# Patient Record
Sex: Female | Born: 1935 | Race: White | Hispanic: No | Marital: Married | Smoking: Former smoker
Health system: Southern US, Community
[De-identification: ages and names within clinical notes are randomized; demographics above are authoritative.]

## PROBLEM LIST (undated history)

## (undated) DIAGNOSIS — E7800 Pure hypercholesterolemia, unspecified: Secondary | ICD-10-CM

## (undated) DIAGNOSIS — J449 Chronic obstructive pulmonary disease, unspecified: Secondary | ICD-10-CM

## (undated) DIAGNOSIS — I10 Essential (primary) hypertension: Secondary | ICD-10-CM

## (undated) DIAGNOSIS — Z923 Personal history of irradiation: Secondary | ICD-10-CM

## (undated) DIAGNOSIS — C787 Secondary malignant neoplasm of liver and intrahepatic bile duct: Secondary | ICD-10-CM

## (undated) DIAGNOSIS — C801 Malignant (primary) neoplasm, unspecified: Secondary | ICD-10-CM

## (undated) DIAGNOSIS — Z9221 Personal history of antineoplastic chemotherapy: Secondary | ICD-10-CM

## (undated) DIAGNOSIS — R918 Other nonspecific abnormal finding of lung field: Secondary | ICD-10-CM

## (undated) HISTORY — PX: BACK SURGERY: SHX140

## (undated) HISTORY — DX: Chronic obstructive pulmonary disease, unspecified: J44.9

## (undated) HISTORY — DX: Personal history of antineoplastic chemotherapy: Z92.21

## (undated) HISTORY — PX: PORTACATH PLACEMENT: SHX2246

## (undated) HISTORY — DX: Essential (primary) hypertension: I10

## (undated) HISTORY — DX: Pure hypercholesterolemia, unspecified: E78.00

## (undated) HISTORY — PX: ABDOMINAL HYSTERECTOMY: SHX81

## (undated) HISTORY — DX: Personal history of irradiation: Z92.3

---

## 1999-05-07 ENCOUNTER — Encounter: Admission: RE | Admit: 1999-05-07 | Discharge: 1999-05-07 | Payer: Self-pay | Admitting: Gastroenterology

## 1999-05-07 ENCOUNTER — Encounter: Payer: Self-pay | Admitting: Gastroenterology

## 1999-10-21 ENCOUNTER — Encounter: Admission: RE | Admit: 1999-10-21 | Discharge: 1999-10-21 | Payer: Self-pay | Admitting: Gastroenterology

## 1999-10-21 ENCOUNTER — Encounter: Payer: Self-pay | Admitting: Gastroenterology

## 2000-11-02 ENCOUNTER — Encounter: Payer: Self-pay | Admitting: Gastroenterology

## 2000-11-02 ENCOUNTER — Encounter: Admission: RE | Admit: 2000-11-02 | Discharge: 2000-11-02 | Payer: Self-pay | Admitting: Gastroenterology

## 2001-03-22 ENCOUNTER — Other Ambulatory Visit: Admission: RE | Admit: 2001-03-22 | Discharge: 2001-03-22 | Payer: Self-pay

## 2001-05-12 ENCOUNTER — Encounter: Admission: RE | Admit: 2001-05-12 | Discharge: 2001-07-05 | Payer: Self-pay | Admitting: Orthopedic Surgery

## 2001-06-07 ENCOUNTER — Encounter: Admission: RE | Admit: 2001-06-07 | Discharge: 2001-06-07 | Payer: Self-pay

## 2001-12-01 ENCOUNTER — Encounter: Payer: Self-pay | Admitting: Family Medicine

## 2001-12-01 ENCOUNTER — Encounter: Admission: RE | Admit: 2001-12-01 | Discharge: 2001-12-01 | Payer: Self-pay | Admitting: Family Medicine

## 2001-12-08 ENCOUNTER — Encounter: Admission: RE | Admit: 2001-12-08 | Discharge: 2002-01-17 | Payer: Self-pay | Admitting: Family Medicine

## 2002-04-11 ENCOUNTER — Other Ambulatory Visit: Admission: RE | Admit: 2002-04-11 | Discharge: 2002-04-11 | Payer: Self-pay

## 2002-07-11 ENCOUNTER — Encounter: Admission: RE | Admit: 2002-07-11 | Discharge: 2002-07-11 | Payer: Self-pay

## 2003-08-09 ENCOUNTER — Encounter: Admission: RE | Admit: 2003-08-09 | Discharge: 2003-08-09 | Payer: Self-pay | Admitting: Obstetrics and Gynecology

## 2004-10-07 ENCOUNTER — Encounter: Admission: RE | Admit: 2004-10-07 | Discharge: 2004-10-07 | Payer: Self-pay | Admitting: Gastroenterology

## 2005-11-11 ENCOUNTER — Encounter: Admission: RE | Admit: 2005-11-11 | Discharge: 2005-11-11 | Payer: Self-pay | Admitting: Gastroenterology

## 2005-11-19 ENCOUNTER — Encounter: Admission: RE | Admit: 2005-11-19 | Discharge: 2005-11-19 | Payer: Self-pay | Admitting: Gastroenterology

## 2006-09-23 ENCOUNTER — Encounter: Admission: RE | Admit: 2006-09-23 | Discharge: 2006-09-23 | Payer: Self-pay | Admitting: Gastroenterology

## 2006-11-16 ENCOUNTER — Encounter: Admission: RE | Admit: 2006-11-16 | Discharge: 2006-11-16 | Payer: Self-pay | Admitting: Gastroenterology

## 2007-12-20 ENCOUNTER — Encounter: Admission: RE | Admit: 2007-12-20 | Discharge: 2007-12-20 | Payer: Self-pay | Admitting: Internal Medicine

## 2008-06-06 ENCOUNTER — Encounter: Admission: RE | Admit: 2008-06-06 | Discharge: 2008-06-06 | Payer: Self-pay | Admitting: Neurosurgery

## 2008-06-28 ENCOUNTER — Ambulatory Visit (HOSPITAL_COMMUNITY): Admission: RE | Admit: 2008-06-28 | Discharge: 2008-06-29 | Payer: Self-pay | Admitting: Neurosurgery

## 2008-09-19 ENCOUNTER — Ambulatory Visit (HOSPITAL_COMMUNITY): Admission: RE | Admit: 2008-09-19 | Discharge: 2008-09-19 | Payer: Self-pay | Admitting: Internal Medicine

## 2008-11-20 ENCOUNTER — Ambulatory Visit: Payer: Self-pay | Admitting: Vascular Surgery

## 2008-11-29 ENCOUNTER — Ambulatory Visit (HOSPITAL_COMMUNITY): Admission: RE | Admit: 2008-11-29 | Discharge: 2008-11-29 | Payer: Self-pay | Admitting: Vascular Surgery

## 2008-11-29 ENCOUNTER — Ambulatory Visit: Payer: Self-pay | Admitting: Vascular Surgery

## 2008-12-09 ENCOUNTER — Encounter: Payer: Self-pay | Admitting: Vascular Surgery

## 2008-12-09 ENCOUNTER — Inpatient Hospital Stay (HOSPITAL_COMMUNITY): Admission: RE | Admit: 2008-12-09 | Discharge: 2008-12-12 | Payer: Self-pay | Admitting: Vascular Surgery

## 2008-12-10 ENCOUNTER — Encounter: Payer: Self-pay | Admitting: Vascular Surgery

## 2009-01-01 ENCOUNTER — Ambulatory Visit: Payer: Self-pay | Admitting: Vascular Surgery

## 2009-05-28 ENCOUNTER — Ambulatory Visit: Payer: Self-pay | Admitting: Vascular Surgery

## 2009-09-24 ENCOUNTER — Encounter: Admission: RE | Admit: 2009-09-24 | Discharge: 2009-09-24 | Payer: Self-pay | Admitting: Internal Medicine

## 2009-11-28 ENCOUNTER — Encounter: Admission: RE | Admit: 2009-11-28 | Discharge: 2009-11-28 | Payer: Self-pay | Admitting: Gastroenterology

## 2009-12-25 ENCOUNTER — Ambulatory Visit: Payer: Self-pay | Admitting: Vascular Surgery

## 2010-03-19 ENCOUNTER — Ambulatory Visit: Payer: Self-pay | Admitting: Vascular Surgery

## 2010-09-28 LAB — APTT: aPTT: 29 seconds (ref 24–37)

## 2010-09-28 LAB — POCT I-STAT, CHEM 8
BUN: 14 mg/dL (ref 6–23)
Chloride: 106 mEq/L (ref 96–112)
Creatinine, Ser: 0.8 mg/dL (ref 0.4–1.2)
Potassium: 3.7 mEq/L (ref 3.5–5.1)
Sodium: 141 mEq/L (ref 135–145)
TCO2: 25 mmol/L (ref 0–100)

## 2010-09-28 LAB — BASIC METABOLIC PANEL
BUN: 4 mg/dL — ABNORMAL LOW (ref 6–23)
Chloride: 106 mEq/L (ref 96–112)
Creatinine, Ser: 0.8 mg/dL (ref 0.4–1.2)
Glucose, Bld: 127 mg/dL — ABNORMAL HIGH (ref 70–99)

## 2010-09-28 LAB — URINALYSIS, ROUTINE W REFLEX MICROSCOPIC
Bilirubin Urine: NEGATIVE
Nitrite: NEGATIVE
Urobilinogen, UA: 0.2 mg/dL (ref 0.0–1.0)

## 2010-09-28 LAB — COMPREHENSIVE METABOLIC PANEL WITH GFR
ALT: 24 U/L (ref 0–35)
AST: 22 U/L (ref 0–37)
Albumin: 4.2 g/dL (ref 3.5–5.2)
Alkaline Phosphatase: 66 U/L (ref 39–117)
BUN: 10 mg/dL (ref 6–23)
CO2: 25 meq/L (ref 19–32)
Calcium: 10.2 mg/dL (ref 8.4–10.5)
Chloride: 103 meq/L (ref 96–112)
Creatinine, Ser: 0.75 mg/dL (ref 0.4–1.2)
GFR calc non Af Amer: >60 mL/min
Glucose, Bld: 79 mg/dL (ref 70–99)
Potassium: 4.6 meq/L (ref 3.5–5.1)
Sodium: 140 meq/L (ref 135–145)
Total Bilirubin: 0.6 mg/dL (ref 0.3–1.2)
Total Protein: 6.8 g/dL (ref 6.0–8.3)

## 2010-09-28 LAB — TYPE AND SCREEN: Antibody Screen: NEGATIVE

## 2010-09-28 LAB — CBC
MCV: 93.8 fL (ref 78.0–100.0)
RBC: 4.12 MIL/uL (ref 3.87–5.11)
RBC: 4.78 MIL/uL (ref 3.87–5.11)

## 2010-09-28 LAB — ABO/RH: ABO/RH(D): O POS

## 2010-09-28 LAB — PROTIME-INR
INR: 0.9 (ref 0.00–1.49)
Prothrombin Time: 12.3 s (ref 11.6–15.2)

## 2010-10-05 LAB — CBC
HCT: 46 % (ref 36.0–46.0)
Hemoglobin: 15.2 g/dL — ABNORMAL HIGH (ref 12.0–15.0)
MCHC: 33 g/dL (ref 30.0–36.0)
MCV: 93.3 fL (ref 78.0–100.0)
RDW: 13.4 % (ref 11.5–15.5)

## 2010-11-03 NOTE — Assessment & Plan Note (Signed)
OFFICE VISIT   [name], [name]  DOB:  [birth year]                                       03/19/2010  ZOXWR#:[id number]   The patient returns for followup today.  She previously underwent left  common femoral endarterectomy and to left right femoral-femoral bypass  in June of 2010.  She returns today complaining of bilateral numbness,  tingling and burning in her feet each night.  She states that this has  been going on prior to her operation in 2010.  She has been on no prior  treatment for this.  She occasionally has night time cramps but really  denies any claudication symptoms in her lower extremities.  She denies  any rest pain.   REVIEW OF SYSTEMS:  She denies any shortness of breath or chest pain.   PHYSICAL EXAM:  Vital signs:  Blood pressure is 142/82 in the left arm,  heart rate 76 and regular, oxygen saturations 98% on room air.  HEENT:  Unremarkable.  Neck:  Has 2+ carotid pulses without bruit.  Chest:  Clear to auscultation.  Cardiac:  Regular rate and rhythm without  murmur.  Abdomen:  Soft, nontender, nondistended.  No masses.  Extremities:  She has 2+ radial, 2+ femoral and 2+ posterior tibial  pulses bilaterally.   In summary, the patient has bilateral burning sensation in her feet  which sounds consistent with neuropathy.  I started her on Neurontin  today 100 mg three times a day.  This can be titrated up over time if  necessary.  Hopefully this will provide her some relief.  She has  excellent pulse in her femoral-femoral bypass and good pulses in her  feet as well.  I do not believe the burning in her feet is vascular in  origin.  Hopefully the Neurontin will help with her symptoms overall.  She will follow up with a graft scan in July of 2012.     Janetta Hora. Fields, MD  Electronically Signed   CEF/MEDQ  D:  03/19/2010  T:  03/20/2010  Job:  [id number]   cc:   [name], M.D.

## 2010-11-03 NOTE — Op Note (Signed)
[name], [name]                ACCOUNT NO.:  [id number]   MEDICAL RECORD NO.:  [id number]          PATIENT TYPE:  INP   LOCATION:  3310                         FACILITY:  [facility]   PHYSICIAN:  Janetta Hora. Fields, MD  DATE OF BIRTH:  December 06, 1931   DATE OF PROCEDURE:  12/09/2008  DATE OF DISCHARGE:                               OPERATIVE REPORT   PROCEDURE:  1. Left common femoral endarterectomy.  2. Left-to-right femoral-femoral bypass.   PREOPERATIVE DIAGNOSIS:  Claudication, right leg.   POSTOPERATIVE DIAGNOSIS:  Claudication, right leg.   ANESTHESIA:  General.   ASSISTANT:  Jerold Coombe, PA-C   OPERATIVE FINDINGS:  An 8-mm Dacron graft.   SPECIMENS:  1. Plaque from left common femoral artery.  2. Incidental lymph node, left groin.   OPERATIVE DETAILS:  After obtaining informed consent, the patient was  taken to the operating room.  The patient was placed in the supine  position on the operating table.  After induction of general anesthesia  and endotracheal intubation, a Foley catheter was placed.  Next, the  patient was prepped and draped in the usual sterile fashion from the  umbilicus down to the knees bilaterally.  An incision was made in a  curvilinear fashion in the right groin and carried down through the  subcutaneous tissues and down to the level of the artery.  The artery  had no pulse within it on the right side.  There was some  atherosclerotic plaque posteriorly up near the inguinal ligament, but  the artery became softer in character with minimal plaque down near the  femoral bifurcation.  Superficial femoral and profunda femoris arteries  were dissected free circumferentially and vessel loops were placed  around these.  Attention was then turned to the left groin.  In similar  fashion, a curvilinear incision was made on the left groin and carried  down through the subcutaneous tissues and down to the level of the left  common femoral artery.  There  was a large incidental lymph node  overlying this and this was excised to sent pathology as specimen.  Common femoral artery was dissected free circumferentially.  It was a  fairly high takeoff of the profunda femoris artery and this was  dissected free circumferentially and vessel loop was placed around this.  Superficial femoral artery was also dissected free circumferentially and  vessel loop was placed around this.  Next, a subcutaneous tunnel was  created going over the pubic bone superficial to the fascia.  An 8-mm  Dacron graft was then brought through this.  The patient was then given  5000 units of intravenous heparin.  The patient was given an additional  3000 units of heparin during the case.  The left common femoral artery  was controlled proximally with a Cooley clamp.  The profunda femoris and  superficial femoral arteries were controlled with vessel loops.  A  longitudinal opening was made in the left common femoral artery.  There  was a large amount of posterior plaque and this was obstructing  approximately 50% of the lumen.  This side  of the left common femoral  endarterectomy would need to be performed to prevent any narrowing of  the left common femoral artery especially since this was the donor  artery.  Endarterectomy was begun at about the level of the inguinal  ligament and carried down approximately 0.5 cm into the left superficial  femoral artery.  A good endpoint was obtained around the orifice of the  profunda on the left side.  There was a small shelf posteriorly on the  left SFA and this was tacked with several 7-0 Prolene sutures.  Next, a  Dacron patch was made from fashioning a portion of the 8-mm graft.  This  was done by opening the graft longitudinally and trimming the fashion.  This was then sewn with a running 6-0 Prolene suture.  Just prior to  completion of anastomosis, everything was backbled, forebled, and  thoroughly flushed.  Anastomosis was  secured.  A few repair sutures were  placed.  There was good pulse within the common femoral artery around  the left side.  There was good Doppler flow within the superficial  femoral and profunda femoris arteries on the left side.  Next, the  artery was re-controlled in the similar fashion.  A longitudinal opening  was made in the anterior portion of the patch.  An 8-mm Dacron graft was  then sewn end of graft to side of patch using a running 5-0 Prolene  suture.  Just prior to completion of anastomosis, this was forebled,  backbled, and thoroughly flushed.  Anastomosis was secured.  Clamps were  released.  There was good pulsatile flow in the common femoral. profunda  femoris, superficial femoral, and the new femoral-femoral bypass graft  immediately.  Next, attention was turned to the right groin.  The right  common femoral artery was controlled proximally with Cooley clamp.  The  profunda femoris and superficial femoral arteries were controlled with  vessel loops.  A longitudinal opening was made in the right common  femoral artery above the femoral bifurcation.  The graft was then cut to  length and beveled.  The graft was then sewn end graft to side of artery  using a running 5-0 Prolene suture.  Just prior to completion of  anastomosis, this was forebled, backbled, and thoroughly flushed.  Anastomosis was secured.  Clamps were released.  There was pulsatile  flow in the graft immediately.  There was good Doppler flow in the  profunda femoris and superficial femoral artery.  This augmented  approximately 75% with un-clamping of the graft.  Next, hemostasis was  obtained.  The patient was given 50 mg of protamine.  Both groins were  closed with running 2-0 and 3-0 Vicryl suture in the subcutaneous and  deep layers.  Skin was then closed with 4-0 Vicryl subcuticular stitch  bilaterally.  The patient tolerated the procedure well and there were no  complications.  Instrument, sponge, and  needle counts were correct at  the end of the case.  The patient's feet were pink and warm at the end  of the case.  The patient was taken to the recovery room in stable  condition.      Janetta Hora. Fields, MD  Electronically Signed     CEF/MEDQ  D:  12/09/2008  T:  12/10/2008  Job:  [id number]

## 2010-11-03 NOTE — Procedures (Signed)
BYPASS GRAFT EVALUATION   INDICATION:  Follow up femoral-to-femoral bypass graft.   HISTORY:  Diabetes:  No.  Cardiac:  No.  Hypertension:  Yes.  Smoking:  Yes.  Previous Surgery:  Left-to-right femoral-femoral bypass grafts on  12/09/08.   SINGLE LEVEL ARTERIAL EXAM                               RIGHT              LEFT  Brachial:                    172                164  Anterior tibial:             165                169  Posterior tibial:            168                168  Peroneal:  Ankle/brachial index:        0.98               0.98   PREVIOUS ABI:  Date: 01/01/09  RIGHT:  1.04  LEFT:  1.09   LOWER EXTREMITY BYPASS GRAFT DUPLEX EXAM:   DUPLEX:  Patent left-to-right femoral-to-femoral bypass graft with  biphasic waveforms proximal, within, and distal to the graft.   IMPRESSION:  1. Normal ankle brachial indices bilaterally.  2. Patent femoral-to-femoral bypass graft with no focal stenosis noted      within.     ___________________________________________  Janetta Hora. Fields, MD   CB/MEDQ  D:  05/28/2009  T:  05/28/2009  Job:  [id number]

## 2010-11-03 NOTE — Assessment & Plan Note (Signed)
OFFICE VISIT   [name], [name]  DOB:  06/25/1931                                       01/01/2009  PPIRJ#:[id number]   The patient returns for followup today.  She underwent left common  femoral endarterectomy and left to right femoral-femoral bypass on June  21.  She has recovered well from this.  She presents to the office today  for further followup.  She states her incisions are healing well.  She  has a slight separation at the inferior aspect of the left and otherwise  no complaints.  She states that her walking distance has improved and  wants to return to her normal activities.   PHYSICAL EXAM:  Today blood pressure is 149/79 in the right arm, pulse  is 65 and regular.  Both groin incisions are well-healed except for a 1-  2 mm area at the inferior aspect of the left incision which is slightly  separated.  There is no drainage from this.  There was no erythema.  She  has a palpable graft pulse in the femoral-femoral bypass.  Feet are  pink, warm and well-perfused bilaterally.   She had bilateral ABIs performed today which were 1.04 on the right and  1.09 on the left.  This is significantly improved from the 0.65 that she  had on the right preoperatively.   Overall the patient is doing well.  I have told her that she could  return to all of her normal activities at this point.  She will continue  to wash both incisions with soap and water and these should continue to  heal spontaneously.  She will follow up on our graft surveillance  protocol.   Janetta Hora. Fields, MD  Electronically Signed   CEF/MEDQ  D:  01/01/2009  T:  01/02/2009  Job:  [id number]   cc:   [name], M.D.

## 2010-11-03 NOTE — Assessment & Plan Note (Signed)
OFFICE VISIT   [name], [name]  DOB:  [birth year]                                       11/20/2008  ZOXWR#:[id number]   The patient is a 77 year old female referred for right leg pain.  She  stated that the pain started in April of 2010.  She apparently had  lumbar spine surgery 4 months ago and was having numbness, tingling and  pain in her right leg at that time.  However, she stated that the pain  had resolved completely and this is a new pain that has only been  present for the last 2 months.  She currently experiences claudication  symptoms in the right leg at one to two blocks.  She has no rest pain.  She is a current smoker and smokes approximately 3 cigarettes per day.  She is currently cutting back on her smoking and is on a Nicorette gum  program.   Other atherosclerotic risk factors include elevated cholesterol.  She  denies history of diabetes, hypertension or coronary disease.   PAST SURGICAL HISTORY:  She had lumbar spine surgery 4 months ago.  She  has also had a hysterectomy.   PAST MEDICAL HISTORY:  Otherwise fairly unremarkable.   MEDICATIONS:  1. Crestor 10 mg every other day.  2. Aspirin 81 mg once a day.  3. Vitamin D 50,000 units twice a week.  4. Calcium 600 mg once a day.  5. Vitamin C 500 mg once a day.  6. Motrin p.r.n.   ALLERGIES:  She has allergies listed to Aleve and Zyban both of which  cause a rash.   FAMILY HISTORY:  Unremarkable.   SOCIAL HISTORY:  She is married and has two children.  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  Smoking history is as listed  above.  She does not consume alcohol regularly.   REVIEW OF SYSTEMS:  She is 5 feet 4 inches, 147 pounds.  She has had  some...   PHYSICAL EXAMINATION:  Vital signs:  Blood pressure is 153/85 in the  right arm, pulse is 75 and regular.  HEENT:  Unremarkable.  Neck:  Has  2+ carotid pulses without bruit.  Chest:  Clear to auscultation.  Cardiac:   Exam is regular rate and rhythm without murmur.  Abdomen:  Abdomen is soft, nontender, nondistended.  No masses.  Extremities:  She  has 2+ brachial and radial pulses bilaterally.  She has a 2+ left  femoral, popliteal, dorsalis pedis and posterior tibial pulse.  She has  absent right femoral, popliteal, posterior tibial and dorsalis pedis  pulse.  Both feet are pink, warm and well-perfused.   She had bilateral ABIs performed today which were 1.08 on the left and  0.65 on the right.   The patient has an exam consistent with iliac occlusive disease on the  right side.  She currently has short distance claudication symptoms.  I  believe the best option for her would be abdominal aortogram/lower  extremity runoff, possible angioplasty and stenting of her iliac artery  if necessary.  Risks, benefits, possible complications and procedure  details were explained to the patient today for aortogram/lower  extremity runoff and possible angioplasty and stenting.  She understands  and agrees to proceed.  She apparently had a family reunion coming up  soon.  She will either have her arteriogram on June 4 or June 11 and  will call us back later today regarding that.   Janetta Hora. Fields, MD  Electronically Signed   CEF/MEDQ  D:  11/20/2008  T:  11/21/2008  Job:  [id number]   cc:   [name], M.D.

## 2010-11-03 NOTE — Op Note (Signed)
[name], MAULDEN                ACCOUNT NO.:  [id number]   MEDICAL RECORD NO.:  [id number]          PATIENT TYPE:  AMB   LOCATION:  SDS                          FACILITY:  [facility]   PHYSICIAN:  Donalee Citrin, M.D.        DATE OF BIRTH:  1931-10-27   DATE OF PROCEDURE:  06/28/2008  DATE OF DISCHARGE:                               OPERATIVE REPORT   PREOPERATIVE DIAGNOSES:  Lumbar spinal stenosis with right-sided L4  radiculopathy and spinal stenosis at L3-4.   PROCEDURE:  Decompressive laminectomy at L3-4 with microdissection of  the right L4 nerve root, microscopic diskectomy.   SURGEON:  Donalee Citrin, MD   ASSISTANT:  Kathaleen Maser. Pool, MD   ANESTHESIA:  General endotracheal.   HISTORY OF PRESENT ILLNESS:  The patient is a very pleasant 77 year old  female who has had several years of progressive worsening back and  predominantly right leg pain radiating through her hip down the outside  of her right thigh to the front of her right shin, consistent with L4  nerve root pattern.  The patient's failed all forms of conservative  treatment epidural steroid injection, physical therapy, and time.  An  MRI scan showed progression with disk herniation and severe facet  arthropathy, and synovial cyst at L3-4 on the right.  The patient  entirely right-sided symptoms and was recommended a unilateral  decompressive laminectomy.  Risks and benefits of the operation were  discussed; the patient understood and agreed to proceed forward.   DESCRIPTION OF PROCEDURE:  The patient was brought to the OR and induced  under anesthesia, positioned prone on the Wilson frame.  Back was  prepped and draped in the routine sterile fashion.  Preop incision was  localized at the appropriate level.  So after infiltration of 10 mL of  lidocaine with epinephrine, a midline incision was made.  Bovie  electrocautery was used to take down and subperiosteal dissection was  carried down to the lamina of L3 and L4.  However,  intraoperative x-ray  confirmed localization of this to be at 2 and 3 disk space.  Attention  was then taken to 1 disk space below this and the retractor was  repositioned and a high speed drill was used to drill down to the  inferior aspect level of L3.  Medial facet complexes and spinous process  at L3 was actually removed due to the fact the synovial cyst extended up  to the midline and was felt to be safer to remove the spinous process.  So, central decompression was began to identify the ligamentum flavum  and then extension was taken out laterally and dividing the medial facet  complex.  Then, nerve hook was used to remove the ligamentum flavum  __________ thecal sac.  The thecal sac was remarkably redundant,  hypertrophied and floppy.  This was teased away from a degenerative  synovial cyst on the right.  This was all removed in piecemeal fashion  and the ligament was underbitten and aggressively to gain access to  lateral margin of the disk space.  The disk space  was then inspected and  noted to be bulging and partially herniated, so this was incised with  #15-blade scalpel and the disk space was cleaned out radically with  pituitary rongeurs.  Then, at the end of the diskectomy, the remainder  of the lateral canal, L4 nerve root, and L4 pedicle was identified.  The  L4 nerve root was widely decompressed, explored with a coronary dilator  and angled hockey stick and noted to be widely patent.  The wound was  then copiously irrigated.  Meticulous hemostasis was maintained.  Gelfoam was laid on top of the dura.  The muscle fascia reapproximated  in  layers with interrupted Vicryl, and the skin was closed with a running 4-  0 subcuticular.  Benzoin and Steri-Strips were applied.  The patient  went to the recovery room in stable condition.  At the end of the case,  sponge and instrument count were correct.           ______________________________  Donalee Citrin, M.D.     GC/MEDQ   D:  06/28/2008  T:  06/28/2008  Job:  [id number]

## 2010-11-03 NOTE — Op Note (Signed)
[name], [name]                ACCOUNT NO.:  [id number]   MEDICAL RECORD NO.:  [id number]          PATIENT TYPE:  AMB   LOCATION:  SDS                          FACILITY:  [facility]   PHYSICIAN:  Janetta Hora. Fields, MD  DATE OF BIRTH:  1931/12/30   DATE OF PROCEDURE:  11/29/2008  DATE OF DISCHARGE:  11/29/2008                               OPERATIVE REPORT   PREOPERATIVE DIAGNOSIS:  Claudication, right leg.   POSTOPERATIVE DIAGNOSIS:  Claudication, right leg.   ANESTHESIA:  Local.   OPERATIVE DETAILS:  After obtaining informed consent, the patient was  taken to the PV lab.  The patient was placed supine position on the  angio table.  Both groins were prepped and draped in usual sterile  fashion.  Local anesthesia was infiltrated over the left common femoral  artery.  An introducer needle was used to cannulate the left common  femoral artery and an 0.035 Wholey wire was advanced into the abdominal  aorta under fluoroscopic guidance.  Next, a 5-French sheath was placed  over the guidewire in the left common femoral artery.  This was  thoroughly flushed with heparinized saline.  A 5-French pigtail catheter  was then placed over the guidewire and on the abdominal aorta.  Abdominal aortogram was obtained.  This shows a patent infrarenal  abdominal aorta.  There was mild right renal artery stenosis with  approximately 25% stenosis near the origin.  The left renal artery has a  50% diffuse tubular stenosis approximately its first 2 cm.  There was  some calcification at the aortic bifurcation.  Next, the pigtail  catheter was pulled down to the level of the aortic bifurcation and a  pelvic angiogram was obtained.  This shows occlusion of the right common  iliac artery approximately 1 cm after its origin.  The left common iliac  artery was patent.  The left external and internal iliac artery was  patent.  The right external and internal iliac artery were patent and  fills via pelvic  collaterals.  Lower extremity runoff views were also  performed.  This shows the left and right common femoral, profunda  femoris, and superficial femoral arteries were all widely patent.  Popliteal artery was patent bilaterally.  There was three-vessel runoff  to the feet bilaterally.  Although, the tibial vessels were poorly  opacified on the right leg most likely secondary to limited flow from  the chronic right common iliac artery occlusion.   Next, the pigtail catheter was pulled back over a guidewire.  The 5-  French sheath was left in place to be pulled on the holding area.  This  was thoroughly flushed with heparinized saline prior to leaving the PV  lab.   OPERATIVE FINDINGS:  1. Right common iliac artery occlusion.  2. Three-vessel runoff bilaterally.  3. Bilateral renal artery stenoses, right 25% and left 50%.      Janetta Hora. Fields, MD  Electronically Signed    CEF/MEDQ  D:  11/29/2008  T:  11/30/2008  Job:  [id number]

## 2010-11-03 NOTE — Discharge Summary (Signed)
[name], [name]                ACCOUNT NO.:  [id number]   MEDICAL RECORD NO.:  [id number]          PATIENT TYPE:  INP   LOCATION:  2028                         FACILITY:  [facility]   PHYSICIAN:  Janetta Hora. Fields, MD  DATE OF BIRTH:  16-Sep-1931   DATE OF ADMISSION:  12/09/2008  DATE OF DISCHARGE:  12/12/2008                               DISCHARGE SUMMARY   FINAL DISCHARGE DIAGNOSES:  1. Peripheral vascular disease with left iliac-occlusive disease on      the right.  2. Lumbar spine surgery 4 months ago.  3. History of hysterectomy.  4. Tobacco abuse.  5. Dyslipidemia.   PROCEDURES PERFORMED:  Left common femoral endarterectomy and left-to-  right femoral-femoral bypass grafting by Dr. Darrick Penna on December 09, 2008.   COMPLICATIONS:  None.   CONDITION AT DISCHARGE:  Stable, improving.   DISCHARGE MEDICATIONS:  She is instructed to resume all previously  taking medications consisting of,  1. Crestor 10 mg q.p.m.  2. Vitamin D 50,000 International Units p.o. q.p.m.  3. Vitamin C 500 mg p.o. q.p.m.  4. Aspirin 81 mg p.o. q.a.m.  5. Motrin p.r.n.  6. Calcium plus D q.p.m.  7. Reclast 1 year.  8. Percocet 5/325 one p.o. q.4 h. p.r.n. pain, total #30 were given.   DISPOSITION:  She is being discharged home in stable condition with her  wounds healing well.  She is given careful instructions regarding the  care of her wounds and activity level.  She is scheduled to see Dr.  Darrick Penna in 2 weeks for followup.  She will have the PABIs done at that  time.  The office will make the arrangements for the visit.   BRIEF IDENTIFYING STATEMENT:  For complete details, please refer to the  typed history and physical.  Briefly, this very pleasant 77 year old  woman was referred to Dr. Arbie Cookey for right leg claudication.  He  evaluated her and found her to be a suitable candidate for a left-to-  right femoral-femoral bypass grafting.  She was informed of the risks  and benefits of the procedure and  after careful consideration, elected  to proceed with surgery.   HOSPITAL COURSE:  Preoperative workup was completed as an outpatient.  She was brought in through same-day surgery and underwent the  aforementioned revascularization procedure.  For complete details,  please refer the typed operative report.  The procedure was without  complications.  She was returned to the Post Anesthesia Care Unit and  extubated.  Following stabilization, she was transferred to a bed on a  surgical step-down unit.  She was observed overnight.  She was stable  during this period and was transferred to a bed on a surgical  convalescent floor.   Following transfer to the floor, her activity level was increased as  tolerated.  The following day, she was walking and improving with  physical therapy.  She was desirous of discharge.  She was found to be  stable and was discharged home.      Wilmon Arms, PA      Janetta Hora. Darrick Penna, MD  Electronically Signed    KEL/MEDQ  D:  12/12/2008  T:  12/12/2008  Job:  [id number]

## 2010-11-11 ENCOUNTER — Other Ambulatory Visit: Payer: Self-pay | Admitting: Internal Medicine

## 2010-11-11 DIAGNOSIS — Z1231 Encounter for screening mammogram for malignant neoplasm of breast: Secondary | ICD-10-CM

## 2010-11-30 ENCOUNTER — Ambulatory Visit
Admission: RE | Admit: 2010-11-30 | Discharge: 2010-11-30 | Disposition: A | Discharge status: Home / Self Care | Payer: Medicare Other | Source: Ambulatory Visit | Attending: Internal Medicine | Admitting: Internal Medicine

## 2010-11-30 DIAGNOSIS — Z1231 Encounter for screening mammogram for malignant neoplasm of breast: Secondary | ICD-10-CM

## 2010-12-15 ENCOUNTER — Other Ambulatory Visit: Payer: Self-pay | Admitting: Internal Medicine

## 2010-12-15 DIAGNOSIS — Z0189 Encounter for other specified special examinations: Secondary | ICD-10-CM

## 2010-12-17 ENCOUNTER — Ambulatory Visit
Admission: RE | Admit: 2010-12-17 | Discharge: 2010-12-17 | Disposition: A | Discharge status: Home / Self Care | Payer: Medicare Other | Source: Ambulatory Visit | Attending: Internal Medicine | Admitting: Internal Medicine

## 2010-12-17 ENCOUNTER — Inpatient Hospital Stay: Admission: RE | Admit: 2010-12-17 | Payer: Medicare Other | Source: Ambulatory Visit

## 2010-12-17 DIAGNOSIS — Z0189 Encounter for other specified special examinations: Secondary | ICD-10-CM

## 2010-12-17 MED ORDER — IOHEXOL 300 MG/ML  SOLN
75.0000 mL | Freq: Once | INTRAMUSCULAR | Status: AC | PRN
  Administered 2010-12-17: 75 mL via INTRAVENOUS

## 2010-12-20 DIAGNOSIS — C801 Malignant (primary) neoplasm, unspecified: Secondary | ICD-10-CM

## 2010-12-20 HISTORY — DX: Malignant (primary) neoplasm, unspecified: C80.1

## 2010-12-21 ENCOUNTER — Encounter (INDEPENDENT_AMBULATORY_CARE_PROVIDER_SITE_OTHER): Payer: Medicare Other

## 2010-12-21 DIAGNOSIS — I70219 Atherosclerosis of native arteries of extremities with intermittent claudication, unspecified extremity: Secondary | ICD-10-CM

## 2010-12-21 DIAGNOSIS — Z48812 Encounter for surgical aftercare following surgery on the circulatory system: Secondary | ICD-10-CM

## 2010-12-24 ENCOUNTER — Encounter: Payer: Medicare Other | Admitting: Internal Medicine

## 2010-12-24 ENCOUNTER — Encounter (INDEPENDENT_AMBULATORY_CARE_PROVIDER_SITE_OTHER): Payer: Medicare Other

## 2010-12-24 ENCOUNTER — Other Ambulatory Visit: Payer: Self-pay | Admitting: Thoracic Surgery

## 2010-12-24 DIAGNOSIS — D381 Neoplasm of uncertain behavior of trachea, bronchus and lung: Secondary | ICD-10-CM

## 2010-12-24 DIAGNOSIS — R918 Other nonspecific abnormal finding of lung field: Secondary | ICD-10-CM

## 2010-12-24 NOTE — Letter (Signed)
December 24, 2010  [name], MD [address] [place], [place] [address]  Re:  [name], [name]                DOB:  1932/04/20  Dear Dr. Clelia Croft:  I saw the patient in the office today.  This 77 year old patient was found to have no abnormality, left upper lobe.  She quit smoking 3 years ago, but smoked for many years.  The CT scan revealed a lesion in the left upper lobe posterior segment that was new and was 2.5 x 2.4 lesion. It was in the inferior aspect of the left upper lobe.  She has a 1.1 precarinal node and another mediastinal node that is 1.1 cm.  She had no fever, chills, excessive sputum.  PAST MEDICAL HISTORY:  Significant for hypertension, osteoporosis, chronic obstructive pulmonary disease, and hypercholesterolemia.  Her medications include losartan, hydrochlorothiazide, tramadol, Wellbutrin, Aleve, and vitamin D, although ALEVE causes to have a rash.  FAMILY HISTORY:  Noncontributory.  SOCIAL HISTORY:  She is married, comes in today with her husband.  Quit smoking 3 years ago, had 50 pack year history.  She is not drinking alcohol on a regular basis.  REVIEW OF SYSTEMS:  She is 158 pounds, she is 5 feet 4 inches. GENERAL:  Her weight has been stable. CARDIAC:  She has shortness of breath with exertion. PULMONARY:  See history of present illness. GI:  Reflux. GU:  No kidney disease, dysuria, or frequent urination. VASCULAR:  No claudication, DVT, or TIAs. NEUROLOGICAL:  No dizziness, headaches, blackouts, seizures. MUSCULOSKELETAL:  Arthritis. PSYCHIATRIC:  No depression or nervous. EYE AND ENT:  She has cataracts and will have surgery.  No change in her hearing. HEMATOLOGICAL:  No problems with bleeding, clotting disorders, or anemia.  PHYSICAL EXAMINATION:  Her blood pressure is 113/70, pulse 81, respirations 16, sats were 95%, her height is 5 feet 3 inches, 158 pounds.  Head, eyes, ears, nose, and throat are unremarkable except for the cataracts.  Eyes:   Pupils equal and reactive to light and accommodation.  Neck is supple without thyromegaly.  There is no supraclavicular or axillary adenopathy.  Chest is clear to auscultation and percussion.  Heart:  Regular sinus rhythm.  No murmurs.  Abdomen: Soft.  There is no hepatosplenomegaly.  Extremities:  Pulses are 2+. There is no clubbing or edema.  Neurological:  She is oriented x3. Sensory and motor intact.  Cranial nerves intact.  I feel that the patient is to be worked for non-small cell lung cancer, it is a fairly difficult place to biopsy, but we will proceed with the bronchoscopy, we will proceed with a PET scan, brain scan, and pulmonary function test with DLCO and based upon this, I hope to be able to do fibrobronchoscopy with endobronchial ultrasound and/or electromagnetic navigation or possible mediastinoscopy.  I appreciate the opportunity of seeing the patient.  Sincerely,  Ines Bloomer, M.D. Electronically Signed  DPB/MEDQ  D:  12/24/2010  T:  12/24/2010  Job:  [id number]

## 2010-12-29 ENCOUNTER — Ambulatory Visit (HOSPITAL_COMMUNITY)
Admission: RE | Admit: 2010-12-29 | Discharge: 2010-12-29 | Disposition: A | Discharge status: Home / Self Care | Payer: Medicare Other | Source: Ambulatory Visit | Attending: Thoracic Surgery | Admitting: Thoracic Surgery

## 2010-12-29 ENCOUNTER — Encounter (HOSPITAL_COMMUNITY): Payer: Self-pay

## 2010-12-29 ENCOUNTER — Encounter (HOSPITAL_COMMUNITY)
Admission: RE | Admit: 2010-12-29 | Discharge: 2010-12-29 | Disposition: A | Discharge status: Home / Self Care | Payer: Medicare Other | Source: Ambulatory Visit | Attending: Thoracic Surgery | Admitting: Thoracic Surgery

## 2010-12-29 DIAGNOSIS — R918 Other nonspecific abnormal finding of lung field: Secondary | ICD-10-CM

## 2010-12-29 DIAGNOSIS — R599 Enlarged lymph nodes, unspecified: Secondary | ICD-10-CM | POA: Insufficient documentation

## 2010-12-29 DIAGNOSIS — K5730 Diverticulosis of large intestine without perforation or abscess without bleeding: Secondary | ICD-10-CM | POA: Insufficient documentation

## 2010-12-29 DIAGNOSIS — I672 Cerebral atherosclerosis: Secondary | ICD-10-CM | POA: Insufficient documentation

## 2010-12-29 DIAGNOSIS — J984 Other disorders of lung: Secondary | ICD-10-CM | POA: Insufficient documentation

## 2010-12-29 DIAGNOSIS — J438 Other emphysema: Secondary | ICD-10-CM | POA: Insufficient documentation

## 2010-12-29 DIAGNOSIS — I2510 Atherosclerotic heart disease of native coronary artery without angina pectoris: Secondary | ICD-10-CM | POA: Insufficient documentation

## 2010-12-29 HISTORY — DX: Other nonspecific abnormal finding of lung field: R91.8

## 2010-12-29 MED ORDER — FLUDEOXYGLUCOSE F - 18 (FDG) INJECTION
18.8000 | Freq: Once | INTRAVENOUS | Status: AC | PRN
  Administered 2010-12-29: 18.8 via INTRAVENOUS

## 2010-12-29 MED ORDER — IOHEXOL 300 MG/ML  SOLN
100.0000 mL | Freq: Once | INTRAMUSCULAR | Status: AC | PRN
  Administered 2010-12-29: 100 mL via INTRAVENOUS

## 2010-12-30 ENCOUNTER — Other Ambulatory Visit: Payer: Self-pay | Admitting: Thoracic Surgery

## 2010-12-30 ENCOUNTER — Ambulatory Visit (HOSPITAL_COMMUNITY)
Admission: RE | Admit: 2010-12-30 | Discharge: 2010-12-30 | Disposition: A | Discharge status: Home / Self Care | Payer: Medicare Other | Source: Ambulatory Visit | Attending: Thoracic Surgery | Admitting: Thoracic Surgery

## 2010-12-30 ENCOUNTER — Encounter (HOSPITAL_COMMUNITY)
Admission: RE | Admit: 2010-12-30 | Discharge: 2010-12-30 | Disposition: A | Discharge status: Home / Self Care | Payer: Medicare Other | Source: Ambulatory Visit | Attending: Thoracic Surgery | Admitting: Thoracic Surgery

## 2010-12-30 DIAGNOSIS — Z01812 Encounter for preprocedural laboratory examination: Secondary | ICD-10-CM | POA: Insufficient documentation

## 2010-12-30 DIAGNOSIS — Z01811 Encounter for preprocedural respiratory examination: Secondary | ICD-10-CM

## 2010-12-30 DIAGNOSIS — Z01818 Encounter for other preprocedural examination: Secondary | ICD-10-CM | POA: Insufficient documentation

## 2010-12-30 DIAGNOSIS — J984 Other disorders of lung: Secondary | ICD-10-CM | POA: Insufficient documentation

## 2010-12-30 LAB — COMPREHENSIVE METABOLIC PANEL
BUN: 14 mg/dL (ref 6–23)
Calcium: 9.5 mg/dL (ref 8.4–10.5)
GFR calc Af Amer: >60 mL/min (ref >60)
Glucose, Bld: 98 mg/dL (ref 70–99)
Total Protein: 6.9 g/dL (ref 6.0–8.3)

## 2010-12-30 LAB — TYPE AND SCREEN: ABO/RH(D): O POS

## 2010-12-30 LAB — SURGICAL PCR SCREEN: MRSA, PCR: NEGATIVE

## 2010-12-30 LAB — CBC
MCV: 88.2 fL (ref 78.0–100.0)
Platelets: 259 10*3/uL (ref 150–400)
RBC: 4.93 MIL/uL (ref 3.87–5.11)
WBC: 8.4 10*3/uL (ref 4.0–10.5)

## 2010-12-30 LAB — PROTIME-INR: Prothrombin Time: 12.9 seconds (ref 11.6–15.2)

## 2011-01-01 ENCOUNTER — Other Ambulatory Visit: Payer: Self-pay | Admitting: Thoracic Surgery

## 2011-01-01 ENCOUNTER — Ambulatory Visit (HOSPITAL_COMMUNITY): Payer: Medicare Other

## 2011-01-01 ENCOUNTER — Ambulatory Visit (HOSPITAL_COMMUNITY)
Admission: RE | Admit: 2011-01-01 | Discharge: 2011-01-01 | Disposition: A | Discharge status: Home / Self Care | Payer: Medicare Other | Source: Ambulatory Visit | Attending: Thoracic Surgery | Admitting: Thoracic Surgery

## 2011-01-01 DIAGNOSIS — C3410 Malignant neoplasm of upper lobe, unspecified bronchus or lung: Secondary | ICD-10-CM

## 2011-01-01 DIAGNOSIS — Z01812 Encounter for preprocedural laboratory examination: Secondary | ICD-10-CM | POA: Insufficient documentation

## 2011-01-01 DIAGNOSIS — R222 Localized swelling, mass and lump, trunk: Secondary | ICD-10-CM | POA: Insufficient documentation

## 2011-01-01 DIAGNOSIS — Z87891 Personal history of nicotine dependence: Secondary | ICD-10-CM | POA: Insufficient documentation

## 2011-01-01 DIAGNOSIS — Z01818 Encounter for other preprocedural examination: Secondary | ICD-10-CM | POA: Insufficient documentation

## 2011-01-01 DIAGNOSIS — I10 Essential (primary) hypertension: Secondary | ICD-10-CM | POA: Insufficient documentation

## 2011-01-01 DIAGNOSIS — R599 Enlarged lymph nodes, unspecified: Secondary | ICD-10-CM | POA: Insufficient documentation

## 2011-01-04 NOTE — Op Note (Signed)
  NAMECRISTA, NUON                ACCOUNT NO.:  [id number]  MEDICAL RECORD NO.:  [id number]  LOCATION:                                 FACILITY:  PHYSICIAN:  Ines Bloomer, M.D. DATE OF BIRTH:  [birth year]  DATE OF PROCEDURE: DATE OF DISCHARGE:                              OPERATIVE REPORT   PREOPERATIVE DIAGNOSIS:  Right upper lobe mass with mediastinal adenopathy.  POSTOPERATIVE DIAGNOSIS:  Probable stage IIIA non-small cell lung cancer.  SURGEON:  Ines Bloomer, MD  General anesthesia.  OPERATION PERFORMED:  Fiberoptic bronchoscopy with endobronchial ultrasound and electromagnetic navigation.  After general anesthesia, the video bronchoscope was passed through the endotracheal tube across in the midline.  The left mainstem, left upper lobe, and left lower lobe orifices were normal.  The right mainstem, right upper lobe, right middle lobe, and right lower lobe orifices were normal.  No endobronchial lesions were seen.  We then proceeded with passing the endobronchial ultrasound scope and identified a 7, 10R, and a 4R nodes.  We did 6 passes by passing the sheath of the working channel so that we could see it and then under endoscopic ultrasonic guidance, biopsying the 4R, 10R, and 7 nodes doing 2 aspirations of each.  We then put the needle back into the sheath and removed the needle with the sheath.  After this had been done, we then did automatic registration with the locatable guide probe with electromagnetic navigation.  We navigated to the right upper lobe into the anterior segment and then got within 1.2-1.3 cm of the lesion and did multiple brushings, at least 4 brushings, one needle aspirate of her lesion and as well as biopsies of the lesion.  The patient tolerated the procedures.  Actually, this had been done.  We then removed the locatable guide with the extended working channel and then removed the scope.  The patient returned to recovery room in  stable condition.     Ines Bloomer, M.D.     DPB/MEDQ  D:  01/01/2011  T:  01/02/2011  Job:  [id number]  Electronically Signed by Jovita Gamma M.D. on 01/04/2011 04:03:40 PM

## 2011-01-05 ENCOUNTER — Ambulatory Visit (INDEPENDENT_AMBULATORY_CARE_PROVIDER_SITE_OTHER): Payer: Medicare Other | Admitting: Thoracic Surgery

## 2011-01-05 DIAGNOSIS — C3490 Malignant neoplasm of unspecified part of unspecified bronchus or lung: Secondary | ICD-10-CM

## 2011-01-05 NOTE — Letter (Signed)
January 05, 2011  [name], MD [address] [place], [place] [address]  Re:  [name], [name]                DOB:  Jan 16, 1932  Dear Gala Romney,  I appreciate the opportunity of seeing the patient.  She unfortunately has a stage IIIA non-small-cell adenocarcinoma.  We got a PET scan and brain scan and she underwent bronchoscopy with endobronchial ultrasound as well as electromagnetic navigation and the biopsies were all positive for adenocarcinoma.  We have sent for EGFR and ALK testing.  Her blood pressure is 120/70, pulse 86, respirations 18, and sats were 90%.  I informed her family of the diagnosis and recommended that she get radiation and chemotherapy.  She will return on Thursday for our [facility] to see Dr. Arbutus Ped as well as one of the radiation oncologists.  I appreciate the opportunity of seeing the patient.  Sincerely,  Ines Bloomer, M.D. Electronically Signed  DPB/MEDQ  D:  01/05/2011  T:  01/05/2011  Job:  [id number]

## 2011-01-07 ENCOUNTER — Ambulatory Visit (HOSPITAL_BASED_OUTPATIENT_CLINIC_OR_DEPARTMENT_OTHER): Payer: Medicare Other | Admitting: Internal Medicine

## 2011-01-07 DIAGNOSIS — C3430 Malignant neoplasm of lower lobe, unspecified bronchus or lung: Secondary | ICD-10-CM

## 2011-01-08 ENCOUNTER — Ambulatory Visit
Admission: RE | Admit: 2011-01-08 | Discharge: 2011-01-08 | Disposition: A | Discharge status: Home / Self Care | Payer: Medicare Other | Source: Ambulatory Visit | Attending: Radiation Oncology | Admitting: Radiation Oncology

## 2011-01-08 DIAGNOSIS — R5381 Other malaise: Secondary | ICD-10-CM | POA: Insufficient documentation

## 2011-01-08 DIAGNOSIS — Z510 Encounter for antineoplastic radiation therapy: Secondary | ICD-10-CM | POA: Insufficient documentation

## 2011-01-08 DIAGNOSIS — C3410 Malignant neoplasm of upper lobe, unspecified bronchus or lung: Secondary | ICD-10-CM | POA: Insufficient documentation

## 2011-01-08 DIAGNOSIS — R21 Rash and other nonspecific skin eruption: Secondary | ICD-10-CM | POA: Insufficient documentation

## 2011-01-12 ENCOUNTER — Ambulatory Visit (HOSPITAL_COMMUNITY): Payer: Medicare Other

## 2011-01-12 ENCOUNTER — Ambulatory Visit (HOSPITAL_COMMUNITY)
Admission: RE | Admit: 2011-01-12 | Discharge: 2011-01-12 | Disposition: A | Discharge status: Home / Self Care | Payer: Medicare Other | Source: Ambulatory Visit | Attending: Thoracic Surgery | Admitting: Thoracic Surgery

## 2011-01-12 DIAGNOSIS — Z01818 Encounter for other preprocedural examination: Secondary | ICD-10-CM | POA: Insufficient documentation

## 2011-01-12 DIAGNOSIS — K219 Gastro-esophageal reflux disease without esophagitis: Secondary | ICD-10-CM | POA: Insufficient documentation

## 2011-01-12 DIAGNOSIS — Z01812 Encounter for preprocedural laboratory examination: Secondary | ICD-10-CM | POA: Insufficient documentation

## 2011-01-12 DIAGNOSIS — I10 Essential (primary) hypertension: Secondary | ICD-10-CM | POA: Insufficient documentation

## 2011-01-12 DIAGNOSIS — Z79899 Other long term (current) drug therapy: Secondary | ICD-10-CM | POA: Insufficient documentation

## 2011-01-12 DIAGNOSIS — C3490 Malignant neoplasm of unspecified part of unspecified bronchus or lung: Secondary | ICD-10-CM | POA: Insufficient documentation

## 2011-01-12 LAB — CBC
MCH: 29.8 pg (ref 26.0–34.0)
Platelets: 246 10*3/uL (ref 150–400)
RBC: 4.77 MIL/uL (ref 3.87–5.11)
WBC: 7.1 10*3/uL (ref 4.0–10.5)

## 2011-01-12 LAB — COMPREHENSIVE METABOLIC PANEL
ALT: 18 U/L (ref 0–35)
Calcium: 9.6 mg/dL (ref 8.4–10.5)
Creatinine, Ser: 0.86 mg/dL (ref 0.50–1.10)
GFR calc non Af Amer: >60 mL/min (ref >60)
Glucose, Bld: 104 mg/dL — ABNORMAL HIGH (ref 70–99)
Sodium: 142 mEq/L (ref 135–145)
Total Protein: 6.5 g/dL (ref 6.0–8.3)

## 2011-01-12 LAB — APTT: aPTT: 28 seconds (ref 24–37)

## 2011-01-12 LAB — PROTIME-INR: Prothrombin Time: 13.3 seconds (ref 11.6–15.2)

## 2011-01-13 ENCOUNTER — Emergency Department (HOSPITAL_COMMUNITY): Payer: Medicare Other

## 2011-01-13 ENCOUNTER — Emergency Department (HOSPITAL_COMMUNITY)
Admission: EM | Admit: 2011-01-13 | Discharge: 2011-01-13 | Disposition: A | Discharge status: Other Acute Inpatient Hospital | Payer: Medicare Other | Source: Home / Self Care | Attending: Emergency Medicine | Admitting: Emergency Medicine

## 2011-01-13 ENCOUNTER — Ambulatory Visit: Payer: Medicare Other | Admitting: Thoracic Surgery

## 2011-01-13 ENCOUNTER — Inpatient Hospital Stay (HOSPITAL_COMMUNITY)
Admission: AD | Admit: 2011-01-13 | Discharge: 2011-01-15 | DRG: 200 | Disposition: A | Discharge status: Home / Self Care | Payer: Medicare Other | Source: Ambulatory Visit | Attending: Thoracic Surgery | Admitting: Thoracic Surgery

## 2011-01-13 DIAGNOSIS — R0602 Shortness of breath: Secondary | ICD-10-CM | POA: Insufficient documentation

## 2011-01-13 DIAGNOSIS — Z87891 Personal history of nicotine dependence: Secondary | ICD-10-CM

## 2011-01-13 DIAGNOSIS — E7800 Pure hypercholesterolemia, unspecified: Secondary | ICD-10-CM | POA: Diagnosis present

## 2011-01-13 DIAGNOSIS — Y92009 Unspecified place in unspecified non-institutional (private) residence as the place of occurrence of the external cause: Secondary | ICD-10-CM

## 2011-01-13 DIAGNOSIS — R599 Enlarged lymph nodes, unspecified: Secondary | ICD-10-CM | POA: Diagnosis present

## 2011-01-13 DIAGNOSIS — J9383 Other pneumothorax: Secondary | ICD-10-CM | POA: Insufficient documentation

## 2011-01-13 DIAGNOSIS — I10 Essential (primary) hypertension: Secondary | ICD-10-CM | POA: Diagnosis present

## 2011-01-13 DIAGNOSIS — J95811 Postprocedural pneumothorax: Secondary | ICD-10-CM

## 2011-01-13 DIAGNOSIS — C3490 Malignant neoplasm of unspecified part of unspecified bronchus or lung: Secondary | ICD-10-CM | POA: Insufficient documentation

## 2011-01-13 DIAGNOSIS — K219 Gastro-esophageal reflux disease without esophagitis: Secondary | ICD-10-CM | POA: Diagnosis present

## 2011-01-13 DIAGNOSIS — J4489 Other specified chronic obstructive pulmonary disease: Secondary | ICD-10-CM | POA: Diagnosis present

## 2011-01-13 DIAGNOSIS — C3410 Malignant neoplasm of upper lobe, unspecified bronchus or lung: Secondary | ICD-10-CM | POA: Diagnosis present

## 2011-01-13 DIAGNOSIS — J449 Chronic obstructive pulmonary disease, unspecified: Secondary | ICD-10-CM | POA: Diagnosis present

## 2011-01-13 DIAGNOSIS — Y849 Medical procedure, unspecified as the cause of abnormal reaction of the patient, or of later complication, without mention of misadventure at the time of the procedure: Secondary | ICD-10-CM | POA: Diagnosis present

## 2011-01-13 DIAGNOSIS — Z86718 Personal history of other venous thrombosis and embolism: Secondary | ICD-10-CM

## 2011-01-13 DIAGNOSIS — I739 Peripheral vascular disease, unspecified: Secondary | ICD-10-CM | POA: Diagnosis present

## 2011-01-13 DIAGNOSIS — M810 Age-related osteoporosis without current pathological fracture: Secondary | ICD-10-CM | POA: Diagnosis present

## 2011-01-13 DIAGNOSIS — Z79899 Other long term (current) drug therapy: Secondary | ICD-10-CM

## 2011-01-13 LAB — DIFFERENTIAL
Basophils Relative: 0 % (ref 0–1)
Eosinophils Absolute: 0.3 10*3/uL (ref 0.0–0.7)
Eosinophils Relative: 2 % (ref 0–5)
Monocytes Absolute: 1.2 10*3/uL — ABNORMAL HIGH (ref 0.1–1.0)
Monocytes Relative: 9 % (ref 3–12)
Neutro Abs: 11.1 10*3/uL — ABNORMAL HIGH (ref 1.7–7.7)

## 2011-01-13 LAB — BASIC METABOLIC PANEL
CO2: 26 mEq/L (ref 19–32)
Calcium: 9.1 mg/dL (ref 8.4–10.5)
Creatinine, Ser: 0.73 mg/dL (ref 0.50–1.10)

## 2011-01-13 LAB — CBC
Hemoglobin: 13.5 g/dL (ref 12.0–15.0)
MCH: 28.5 pg (ref 26.0–34.0)
MCHC: 31.8 g/dL (ref 30.0–36.0)
RDW: 13.4 % (ref 11.5–15.5)

## 2011-01-13 LAB — POCT I-STAT, CHEM 8
BUN: 13 mg/dL (ref 6–23)
Calcium, Ion: 1.1 mmol/L — ABNORMAL LOW (ref 1.12–1.32)
Creatinine, Ser: 0.9 mg/dL (ref 0.50–1.10)
Glucose, Bld: 125 mg/dL — ABNORMAL HIGH (ref 70–99)
TCO2: 25 mmol/L (ref 0–100)

## 2011-01-13 LAB — TROPONIN I: Troponin I: <0.3 ng/mL (ref <0.30)

## 2011-01-13 MED ORDER — IOHEXOL 300 MG/ML  SOLN
80.0000 mL | Freq: Once | INTRAMUSCULAR | Status: AC | PRN
  Administered 2011-01-13: 80 mL via INTRAVENOUS

## 2011-01-14 ENCOUNTER — Inpatient Hospital Stay (HOSPITAL_COMMUNITY): Payer: Medicare Other

## 2011-01-14 LAB — CBC
MCH: 29.5 pg (ref 26.0–34.0)
MCV: 90.5 fL (ref 78.0–100.0)
Platelets: 166 10*3/uL (ref 150–400)
RDW: 13.8 % (ref 11.5–15.5)
WBC: 10.4 10*3/uL (ref 4.0–10.5)

## 2011-01-15 ENCOUNTER — Inpatient Hospital Stay (HOSPITAL_COMMUNITY): Payer: Medicare Other

## 2011-01-18 ENCOUNTER — Other Ambulatory Visit: Payer: Self-pay | Admitting: Internal Medicine

## 2011-01-18 ENCOUNTER — Encounter (HOSPITAL_BASED_OUTPATIENT_CLINIC_OR_DEPARTMENT_OTHER): Payer: Medicare Other | Admitting: Internal Medicine

## 2011-01-18 DIAGNOSIS — Z5111 Encounter for antineoplastic chemotherapy: Secondary | ICD-10-CM

## 2011-01-18 DIAGNOSIS — C3410 Malignant neoplasm of upper lobe, unspecified bronchus or lung: Secondary | ICD-10-CM

## 2011-01-18 LAB — CBC WITH DIFFERENTIAL/PLATELET
BASO%: 0.4 % (ref 0.0–2.0)
EOS%: 6.3 % (ref 0.0–7.0)
HGB: 12.7 g/dL (ref 11.6–15.9)
MCH: 29.8 pg (ref 25.1–34.0)
MCHC: 33.7 g/dL (ref 31.5–36.0)
RDW: 13.5 % (ref 11.2–14.5)
lymph#: 0.5 10*3/uL — ABNORMAL LOW (ref 0.9–3.3)

## 2011-01-18 LAB — COMPREHENSIVE METABOLIC PANEL
ALT: 18 U/L (ref 0–35)
AST: 18 U/L (ref 0–37)
Albumin: 3.8 g/dL (ref 3.5–5.2)
Calcium: 9.3 mg/dL (ref 8.4–10.5)
Chloride: 101 mEq/L (ref 96–112)
Potassium: 3.8 mEq/L (ref 3.5–5.3)
Sodium: 137 mEq/L (ref 135–145)

## 2011-01-19 NOTE — H&P (Signed)
[name], CRISS NO.:  [id number]  MEDICAL RECORD NO.:  [id number]  LOCATION:  2027                         FACILITY:  [facility]  PHYSICIAN:  Ines Bloomer, M.D. DATE OF BIRTH:  1931/09/24  DATE OF ADMISSION:  01/13/2011 DATE OF DISCHARGE:                             HISTORY & PHYSICAL   CHIEF COMPLAINT:  Shortness of breath.  HISTORY:  The patient is a 77 year old female recently diagnosed with stage IIIA non-small cell adenocarcinoma of the right lung.  This was confirmed by ENB.  She underwent placement of a Port-A-Cath yesterday for planned chemotherapy and radiation treatments to start next week. Initial attempt was made on the left side for placement of the Port-A- Cath but this was unsuccessful.  It was placed through the right internal jugular vein.  Following the procedure, she had no pneumothorax on chest x-ray.  She went home and within a couple of hours she started developing some symptoms of shortness of breath, as well as left-sided chest pain.  She presented on today's date to the [facility] emergency department where she was found on chest x-ray to have approximately 10- 15% pneumothorax confirmed on CT scan.  She was felt to require transfer to [facility] for further conservative management.  It is not felt at this time that she will require placement of a chest tube but this is definitely a consideration based on her clinical course.  She denies cough or sputum production.  She denies hemoptysis.  She does have shortness of breath but this has improved with supplemental oxygen.  Her pain at times has been as much as 10/10, however, currently it is somewhat better.  She denies fever or chills but does have some intermittent diaphoresis.  She has some reflux symptoms.  She has no frank chest pain.  PAST MEDICAL HISTORY: 1. Stage IIIA adenocarcinoma of the right upper lobe with mediastinal     lymphadenopathy. 2. Hypertension. 3.  Osteoporosis. 4. COPD with history of tobacco abuse. 5. History of hypercholesterolemia. 6. History of peripheral vascular occlusive disease. 7. History of left iliac occlusive disease. 8. History of gastroesophageal reflux disease. 9. History of right DVT in her right leg when in her 30s, although she     is uncertain of full details of this and apparently was not placed     on any anticoagulation at that time.  PAST SURGICAL HISTORY:  She has been told she needs cataract surgery but this has not been done.  She had a previous left-to-right femoral- femoral bypass.  She had L3-4 laminectomy and microdiskectomy by Dr. Cheree Ditto, also hysterectomy approximately 40 years ago.  ALLERGIES:  ALEVE which causes a rash and ZYBAN which causes a rash.  CURRENT MEDICATIONS:  Losartan/hydrochlorothiazide, dose is not certain, tramadol 50 mg q. 6 p.r.n., vitamin D 50,000 units weekly, WelChol dosage not known, Align pro antibiotic is the last medication.  REVIEW OF SYMPTOMS:  See the history of present illness for pertinent positives and negatives, otherwise, unremarkable.  SOCIAL HISTORY:  She is a former smoker, quit a couple of years ago with approximately 50 pack year history, no regular alcohol use.  FAMILY HISTORY:  Noncontributory.  PHYSICAL EXAMINATION:  VITAL SIGNS:  Blood pressure 140/67, heart rate 96, respirations 29, temperature 98.0, and oxygen saturation 99% on 2 liters. GENERAL APPEARANCE:  A 77 year old white female in no acute distress currently on supplemental oxygen. HEENT:  Normocephalic, atraumatic.  Sclerae nonicteric.  Pharynx is clear of exudates or erythema. NECK:  Supple.  No jugular venous distention.  No palpable subcutaneous air.  Carotids are palpable.  No bruits.  No lymphadenopathy. PULMONARY:  Symmetrical on inspiration, unlabored, diminished throughout with expiratory wheeze which is scattered. CARDIAC:  Regular rate and rhythm.  Normal S1-S2 without  murmurs, gallops or rubs. ABDOMEN:  Soft.  She has some mild diffuse tenderness to palpation.  No guarding or rebound.  No masses, bruits or hepatosplenomegaly. GENITOURINARY/RECTAL:  Deferred. EXTREMITIES:  No clubbing, cyanosis or edema.  Peripheral pulses are equal and intact bilateral dorsalis pedis and posterior tibial. NEUROLOGICAL:  Nonfocal grossly and symmetrical with muscle strength grossly 5+ and equal bilaterally.  ASSESSMENT:  She has a small left pneumothorax iatrogenic following attempted placement of Port-A-Cath.  PLAN:  At this point is pain medication, bronchodilators p.r.n., and close observation with repeat chest x-ray in the a.m.     Rowe Clack, P.A.-C.   ______________________________ Ines Bloomer, M.D.    Sherryll Burger  D:  01/13/2011  T:  01/14/2011  Job:  [id number]  Electronically Signed by Deniece Portela GOLD P.A.-C. on 01/18/2011 02:50:37 PM Electronically Signed by Jovita Gamma M.D. on 01/19/2011 04:35:08 PM

## 2011-01-19 NOTE — Op Note (Signed)
  [name], [name]                ACCOUNT NO.:  [id number]  MEDICAL RECORD NO.:  [id number]  LOCATION:                                 FACILITY:  PHYSICIAN:  Ines Bloomer, M.D. DATE OF BIRTH:  02/05/1932  DATE OF PROCEDURE: DATE OF DISCHARGE:                              OPERATIVE REPORT   PREOPERATIVE DIAGNOSIS:  Stage IIIA non-small-cell lung cancer.  POSTOPERATIVE DIAGNOSIS:  Stage IIIA non-small-cell lung cancer.  OPERATION PERFORMED:  Right internal jugular Port-A-Cath.  SURGEON:  Ines Bloomer, MD  ANESTHESIA:  Xylocaine 1% and IV sedation.  After adequate IV sedation, the right and left neck and chest were prepped and draped in usual sterile manner.  The area was infiltrated with 1% Xylocaine at the left infraclavicular area and a left subclavian puncture was done.  A guidewire was attempted to thread to the right atrium from the left side, but it kept going, it would not go down the superior vena cava and would go and would just curl and come back into the innominate vein.  We tried this several times and could not get a wire to go, so we abandoned the left subclavian and infiltrated an area at the right sternocleidomastoid and did a right internal jugular puncture and a guidewire threaded easily to the right atrium.  Stab wound was made around the guidewire.  Another area was infiltrated with 1% Xylocaine.  Inferior to this, transverse incision was made and dissection was carried down through the subcutaneous tissue, to the pectoral fascia.  The 9.6 Bard Port-A-Cath was inserted and then sutured in place with 2-0 silk.  The tubing was tunneled from the pocket to the stab wound around the guidewire and then measured with fluoro to go to the right atrial SVC junction.  It was cut appropriately over the guidewire and passed the dilator with peel-away sheath.  The dilator and the guidewire were removed and through the peel-away sheath, we passed the tubing.   The peel-away sheath was removed.  It was confirmed to be in the distal SVC with fluoro and flushed easily and withdrew easily. The wounds were closed with 3-0 Vicryl in the subcutaneous stitch and subcuticular stitch.  Dermabond for the skin.  Dry and sterile dressing was applied.  The patient returned to recovery room in stable condition.     Ines Bloomer, M.D.     DPB/MEDQ  D:  01/12/2011  T:  01/12/2011  Job:  [id number]  Electronically Signed by Jovita Gamma M.D. on 01/19/2011 04:35:03 PM

## 2011-01-20 ENCOUNTER — Ambulatory Visit
Admission: RE | Admit: 2011-01-20 | Discharge: 2011-01-20 | Disposition: A | Discharge status: Home / Self Care | Payer: Medicare Other | Source: Ambulatory Visit | Attending: Thoracic Surgery | Admitting: Thoracic Surgery

## 2011-01-20 ENCOUNTER — Other Ambulatory Visit: Payer: Self-pay | Admitting: Thoracic Surgery

## 2011-01-20 ENCOUNTER — Ambulatory Visit (INDEPENDENT_AMBULATORY_CARE_PROVIDER_SITE_OTHER): Payer: Medicare Other | Admitting: Thoracic Surgery

## 2011-01-20 DIAGNOSIS — C3490 Malignant neoplasm of unspecified part of unspecified bronchus or lung: Secondary | ICD-10-CM

## 2011-01-20 NOTE — Letter (Signed)
January 20, 2011  Lajuana Matte, MD [phone] [place], [place] [address]  Re:  [name], [name]                DOB:  08-Nov-1931  Dear Arbutus Ped,  I saw the patient back in the office today and her Port-A-Cath sites were healing well on the left side which she developed a pneumothorax. A chest x-ray showed complete resolution.  She is having no more pain. Her blood pressure is 113/60, pulse 90, respirations 16 and sats were 94%.  She has received her first dose of chemo and started on radiation. I will see her back again in the future after she completed her treatments.  Ines Bloomer, M.D. Electronically Signed  DPB/MEDQ  D:  01/20/2011  T:  01/20/2011  Job:  [id number]

## 2011-01-25 ENCOUNTER — Encounter (HOSPITAL_BASED_OUTPATIENT_CLINIC_OR_DEPARTMENT_OTHER): Payer: Medicare Other | Admitting: Internal Medicine

## 2011-01-25 ENCOUNTER — Other Ambulatory Visit: Payer: Self-pay | Admitting: Internal Medicine

## 2011-01-25 DIAGNOSIS — Z5111 Encounter for antineoplastic chemotherapy: Secondary | ICD-10-CM

## 2011-01-25 DIAGNOSIS — C3410 Malignant neoplasm of upper lobe, unspecified bronchus or lung: Secondary | ICD-10-CM

## 2011-01-25 LAB — COMPREHENSIVE METABOLIC PANEL
Albumin: 4.1 g/dL (ref 3.5–5.2)
BUN: 14 mg/dL (ref 6–23)
CO2: 24 mEq/L (ref 19–32)
Glucose, Bld: 77 mg/dL (ref 70–99)
Potassium: 4.4 mEq/L (ref 3.5–5.3)
Sodium: 135 mEq/L (ref 135–145)
Total Bilirubin: 0.6 mg/dL (ref 0.3–1.2)
Total Protein: 6.3 g/dL (ref 6.0–8.3)

## 2011-01-25 LAB — CBC WITH DIFFERENTIAL/PLATELET
MCH: 30.2 pg (ref 25.1–34.0)
MCHC: 33.6 g/dL (ref 31.5–36.0)
MONO%: 7.2 % (ref 0.0–14.0)
NEUT%: 76.1 % (ref 38.4–76.8)
RDW: 13.5 % (ref 11.2–14.5)
lymph#: 0.5 10*3/uL — ABNORMAL LOW (ref 0.9–3.3)

## 2011-01-26 NOTE — Discharge Summary (Signed)
NAMECARMELITA, [name]                ACCOUNT NO.:  [id number]  MEDICAL RECORD NO.:  [id number]  LOCATION:  2027                         FACILITY:  [facility]  PHYSICIAN:  Ines Bloomer, M.D. DATE OF BIRTH:  [birth year]  DATE OF ADMISSION:  01/13/2011 DATE OF DISCHARGE:                              DISCHARGE SUMMARY   FINAL DIAGNOSIS:  Left pneumothorax following attempted placement of Port-A-Cath.  SECONDARY DIAGNOSES: 1. Stage III adenocarcinoma of the right upper lobe, mediastinal     lymphadenopathy. 2. Hypertension. 3. Osteoporosis. 4. Chronic obstructive pulmonary disease with history of tobacco     abuse. 5. History of hypercholesterolemia. 6. History of peripheral vascular occlusive disease. 7. History of left iliac occlusive disease. 8. History of gastroesophageal reflux disease. 9. History of right deep vein thrombosis in her 30s. 10.Status post left-to-right femoral-femoral bypass surgery. 11.Status post L3-L4 laminectomy and microdiskectomy by Dr. Cheree Ditto. 12.Status post hysterectomy. 13.Status post placement of right internal jugular Port-A-Cath by Dr.     Edwyna Shell on January 12, 2011.  HISTORY AND PHYSICAL AND HOSPITAL COURSE:  The patient is 78-year female recently diagnosed with stage III non-small cell adenocarcinoma of the right lung.  This was confirmed by ENB.  The patient underwent placement of Port-A-Cath yesterday by Dr. Edwyna Shell on January 12, 2011, for a planned chemotherapy and radiation treatment to start next week.  Initial attempt of the Port-A-Cath was made on the left side, but was unsuccessful.  It was placed to the right internal jugular vein.  The patient tolerated well.  Followup chest x-ray showed no pneumothorax. The patient was discharged to home.  She started developing shortness of breath and left-sided chest pain that evening.  On January 13, 2011, the patient presented to [facility] Emergency Room, chest x-ray obtained, found to have 10-15%  pneumothorax which was confirmed by CT scan.  Dr. Edwyna Shell was contacted and made arrangements for patient to transfer over to [facility] for admission.  For further details of the patient's past medical history and physical exam, please see dictated H and P.  [name] was admitted to [facility] on January 13, 2011, with left pneumothorax following attempted placement of Port-A-Cath.  The patient's pneumothorax on chest x-ray showed 5-10% and then 5-15% confirmed on CT scan.  It was felt that this could be observed. Followup chest x-ray on January 14, 2011, showed less than 5% left apical pneumothorax.  We will plan to check a repeat PA and lateral chest x-ray in the a.m. for further followup.  During patient's hospitalization, her vital signs have been followed.  She has remained afebrile in normal sinus rhythm.  Blood pressure is stable.  O2 sat is greater than 90% on 2 liters.  She has been started on Symbicort inhaler as well as albuterol nebulizer p.r.n.  The patient is up ambulating without difficulty.  Her Port-A-Cath site noted to be clean, dry, and intact and healing well.  She is tolerating diet well.  The patient will be tentatively ready for discharge to home in the a.m. pending followup chest x-ray, remained stable and improved.  FOLLOWUP APPOINTMENTS:  A followup appointment has been arranged with Dr. Edwyna Shell for  January 20, 2011, at 1 o'clock p.m.  The patient will need to obtain PA and lateral chest x-ray 45 minutes prior to this appointment.  ACTIVITY:  Patient instructed no driving until released to do so, no lifting over 10 pounds.  She is told to ambulate 3-4 times per day. Progress as tolerated and continue her breathing exercises.  INCISIONAL CARE:  The patient is told to shower, washing her incisions using soap and water.  She is to contact the office if she develops any drainage or opening from any of her incision sites.  DIET:  The patient was educated on  diet to be low-fat, low-salt.  DISCHARGE MEDICATIONS: 1. Tessalon 100 mg t.i.d. 2. Symbicort 80/4.5 mcg 2 puffs b.i.d. 3. Oxycodone 5 mg 1-2 tabs q.4 hours p.r.n. pain. 4. Align 4 mg at night. 5. Losartan/HCTZ 50/12.5 mg daily. 6. Motrin 200 mg 2 tablets daily p.r.n. 7. Multivitamin daily. 8. Tramadol 50 mg q.6 hours p.r.n. pain. 9. Vitamin D weekly. 10.WelChol 625 mg 3 tablets b.i.d.     Sol Blazing, PA   ______________________________ Ines Bloomer, M.D.    KMD/MEDQ  D:  01/14/2011  T:  01/15/2011  Job:  [id number]  Electronically Signed by Cameron Proud PA on 01/20/2011 09:41:43 AM Electronically Signed by Jovita Gamma M.D. on 01/26/2011 02:58:47 PM

## 2011-02-01 ENCOUNTER — Other Ambulatory Visit: Payer: Self-pay | Admitting: Internal Medicine

## 2011-02-01 ENCOUNTER — Encounter (HOSPITAL_BASED_OUTPATIENT_CLINIC_OR_DEPARTMENT_OTHER): Payer: Medicare Other | Admitting: Internal Medicine

## 2011-02-01 DIAGNOSIS — Z5111 Encounter for antineoplastic chemotherapy: Secondary | ICD-10-CM

## 2011-02-01 DIAGNOSIS — C3410 Malignant neoplasm of upper lobe, unspecified bronchus or lung: Secondary | ICD-10-CM

## 2011-02-01 LAB — CBC WITH DIFFERENTIAL/PLATELET
Eosinophils Absolute: 0.1 10*3/uL (ref 0.0–0.5)
HGB: 13.4 g/dL (ref 11.6–15.9)
MONO#: 0.3 10*3/uL (ref 0.1–0.9)
NEUT#: 3.9 10*3/uL (ref 1.5–6.5)
RBC: 4.48 10*6/uL (ref 3.70–5.45)
RDW: 13.9 % (ref 11.2–14.5)
WBC: 4.8 10*3/uL (ref 3.9–10.3)

## 2011-02-01 LAB — COMPREHENSIVE METABOLIC PANEL
Albumin: 3.8 g/dL (ref 3.5–5.2)
CO2: 23 mEq/L (ref 19–32)
Calcium: 8.7 mg/dL (ref 8.4–10.5)
Chloride: 99 mEq/L (ref 96–112)
Glucose, Bld: 145 mg/dL — ABNORMAL HIGH (ref 70–99)
Potassium: 3.7 mEq/L (ref 3.5–5.3)
Sodium: 133 mEq/L — ABNORMAL LOW (ref 135–145)
Total Protein: 6.1 g/dL (ref 6.0–8.3)

## 2011-02-08 ENCOUNTER — Other Ambulatory Visit: Payer: Self-pay | Admitting: Internal Medicine

## 2011-02-08 ENCOUNTER — Encounter (HOSPITAL_BASED_OUTPATIENT_CLINIC_OR_DEPARTMENT_OTHER): Payer: Medicare Other | Admitting: Internal Medicine

## 2011-02-08 DIAGNOSIS — Z5111 Encounter for antineoplastic chemotherapy: Secondary | ICD-10-CM

## 2011-02-08 DIAGNOSIS — C3410 Malignant neoplasm of upper lobe, unspecified bronchus or lung: Secondary | ICD-10-CM

## 2011-02-08 LAB — CBC WITH DIFFERENTIAL/PLATELET
Basophils Absolute: 0.1 10*3/uL (ref 0.0–0.1)
EOS%: 2 % (ref 0.0–7.0)
Eosinophils Absolute: 0.1 10*3/uL (ref 0.0–0.5)
HGB: 13.5 g/dL (ref 11.6–15.9)
MONO#: 0.3 10*3/uL (ref 0.1–0.9)
NEUT#: 3.2 10*3/uL (ref 1.5–6.5)
RDW: 14 % (ref 11.2–14.5)
lymph#: 0.5 10*3/uL — ABNORMAL LOW (ref 0.9–3.3)

## 2011-02-08 LAB — COMPREHENSIVE METABOLIC PANEL
Albumin: 4.1 g/dL (ref 3.5–5.2)
Alkaline Phosphatase: 54 U/L (ref 39–117)
CO2: 27 mEq/L (ref 19–32)
Glucose, Bld: 81 mg/dL (ref 70–99)
Potassium: 3.4 mEq/L — ABNORMAL LOW (ref 3.5–5.3)
Sodium: 137 mEq/L (ref 135–145)
Total Protein: 6 g/dL (ref 6.0–8.3)

## 2011-02-15 ENCOUNTER — Encounter (HOSPITAL_BASED_OUTPATIENT_CLINIC_OR_DEPARTMENT_OTHER): Payer: Medicare Other | Admitting: Internal Medicine

## 2011-02-15 ENCOUNTER — Other Ambulatory Visit: Payer: Self-pay | Admitting: Internal Medicine

## 2011-02-15 DIAGNOSIS — Z5111 Encounter for antineoplastic chemotherapy: Secondary | ICD-10-CM

## 2011-02-15 DIAGNOSIS — C3410 Malignant neoplasm of upper lobe, unspecified bronchus or lung: Secondary | ICD-10-CM

## 2011-02-15 LAB — CBC WITH DIFFERENTIAL/PLATELET
EOS%: 3.4 % (ref 0.0–7.0)
Eosinophils Absolute: 0.2 10*3/uL (ref 0.0–0.5)
MCV: 89.5 fL (ref 79.5–101.0)
MONO%: 7.5 % (ref 0.0–14.0)
NEUT#: 3.4 10*3/uL (ref 1.5–6.5)
RBC: 4.21 10*6/uL (ref 3.70–5.45)
RDW: 15.1 % — ABNORMAL HIGH (ref 11.2–14.5)
nRBC: 0 % (ref 0–0)

## 2011-02-15 LAB — COMPREHENSIVE METABOLIC PANEL
ALT: 22 U/L (ref 0–35)
AST: 19 U/L (ref 0–37)
Alkaline Phosphatase: 59 U/L (ref 39–117)
CO2: 26 mEq/L (ref 19–32)
Sodium: 135 mEq/L (ref 135–145)
Total Bilirubin: 0.4 mg/dL (ref 0.3–1.2)
Total Protein: 6.1 g/dL (ref 6.0–8.3)

## 2011-02-23 ENCOUNTER — Other Ambulatory Visit: Payer: Self-pay | Admitting: Internal Medicine

## 2011-02-23 ENCOUNTER — Encounter (HOSPITAL_BASED_OUTPATIENT_CLINIC_OR_DEPARTMENT_OTHER): Payer: Medicare Other | Admitting: Internal Medicine

## 2011-02-23 ENCOUNTER — Ambulatory Visit (HOSPITAL_COMMUNITY)
Admission: RE | Admit: 2011-02-23 | Discharge: 2011-02-23 | Disposition: A | Discharge status: Home / Self Care | Payer: Medicare Other | Source: Ambulatory Visit | Attending: Internal Medicine | Admitting: Internal Medicine

## 2011-02-23 DIAGNOSIS — C3410 Malignant neoplasm of upper lobe, unspecified bronchus or lung: Secondary | ICD-10-CM

## 2011-02-23 DIAGNOSIS — Z5111 Encounter for antineoplastic chemotherapy: Secondary | ICD-10-CM

## 2011-02-23 DIAGNOSIS — M79609 Pain in unspecified limb: Secondary | ICD-10-CM | POA: Insufficient documentation

## 2011-02-23 LAB — COMPREHENSIVE METABOLIC PANEL
Albumin: 3.8 g/dL (ref 3.5–5.2)
CO2: 25 mEq/L (ref 19–32)
Glucose, Bld: 134 mg/dL — ABNORMAL HIGH (ref 70–99)
Sodium: 137 mEq/L (ref 135–145)
Total Bilirubin: 0.6 mg/dL (ref 0.3–1.2)
Total Protein: 5.7 g/dL — ABNORMAL LOW (ref 6.0–8.3)

## 2011-02-23 LAB — CBC WITH DIFFERENTIAL/PLATELET
BASO%: 0.8 % (ref 0.0–2.0)
Basophils Absolute: 0 10*3/uL (ref 0.0–0.1)
EOS%: 1.1 % (ref 0.0–7.0)
Eosinophils Absolute: 0 10*3/uL (ref 0.0–0.5)
HCT: 37.1 % (ref 34.8–46.6)
HGB: 12.9 g/dL (ref 11.6–15.9)
LYMPH%: 13.2 % — ABNORMAL LOW (ref 14.0–49.7)
MCH: 30.9 pg (ref 25.1–34.0)
MCHC: 34.8 g/dL (ref 31.5–36.0)
MCV: 88.8 fL (ref 79.5–101.0)
MONO#: 0.2 10*3/uL (ref 0.1–0.9)
MONO%: 6.5 % (ref 0.0–14.0)
NEUT#: 2.9 10*3/uL (ref 1.5–6.5)
NEUT%: 78.4 % — ABNORMAL HIGH (ref 38.4–76.8)
Platelets: 137 10*3/uL — ABNORMAL LOW (ref 145–400)
RBC: 4.18 10*6/uL (ref 3.70–5.45)
RDW: 16 % — ABNORMAL HIGH (ref 11.2–14.5)
WBC: 3.7 10*3/uL — ABNORMAL LOW (ref 3.9–10.3)
lymph#: 0.5 10*3/uL — ABNORMAL LOW (ref 0.9–3.3)

## 2011-03-01 ENCOUNTER — Other Ambulatory Visit: Payer: Self-pay | Admitting: Internal Medicine

## 2011-03-01 ENCOUNTER — Encounter (HOSPITAL_BASED_OUTPATIENT_CLINIC_OR_DEPARTMENT_OTHER): Payer: Medicare Other | Admitting: Internal Medicine

## 2011-03-01 DIAGNOSIS — C3490 Malignant neoplasm of unspecified part of unspecified bronchus or lung: Secondary | ICD-10-CM

## 2011-03-01 DIAGNOSIS — C3410 Malignant neoplasm of upper lobe, unspecified bronchus or lung: Secondary | ICD-10-CM

## 2011-03-01 DIAGNOSIS — Z5111 Encounter for antineoplastic chemotherapy: Secondary | ICD-10-CM

## 2011-03-01 LAB — CBC WITH DIFFERENTIAL/PLATELET
Eosinophils Absolute: 0 10*3/uL (ref 0.0–0.5)
MONO#: 0.2 10*3/uL (ref 0.1–0.9)
NEUT#: 2.1 10*3/uL (ref 1.5–6.5)
Platelets: 144 10*3/uL — ABNORMAL LOW (ref 145–400)
RBC: 4.05 10*6/uL (ref 3.70–5.45)
RDW: 16.1 % — ABNORMAL HIGH (ref 11.2–14.5)
WBC: 2.7 10*3/uL — ABNORMAL LOW (ref 3.9–10.3)
lymph#: 0.4 10*3/uL — ABNORMAL LOW (ref 0.9–3.3)

## 2011-03-01 LAB — COMPREHENSIVE METABOLIC PANEL
Albumin: 3.3 g/dL — ABNORMAL LOW (ref 3.5–5.2)
CO2: 30 mEq/L (ref 19–32)
Glucose, Bld: 96 mg/dL (ref 70–99)
Potassium: 3 mEq/L — ABNORMAL LOW (ref 3.5–5.3)
Sodium: 136 mEq/L (ref 135–145)
Total Protein: 5.9 g/dL — ABNORMAL LOW (ref 6.0–8.3)

## 2011-03-31 ENCOUNTER — Other Ambulatory Visit: Payer: Self-pay | Admitting: Internal Medicine

## 2011-03-31 ENCOUNTER — Encounter (HOSPITAL_BASED_OUTPATIENT_CLINIC_OR_DEPARTMENT_OTHER): Payer: Medicare Other | Admitting: Internal Medicine

## 2011-03-31 DIAGNOSIS — C3410 Malignant neoplasm of upper lobe, unspecified bronchus or lung: Secondary | ICD-10-CM

## 2011-03-31 DIAGNOSIS — Z5111 Encounter for antineoplastic chemotherapy: Secondary | ICD-10-CM

## 2011-03-31 LAB — CBC WITH DIFFERENTIAL/PLATELET
BASO%: 0.3 % (ref 0.0–2.0)
Eosinophils Absolute: 0.1 10*3/uL (ref 0.0–0.5)
MCHC: 34 g/dL (ref 31.5–36.0)
MCV: 95.7 fL (ref 79.5–101.0)
MONO#: 0.5 10*3/uL (ref 0.1–0.9)
MONO%: 8.4 % (ref 0.0–14.0)
NEUT#: 4.4 10*3/uL (ref 1.5–6.5)
RBC: 3.72 10*6/uL (ref 3.70–5.45)
RDW: 19.3 % — ABNORMAL HIGH (ref 11.2–14.5)
WBC: 5.4 10*3/uL (ref 3.9–10.3)

## 2011-03-31 LAB — HOLD TUBE, BLOOD BANK

## 2011-04-08 ENCOUNTER — Encounter (HOSPITAL_COMMUNITY): Payer: Self-pay

## 2011-04-08 ENCOUNTER — Ambulatory Visit (HOSPITAL_COMMUNITY)
Admission: RE | Admit: 2011-04-08 | Discharge: 2011-04-08 | Disposition: A | Discharge status: Home / Self Care | Payer: Medicare Other | Source: Ambulatory Visit | Attending: Internal Medicine | Admitting: Internal Medicine

## 2011-04-08 DIAGNOSIS — J438 Other emphysema: Secondary | ICD-10-CM | POA: Insufficient documentation

## 2011-04-08 DIAGNOSIS — C3490 Malignant neoplasm of unspecified part of unspecified bronchus or lung: Secondary | ICD-10-CM | POA: Insufficient documentation

## 2011-04-08 HISTORY — DX: Malignant (primary) neoplasm, unspecified: C80.1

## 2011-04-08 MED ORDER — IOHEXOL 300 MG/ML  SOLN
80.0000 mL | Freq: Once | INTRAMUSCULAR | Status: AC | PRN
  Administered 2011-04-08: 80 mL via INTRAVENOUS

## 2011-04-12 ENCOUNTER — Other Ambulatory Visit: Payer: Self-pay | Admitting: Internal Medicine

## 2011-04-12 ENCOUNTER — Encounter (HOSPITAL_BASED_OUTPATIENT_CLINIC_OR_DEPARTMENT_OTHER): Payer: Medicare Other | Admitting: Internal Medicine

## 2011-04-12 DIAGNOSIS — Z5111 Encounter for antineoplastic chemotherapy: Secondary | ICD-10-CM

## 2011-04-12 DIAGNOSIS — C3410 Malignant neoplasm of upper lobe, unspecified bronchus or lung: Secondary | ICD-10-CM

## 2011-04-12 LAB — CBC WITH DIFFERENTIAL/PLATELET
Basophils Absolute: 0.1 10*3/uL (ref 0.0–0.1)
EOS%: 7 % (ref 0.0–7.0)
Eosinophils Absolute: 0.5 10*3/uL (ref 0.0–0.5)
HCT: 38.3 % (ref 34.8–46.6)
HGB: 13 g/dL (ref 11.6–15.9)
LYMPH%: 10.9 % — ABNORMAL LOW (ref 14.0–49.7)
MCH: 31.9 pg (ref 25.1–34.0)
MCV: 94.1 fL (ref 79.5–101.0)
MONO%: 7.1 % (ref 0.0–14.0)
NEUT#: 5 10*3/uL (ref 1.5–6.5)
NEUT%: 74.3 % (ref 38.4–76.8)
Platelets: 235 10*3/uL (ref 145–400)
RDW: 15.8 % — ABNORMAL HIGH (ref 11.2–14.5)

## 2011-04-15 ENCOUNTER — Encounter (HOSPITAL_BASED_OUTPATIENT_CLINIC_OR_DEPARTMENT_OTHER): Payer: Medicare Other | Admitting: Internal Medicine

## 2011-04-15 ENCOUNTER — Ambulatory Visit
Admission: RE | Admit: 2011-04-15 | Discharge: 2011-04-15 | Disposition: A | Discharge status: Home / Self Care | Payer: Medicare Other | Source: Ambulatory Visit | Attending: Radiation Oncology | Admitting: Radiation Oncology

## 2011-04-15 DIAGNOSIS — C3410 Malignant neoplasm of upper lobe, unspecified bronchus or lung: Secondary | ICD-10-CM

## 2011-04-15 DIAGNOSIS — Z5111 Encounter for antineoplastic chemotherapy: Secondary | ICD-10-CM

## 2011-04-22 ENCOUNTER — Other Ambulatory Visit: Payer: Self-pay | Admitting: Internal Medicine

## 2011-04-22 ENCOUNTER — Encounter (HOSPITAL_BASED_OUTPATIENT_CLINIC_OR_DEPARTMENT_OTHER): Payer: Medicare Other | Admitting: Internal Medicine

## 2011-04-22 DIAGNOSIS — Z5111 Encounter for antineoplastic chemotherapy: Secondary | ICD-10-CM

## 2011-04-22 DIAGNOSIS — C3410 Malignant neoplasm of upper lobe, unspecified bronchus or lung: Secondary | ICD-10-CM

## 2011-04-22 LAB — CBC WITH DIFFERENTIAL/PLATELET
BASO%: 0.6 % (ref 0.0–2.0)
HCT: 37.4 % (ref 34.8–46.6)
LYMPH%: 8 % — ABNORMAL LOW (ref 14.0–49.7)
MCHC: 34.5 g/dL (ref 31.5–36.0)
MCV: 96.1 fL (ref 79.5–101.0)
MONO#: 0.4 10*3/uL (ref 0.1–0.9)
MONO%: 10.2 % (ref 0.0–14.0)
NEUT%: 74.1 % (ref 38.4–76.8)
Platelets: 193 10*3/uL (ref 145–400)
RBC: 3.89 10*6/uL (ref 3.70–5.45)
WBC: 3.5 10*3/uL — ABNORMAL LOW (ref 3.9–10.3)

## 2011-04-22 LAB — COMPREHENSIVE METABOLIC PANEL
ALT: 15 U/L (ref 0–35)
Alkaline Phosphatase: 67 U/L (ref 39–117)
CO2: 24 mEq/L (ref 19–32)
Creatinine, Ser: 0.81 mg/dL (ref 0.50–1.10)
Glucose, Bld: 115 mg/dL — ABNORMAL HIGH (ref 70–99)
Total Bilirubin: 0.9 mg/dL (ref 0.3–1.2)

## 2011-04-26 ENCOUNTER — Other Ambulatory Visit: Payer: Medicare Other | Admitting: Lab

## 2011-04-29 ENCOUNTER — Other Ambulatory Visit (HOSPITAL_BASED_OUTPATIENT_CLINIC_OR_DEPARTMENT_OTHER): Payer: Medicare Other | Admitting: Lab

## 2011-04-29 ENCOUNTER — Other Ambulatory Visit: Payer: Self-pay | Admitting: Internal Medicine

## 2011-04-29 DIAGNOSIS — C3490 Malignant neoplasm of unspecified part of unspecified bronchus or lung: Secondary | ICD-10-CM

## 2011-04-29 LAB — CBC WITH DIFFERENTIAL/PLATELET
Basophils Absolute: 0 10*3/uL (ref 0.0–0.1)
Eosinophils Absolute: 0.1 10*3/uL (ref 0.0–0.5)
HCT: 35.1 % (ref 34.8–46.6)
LYMPH%: 6.7 % — ABNORMAL LOW (ref 14.0–49.7)
MCV: 97.1 fL (ref 79.5–101.0)
MONO#: 0.6 10*3/uL (ref 0.1–0.9)
NEUT#: 3.2 10*3/uL (ref 1.5–6.5)
NEUT%: 76 % (ref 38.4–76.8)
Platelets: 146 10*3/uL (ref 145–400)
WBC: 4.2 10*3/uL (ref 3.9–10.3)

## 2011-04-29 LAB — COMPREHENSIVE METABOLIC PANEL
BUN: 17 mg/dL (ref 6–23)
CO2: 31 mEq/L (ref 19–32)
Creatinine, Ser: 0.73 mg/dL (ref 0.50–1.10)
Glucose, Bld: 79 mg/dL (ref 70–99)
Total Bilirubin: 0.5 mg/dL (ref 0.3–1.2)
Total Protein: 6.5 g/dL (ref 6.0–8.3)

## 2011-05-01 ENCOUNTER — Encounter: Payer: Self-pay | Admitting: *Deleted

## 2011-05-04 ENCOUNTER — Other Ambulatory Visit: Payer: Self-pay | Admitting: Internal Medicine

## 2011-05-04 DIAGNOSIS — C3490 Malignant neoplasm of unspecified part of unspecified bronchus or lung: Secondary | ICD-10-CM | POA: Insufficient documentation

## 2011-05-06 ENCOUNTER — Other Ambulatory Visit: Payer: Self-pay | Admitting: Internal Medicine

## 2011-05-06 ENCOUNTER — Other Ambulatory Visit (HOSPITAL_BASED_OUTPATIENT_CLINIC_OR_DEPARTMENT_OTHER): Payer: Medicare Other | Admitting: Lab

## 2011-05-06 ENCOUNTER — Ambulatory Visit (HOSPITAL_BASED_OUTPATIENT_CLINIC_OR_DEPARTMENT_OTHER): Payer: Medicare Other

## 2011-05-06 VITALS — BP 147/79 | HR 94 | Temp 97.7°F

## 2011-05-06 DIAGNOSIS — C3410 Malignant neoplasm of upper lobe, unspecified bronchus or lung: Secondary | ICD-10-CM

## 2011-05-06 DIAGNOSIS — C3490 Malignant neoplasm of unspecified part of unspecified bronchus or lung: Secondary | ICD-10-CM

## 2011-05-06 DIAGNOSIS — Z5111 Encounter for antineoplastic chemotherapy: Secondary | ICD-10-CM

## 2011-05-06 LAB — CBC WITH DIFFERENTIAL/PLATELET
Eosinophils Absolute: 0 10*3/uL (ref 0.0–0.5)
HCT: 35.7 % (ref 34.8–46.6)
HGB: 12.3 g/dL (ref 11.6–15.9)
LYMPH%: 4.1 % — ABNORMAL LOW (ref 14.0–49.7)
MONO#: 0.5 10*3/uL (ref 0.1–0.9)
NEUT#: 8.8 10*3/uL — ABNORMAL HIGH (ref 1.5–6.5)
Platelets: 262 10*3/uL (ref 145–400)
RBC: 3.78 10*6/uL (ref 3.70–5.45)
WBC: 9.7 10*3/uL (ref 3.9–10.3)
nRBC: 0 % (ref 0–0)

## 2011-05-06 LAB — COMPREHENSIVE METABOLIC PANEL
ALT: 15 U/L (ref 0–35)
CO2: 24 mEq/L (ref 19–32)
Calcium: 9.5 mg/dL (ref 8.4–10.5)
Chloride: 105 mEq/L (ref 96–112)
Creatinine, Ser: 0.7 mg/dL (ref 0.50–1.10)
Total Protein: 6.4 g/dL (ref 6.0–8.3)

## 2011-05-06 MED ORDER — SODIUM CHLORIDE 0.9 % IV SOLN
Freq: Once | INTRAVENOUS | Status: AC
  Administered 2011-05-06: 10:00:00 via INTRAVENOUS

## 2011-05-06 MED ORDER — SODIUM CHLORIDE 0.9 % IV SOLN
500.0000 mg/m2 | Freq: Once | INTRAVENOUS | Status: AC
  Administered 2011-05-06: 875 mg via INTRAVENOUS
  Filled 2011-05-06: qty 35

## 2011-05-06 MED ORDER — DEXAMETHASONE SODIUM PHOSPHATE 4 MG/ML IJ SOLN
20.0000 mg | Freq: Once | INTRAMUSCULAR | Status: AC
  Administered 2011-05-06: 20 mg via INTRAVENOUS

## 2011-05-06 MED ORDER — SODIUM CHLORIDE 0.9 % IJ SOLN
10.0000 mL | INTRAMUSCULAR | Status: DC | PRN
  Administered 2011-05-06: 10 mL
  Filled 2011-05-06: qty 10

## 2011-05-06 MED ORDER — HEPARIN SOD (PORK) LOCK FLUSH 100 UNIT/ML IV SOLN
500.0000 [IU] | Freq: Once | INTRAVENOUS | Status: AC | PRN
  Administered 2011-05-06: 500 [IU]
  Filled 2011-05-06: qty 5

## 2011-05-06 MED ORDER — ONDANSETRON 16 MG/50ML IVPB (CHCC)
16.0000 mg | Freq: Once | INTRAVENOUS | Status: AC
  Administered 2011-05-06: 16 mg via INTRAVENOUS

## 2011-05-06 MED ORDER — SODIUM CHLORIDE 0.9 % IV SOLN
470.5000 mg | Freq: Once | INTRAVENOUS | Status: AC
  Administered 2011-05-06: 470 mg via INTRAVENOUS
  Filled 2011-05-06: qty 47

## 2011-05-06 NOTE — Patient Instructions (Signed)
[facility] Discharge Instructions for Patients Receiving Chemotherapy  Today you received the following chemotherapy agents Carboplatin/Alimta  To help prevent nausea and vomiting after your treatment, we encourage you to take your nausea medication as directed by MD  If you develop nausea and vomiting that is not controlled by your nausea medication, call the clinic. If it is after clinic hours your family physician or the after hours number for the clinic or go to the Emergency Department.   BELOW ARE SYMPTOMS THAT SHOULD BE REPORTED IMMEDIATELY:  *FEVER GREATER THAN 100.5 F  *CHILLS WITH OR WITHOUT FEVER  NAUSEA AND VOMITING THAT IS NOT CONTROLLED WITH YOUR NAUSEA MEDICATION  *UNUSUAL SHORTNESS OF BREATH  *UNUSUAL BRUISING OR BLEEDING  TENDERNESS IN MOUTH AND THROAT WITH OR WITHOUT PRESENCE OF ULCERS  *URINARY PROBLEMS  *BOWEL PROBLEMS  UNUSUAL RASH Items with * indicate a potential emergency and should be followed up as soon as possible.  One of the nurses will contact you 24 hours after your treatment. Please let the nurse know about any problems that you may have experienced. Feel free to call the clinic you have any questions or concerns. The clinic phone number is [phone].   I have been informed and understand all the instructions given to me. I know to contact the clinic, my physician, or go to the Emergency Department if any problems should occur. I do not have any questions at this time, but understand that I may call the clinic during office hours   should I have any questions or need assistance in obtaining follow up care.    __________________________________________  _____________  __________ Signature of Patient or Authorized Representative            Date                   Time    __________________________________________ Nurse's Signature    

## 2011-05-14 ENCOUNTER — Other Ambulatory Visit: Payer: Self-pay | Admitting: *Deleted

## 2011-05-14 ENCOUNTER — Other Ambulatory Visit (HOSPITAL_BASED_OUTPATIENT_CLINIC_OR_DEPARTMENT_OTHER): Payer: Medicare Other

## 2011-05-14 DIAGNOSIS — C3490 Malignant neoplasm of unspecified part of unspecified bronchus or lung: Secondary | ICD-10-CM

## 2011-05-14 DIAGNOSIS — C3410 Malignant neoplasm of upper lobe, unspecified bronchus or lung: Secondary | ICD-10-CM

## 2011-05-14 LAB — COMPREHENSIVE METABOLIC PANEL
ALT: 15 U/L (ref 0–35)
CO2: 25 mEq/L (ref 19–32)
Calcium: 9 mg/dL (ref 8.4–10.5)
Chloride: 104 mEq/L (ref 96–112)
Potassium: 3.7 mEq/L (ref 3.5–5.3)
Sodium: 139 mEq/L (ref 135–145)
Total Protein: 5.9 g/dL — ABNORMAL LOW (ref 6.0–8.3)

## 2011-05-14 LAB — CBC WITH DIFFERENTIAL/PLATELET
BASO%: 0.5 % (ref 0.0–2.0)
HCT: 32.8 % — ABNORMAL LOW (ref 34.8–46.6)
MCHC: 34.9 g/dL (ref 31.5–36.0)
MONO#: 0.2 10*3/uL (ref 0.1–0.9)
RBC: 3.38 10*6/uL — ABNORMAL LOW (ref 3.70–5.45)
WBC: 2.2 10*3/uL — ABNORMAL LOW (ref 3.9–10.3)
lymph#: 0.2 10*3/uL — ABNORMAL LOW (ref 0.9–3.3)

## 2011-05-19 ENCOUNTER — Other Ambulatory Visit: Payer: Self-pay | Admitting: Internal Medicine

## 2011-05-19 DIAGNOSIS — C3490 Malignant neoplasm of unspecified part of unspecified bronchus or lung: Secondary | ICD-10-CM

## 2011-05-20 ENCOUNTER — Other Ambulatory Visit (HOSPITAL_BASED_OUTPATIENT_CLINIC_OR_DEPARTMENT_OTHER): Payer: Medicare Other | Admitting: Lab

## 2011-05-20 DIAGNOSIS — C3490 Malignant neoplasm of unspecified part of unspecified bronchus or lung: Secondary | ICD-10-CM

## 2011-05-20 LAB — CBC WITH DIFFERENTIAL/PLATELET
BASO%: 0 % (ref 0.0–2.0)
EOS%: 1.6 % (ref 0.0–7.0)
LYMPH%: 8.3 % — ABNORMAL LOW (ref 14.0–49.7)
MCHC: 34.6 g/dL (ref 31.5–36.0)
MONO#: 0.4 10*3/uL (ref 0.1–0.9)
Platelets: 121 10*3/uL — ABNORMAL LOW (ref 145–400)
RBC: 3.37 10*6/uL — ABNORMAL LOW (ref 3.70–5.45)
WBC: 3 10*3/uL — ABNORMAL LOW (ref 3.9–10.3)
lymph#: 0.3 10*3/uL — ABNORMAL LOW (ref 0.9–3.3)

## 2011-05-26 ENCOUNTER — Other Ambulatory Visit: Payer: Self-pay | Admitting: *Deleted

## 2011-05-26 DIAGNOSIS — C3490 Malignant neoplasm of unspecified part of unspecified bronchus or lung: Secondary | ICD-10-CM

## 2011-05-27 ENCOUNTER — Other Ambulatory Visit: Payer: Self-pay | Admitting: Internal Medicine

## 2011-05-27 ENCOUNTER — Ambulatory Visit (HOSPITAL_BASED_OUTPATIENT_CLINIC_OR_DEPARTMENT_OTHER): Payer: Medicare Other

## 2011-05-27 ENCOUNTER — Other Ambulatory Visit (HOSPITAL_BASED_OUTPATIENT_CLINIC_OR_DEPARTMENT_OTHER): Payer: Medicare Other | Admitting: Lab

## 2011-05-27 VITALS — BP 144/84 | HR 93 | Temp 97.0°F

## 2011-05-27 DIAGNOSIS — C3410 Malignant neoplasm of upper lobe, unspecified bronchus or lung: Secondary | ICD-10-CM

## 2011-05-27 DIAGNOSIS — C3490 Malignant neoplasm of unspecified part of unspecified bronchus or lung: Secondary | ICD-10-CM

## 2011-05-27 DIAGNOSIS — Z5111 Encounter for antineoplastic chemotherapy: Secondary | ICD-10-CM

## 2011-05-27 LAB — CBC WITH DIFFERENTIAL/PLATELET
BASO%: 0.4 % (ref 0.0–2.0)
HCT: 35.2 % (ref 34.8–46.6)
MCHC: 34.4 g/dL (ref 31.5–36.0)
MONO#: 0.8 10*3/uL (ref 0.1–0.9)
NEUT%: 79.6 % — ABNORMAL HIGH (ref 38.4–76.8)
RDW: 14.8 % — ABNORMAL HIGH (ref 11.2–14.5)
WBC: 7.1 10*3/uL (ref 3.9–10.3)
lymph#: 0.6 10*3/uL — ABNORMAL LOW (ref 0.9–3.3)
nRBC: 0 % (ref 0–0)

## 2011-05-27 MED ORDER — SODIUM CHLORIDE 0.9 % IJ SOLN
10.0000 mL | INTRAMUSCULAR | Status: DC | PRN
  Administered 2011-05-27: 10 mL
  Filled 2011-05-27: qty 10

## 2011-05-27 MED ORDER — HEPARIN SOD (PORK) LOCK FLUSH 100 UNIT/ML IV SOLN
500.0000 [IU] | Freq: Once | INTRAVENOUS | Status: AC | PRN
  Administered 2011-05-27: 500 [IU]
  Filled 2011-05-27: qty 5

## 2011-05-27 MED ORDER — DEXAMETHASONE SODIUM PHOSPHATE 4 MG/ML IJ SOLN
20.0000 mg | Freq: Once | INTRAMUSCULAR | Status: AC
  Administered 2011-05-27: 20 mg via INTRAVENOUS

## 2011-05-27 MED ORDER — SODIUM CHLORIDE 0.9 % IV SOLN
456.0000 mg | Freq: Once | INTRAVENOUS | Status: AC
  Administered 2011-05-27: 460 mg via INTRAVENOUS
  Filled 2011-05-27: qty 46

## 2011-05-27 MED ORDER — ONDANSETRON 16 MG/50ML IVPB (CHCC)
16.0000 mg | Freq: Once | INTRAVENOUS | Status: AC
  Administered 2011-05-27: 16 mg via INTRAVENOUS

## 2011-05-27 MED ORDER — SODIUM CHLORIDE 0.9 % IV SOLN
500.0000 mg/m2 | Freq: Once | INTRAVENOUS | Status: AC
  Administered 2011-05-27: 875 mg via INTRAVENOUS
  Filled 2011-05-27: qty 35

## 2011-05-27 MED ORDER — SODIUM CHLORIDE 0.9 % IV SOLN
Freq: Once | INTRAVENOUS | Status: AC
  Administered 2011-05-27: 12:00:00 via INTRAVENOUS

## 2011-05-27 NOTE — Patient Instructions (Signed)
Patient tolerated tx well. Noted mole under left axilla; advised patient to follow up with PCP for further eval. Instructed patient to notify PCP of chest pain and need for follow up appointment with Dr Arbutus Ped.

## 2011-05-28 ENCOUNTER — Other Ambulatory Visit: Payer: Self-pay | Admitting: Internal Medicine

## 2011-06-01 ENCOUNTER — Telehealth: Payer: Self-pay | Admitting: Internal Medicine

## 2011-06-01 NOTE — Telephone Encounter (Signed)
Talked to pt's husband , gave him all appt date for December

## 2011-06-02 ENCOUNTER — Other Ambulatory Visit: Payer: Self-pay | Admitting: Internal Medicine

## 2011-06-02 DIAGNOSIS — C3490 Malignant neoplasm of unspecified part of unspecified bronchus or lung: Secondary | ICD-10-CM

## 2011-06-03 ENCOUNTER — Other Ambulatory Visit (HOSPITAL_BASED_OUTPATIENT_CLINIC_OR_DEPARTMENT_OTHER): Payer: Medicare Other | Admitting: Lab

## 2011-06-03 ENCOUNTER — Other Ambulatory Visit: Payer: Self-pay | Admitting: Physician Assistant

## 2011-06-03 ENCOUNTER — Telehealth: Payer: Self-pay | Admitting: Internal Medicine

## 2011-06-03 DIAGNOSIS — C3490 Malignant neoplasm of unspecified part of unspecified bronchus or lung: Secondary | ICD-10-CM

## 2011-06-03 LAB — COMPREHENSIVE METABOLIC PANEL
ALT: 18 U/L (ref 0–35)
Alkaline Phosphatase: 61 U/L (ref 39–117)
CO2: 27 mEq/L (ref 19–32)
Creatinine, Ser: 0.81 mg/dL (ref 0.50–1.10)
Sodium: 138 mEq/L (ref 135–145)
Total Bilirubin: 0.7 mg/dL (ref 0.3–1.2)

## 2011-06-03 LAB — CBC WITH DIFFERENTIAL/PLATELET
Basophils Absolute: 0 10*3/uL (ref 0.0–0.1)
Eosinophils Absolute: 0 10*3/uL (ref 0.0–0.5)
HGB: 12.3 g/dL (ref 11.6–15.9)
MCV: 97.4 fL (ref 79.5–101.0)
MONO#: 0.2 10*3/uL (ref 0.1–0.9)
NEUT#: 1.4 10*3/uL — ABNORMAL LOW (ref 1.5–6.5)
RBC: 3.62 10*6/uL — ABNORMAL LOW (ref 3.70–5.45)
RDW: 15.2 % — ABNORMAL HIGH (ref 11.2–14.5)
WBC: 1.9 10*3/uL — ABNORMAL LOW (ref 3.9–10.3)
lymph#: 0.2 10*3/uL — ABNORMAL LOW (ref 0.9–3.3)

## 2011-06-03 NOTE — Telephone Encounter (Signed)
Appt w/ adrena moved to [facility] on 12/27 per adrena req.  Pt has appt for ct on 12/20 and lab moved.aom

## 2011-06-10 ENCOUNTER — Other Ambulatory Visit: Payer: Medicare Other | Admitting: Lab

## 2011-06-10 ENCOUNTER — Other Ambulatory Visit: Payer: Self-pay | Admitting: Diagnostic Radiology

## 2011-06-10 ENCOUNTER — Other Ambulatory Visit (HOSPITAL_BASED_OUTPATIENT_CLINIC_OR_DEPARTMENT_OTHER): Payer: Medicare Other | Admitting: Lab

## 2011-06-10 ENCOUNTER — Ambulatory Visit (HOSPITAL_COMMUNITY)
Admission: RE | Admit: 2011-06-10 | Discharge: 2011-06-10 | Disposition: A | Discharge status: Home / Self Care | Payer: Medicare Other | Source: Ambulatory Visit | Attending: Physician Assistant | Admitting: Physician Assistant

## 2011-06-10 DIAGNOSIS — C3490 Malignant neoplasm of unspecified part of unspecified bronchus or lung: Secondary | ICD-10-CM

## 2011-06-10 DIAGNOSIS — R911 Solitary pulmonary nodule: Secondary | ICD-10-CM | POA: Insufficient documentation

## 2011-06-10 LAB — CBC WITH DIFFERENTIAL/PLATELET
Eosinophils Absolute: 0.1 10*3/uL (ref 0.0–0.5)
HCT: 35.3 % (ref 34.8–46.6)
LYMPH%: 8.6 % — ABNORMAL LOW (ref 14.0–49.7)
MCHC: 34.1 g/dL (ref 31.5–36.0)
MCV: 98.4 fL (ref 79.5–101.0)
MONO#: 0.6 10*3/uL (ref 0.1–0.9)
MONO%: 15.6 % — ABNORMAL HIGH (ref 0.0–14.0)
NEUT#: 2.6 10*3/uL (ref 1.5–6.5)
NEUT%: 74.1 % (ref 38.4–76.8)
Platelets: 125 10*3/uL — ABNORMAL LOW (ref 145–400)
WBC: 3.6 10*3/uL — ABNORMAL LOW (ref 3.9–10.3)

## 2011-06-10 LAB — COMPREHENSIVE METABOLIC PANEL
ALT: 19 U/L (ref 0–35)
AST: 20 U/L (ref 0–37)
Albumin: 4.2 g/dL (ref 3.5–5.2)
Alkaline Phosphatase: 64 U/L (ref 39–117)
BUN: 11 mg/dL (ref 6–23)
CO2: 27 mEq/L (ref 19–32)
Calcium: 9.7 mg/dL (ref 8.4–10.5)
Chloride: 102 mEq/L (ref 96–112)
Creatinine, Ser: 0.85 mg/dL (ref 0.50–1.10)
Glucose, Bld: 81 mg/dL (ref 70–99)
Potassium: 3.8 mEq/L (ref 3.5–5.3)
Sodium: 140 mEq/L (ref 135–145)
Total Bilirubin: 0.4 mg/dL (ref 0.3–1.2)
Total Protein: 6.7 g/dL (ref 6.0–8.3)

## 2011-06-10 MED ORDER — IOHEXOL 300 MG/ML  SOLN
100.0000 mL | Freq: Once | INTRAMUSCULAR | Status: AC | PRN
  Administered 2011-06-10: 100 mL via INTRAVENOUS

## 2011-06-11 ENCOUNTER — Encounter: Payer: Self-pay | Admitting: *Deleted

## 2011-06-11 DIAGNOSIS — M810 Age-related osteoporosis without current pathological fracture: Secondary | ICD-10-CM | POA: Insufficient documentation

## 2011-06-11 DIAGNOSIS — E7800 Pure hypercholesterolemia, unspecified: Secondary | ICD-10-CM | POA: Insufficient documentation

## 2011-06-11 DIAGNOSIS — J449 Chronic obstructive pulmonary disease, unspecified: Secondary | ICD-10-CM | POA: Insufficient documentation

## 2011-06-11 DIAGNOSIS — C801 Malignant (primary) neoplasm, unspecified: Secondary | ICD-10-CM | POA: Insufficient documentation

## 2011-06-11 DIAGNOSIS — I10 Essential (primary) hypertension: Secondary | ICD-10-CM | POA: Insufficient documentation

## 2011-06-17 ENCOUNTER — Ambulatory Visit: Payer: Medicare Other

## 2011-06-17 ENCOUNTER — Other Ambulatory Visit: Payer: Medicare Other | Admitting: Lab

## 2011-06-17 ENCOUNTER — Ambulatory Visit (HOSPITAL_BASED_OUTPATIENT_CLINIC_OR_DEPARTMENT_OTHER): Payer: Medicare Other | Admitting: Internal Medicine

## 2011-06-17 VITALS — BP 126/70 | HR 94 | Temp 97.1°F | Ht 64.0 in | Wt 153.1 lb

## 2011-06-17 DIAGNOSIS — C3490 Malignant neoplasm of unspecified part of unspecified bronchus or lung: Secondary | ICD-10-CM

## 2011-06-17 DIAGNOSIS — Z9221 Personal history of antineoplastic chemotherapy: Secondary | ICD-10-CM

## 2011-06-17 NOTE — Progress Notes (Signed)
[facility] OFFICE PROGRESS NOTE  [name], MD, MD [address] [facility], [place]. [place] [address]  DIAGNOSIS: Stage IIIA in (T1b N2 M0) non-small cell lung cancer diagnosed in July 2012.  PRIOR THERAPY: #1 Status post concurrent chemoradiation with weekly carboplatin and paclitaxel, last dose was given 03/01/2011. #2 status post 3 seconds of consolidation chemotherapy was carboplatin for AUC of 5 and Alimta 500 mg/M2 giving him to see weeks, last dose was given 05/27/2011. [name]   CURRENT THERAPY: None.  INTERVAL HISTORY: [name] 77 y.o. female returns to the clinic today for followup visit accompanied her 2 daughters. The patient is feeling fine. She tolerated the last 3 cycles of consolidation chemotherapy fairly well. She denied having any significant chest pain or shortness of breath, no cough or hemoptysis. The patient denied having any significant weight loss or night sweats. She has repeat CT scan of the chest, abdomen and pelvis performed recently and she is here today for evaluation and discussion of her scan results.  MEDICAL HISTORY: Past Medical History  Diagnosis Date  . Lung mass   . Cancer     , right upper lobe  . History of radiation therapy 7/31/212-03/08/2011    right upper lobe lung adenocarcinoma  . COPD (chronic obstructive pulmonary disease)   . Hypertension   . Hypercholesterolemia   . Osteoporosis   . History of chemotherapy     carboplatin/paclitaxel    ALLERGIES:  is allergic to aleve and zyban.  MEDICATIONS:  Current Outpatient Prescriptions  Medication Sig Dispense Refill  . colesevelam (WELCHOL) 625 MG tablet Take 1,875 mg by mouth at bedtime.        [name] dexamethasone (DECADRON) 4 MG tablet Take 4 mg by mouth. Take before chemo       . docusate sodium (COLACE) 100 MG capsule Take 100 mg by mouth 2 (two) times daily as needed.        [name] losartan-hydrochlorothiazide (HYZAAR) 50-12.5 MG per tablet Take 1  tablet by mouth daily.        . prochlorperazine (COMPAZINE) 10 MG tablet Take 10 mg by mouth every 6 (six) hours as needed.        . traMADol (ULTRAM) 50 MG tablet Take 50 mg by mouth every 6 (six) hours as needed. Maximum dose= 8 tablets per day         SURGICAL HISTORY:  Past Surgical History  Procedure Date  . Abdominal hysterectomy     s/p for fibroid tumor  . Portacath placement     right ij     REVIEW OF SYSTEMS:  A comprehensive review of systems was negative.   PHYSICAL EXAMINATION: General appearance: alert, cooperative and no distress Head: Normocephalic, without obvious abnormality, atraumatic Neck: no adenopathy Lymph nodes: Cervical, supraclavicular, and axillary nodes normal. Resp: clear to auscultation bilaterally Cardio: regular rate and rhythm, S1, S2 normal, no murmur, click, rub or gallop GI: soft, non-tender; bowel sounds normal; no masses,  no organomegaly Extremities: extremities normal, atraumatic, no cyanosis or edema Neurologic: Alert and oriented X 3, normal strength and tone. Normal symmetric reflexes. Normal coordination and gait  ECOG PERFORMANCE STATUS: 0 - Asymptomatic  Blood pressure 126/70, pulse 94, temperature 97.1 F (36.2 C), temperature source Oral, height 5\' 4"  (1.626 m), weight 153 lb 1.6 oz (69.446 kg).  LABORATORY DATA: Lab Results  Component Value Date   WBC 3.6* 06/10/2011   HGB 12.0 06/10/2011   HCT 35.3 06/10/2011  MCV 98.4 06/10/2011   PLT 125* 06/10/2011      Chemistry      Component Value Date/Time   NA 140 06/10/2011 1329   K 3.8 06/10/2011 1329   CL 102 06/10/2011 1329   CO2 27 06/10/2011 1329   BUN 11 06/10/2011 1329   CREATININE 0.85 06/10/2011 1329      Component Value Date/Time   CALCIUM 9.7 06/10/2011 1329   ALKPHOS 64 06/10/2011 1329   AST 20 06/10/2011 1329   ALT 19 06/10/2011 1329   BILITOT 0.4 06/10/2011 1329       RADIOGRAPHIC STUDIES: Ct Chest W Contrast  06/10/2011  *RADIOLOGY REPORT*   Clinical Data:  Lung cancer diagnosed 2012.  CT CHEST, ABDOMEN AND PELVIS WITH CONTRAST  Technique:  Multidetector CT imaging of the chest, abdomen and pelvis was performed following the standard protocol during bolus administration of intravenous contrast.  Contrast: OMNIPAQUE IOHEXOL 300 MG/ML IV SOLN  Comparison:   CT 04/08/2011  CT CHEST  Findings:  Port in anterior chest wall.  No axillary or supraclavicular lymphadenopathy.  No mediastinal or hilar lymphadenopathy.  There is a stable low density nodule in the right lobe of the thyroid gland.  There is a focus of pleural thickening along the medial right middle lobe measuring 19 x 11 mm unchanged to decreased from 29 x 12 mm on prior.  There is mild bronchiectasis adjacent to this atelectasis/linear thickening.  There is 92 mm nodule in the superior aspect of the right lower lobe (image 23).  Nodule along the right oblique fissure (image 26) which measures 5 mm  compared to 4 mm on prior.  Two small nodules in the superior segment of the left lower lobe (image 25) less than 3 mm and unchanged.  IMPRESSION:  1.  Continued decrease in thickening along the right middle lobe mediastinum. 2.  Essentially stable pulmonary nodules.    CT ABDOMEN AND PELVIS  Findings:  No focal hepatic lesion.  There is a gallstone in the gallbladder.  The pancreas, spleen, adrenal glands, and kidneys are unchanged.  The stomach, small bowel, and colon are normal.  No diverticula the sigmoid colon.  Abdominal aorta normal caliber.  No retroperitoneal periportal lymphadenopathy.  No free fluid the pelvis.  Post hysterectomy anatomy.  No pelvic lymphadenopathy.  Fem-fem bypass graft noted. Review of  bone windows demonstrates no aggressive osseous lesions.  IMPRESSION:  1.  No evidence metastasis in the abdomen or pelvis.  Original Report Authenticated By: Genevive Bi, M.D.     ASSESSMENT: This is a very pleasant 77 years old white female with history of stage IIIA  non-small cell lung cancer status post concurrent chemoradiation followed by 3 cycles of consolidation chemotherapy. The patient has no evidence for disease progression on his recent scan. I discussed the scan results with the patient and her family.  PLAN: I recommended for her continuous observation for now. I would see her back for followup visit in 3 months with repeat CT scan of the chest. The patient was advised to call me immediately if she has any concerning symptoms in the interval.   All questions were answered. The patient knows to call the clinic with any problems, questions or concerns. We can certainly see the patient much sooner if necessary.

## 2011-06-24 ENCOUNTER — Encounter: Payer: Self-pay | Admitting: Radiation Oncology

## 2011-06-24 ENCOUNTER — Ambulatory Visit
Admission: RE | Admit: 2011-06-24 | Discharge: 2011-06-24 | Disposition: A | Discharge status: Home / Self Care | Payer: Medicare Other | Source: Ambulatory Visit | Attending: Radiation Oncology | Admitting: Radiation Oncology

## 2011-06-24 VITALS — BP 134/74 | HR 91 | Temp 98.0°F | Resp 22 | Wt 154.1 lb

## 2011-06-24 DIAGNOSIS — C3490 Malignant neoplasm of unspecified part of unspecified bronchus or lung: Secondary | ICD-10-CM

## 2011-06-24 NOTE — Progress Notes (Signed)
Follow up lung ca,  Radiation therapy txs=01/19/11-03/08/11 Married, Allergies;Zyban,Aleve,=rashes  Still gets fatigued ,no c/o pain at present 10:50 AM

## 2011-06-24 NOTE — Progress Notes (Signed)
   Department of Radiation Oncology  Phone:  [phone] Fax:        [phone]  DIAGNOSIS:  This is a 78 year old woman with stage IIIA adenocarcinoma of the right upper lobe of the lung.  INTERVAL SINCE LAST RADIATION:  4 months.  NARRATIVE:  [name] returns today for routine followup assessment.  She is essentially without complaint.  She has completed chemo.  She has no dyspnea or dysphagia.  PHYSICAL FINDINGS:  Blood pressure 134/74, pulse 91, temperature 98 F (36.7 C), temperature source Oral, resp. rate 22, weight 154 lb 1.6 oz (69.899 kg), SpO2 97.00%. NAD  RADIOGRAPHIC FINDINGS:  The patient underwent chest CT 12/20 which continued decrease in the size of the right upper lobe enhancing tumor  IMPRESSION:  Patient is stable  PLAN:   She will return to the radiation oncology clinic in 6 months.  ------------------------------------------------  Artist Pais. Kathrynn Running, M.D.

## 2011-07-08 ENCOUNTER — Ambulatory Visit (HOSPITAL_BASED_OUTPATIENT_CLINIC_OR_DEPARTMENT_OTHER): Payer: Medicare Other

## 2011-07-08 DIAGNOSIS — C3490 Malignant neoplasm of unspecified part of unspecified bronchus or lung: Secondary | ICD-10-CM

## 2011-07-08 DIAGNOSIS — Z469 Encounter for fitting and adjustment of unspecified device: Secondary | ICD-10-CM

## 2011-07-08 MED ORDER — HEPARIN SOD (PORK) LOCK FLUSH 100 UNIT/ML IV SOLN
500.0000 [IU] | Freq: Once | INTRAVENOUS | Status: AC
  Administered 2011-07-08: 500 [IU] via INTRAVENOUS
  Filled 2011-07-08: qty 5

## 2011-07-08 MED ORDER — SODIUM CHLORIDE 0.9 % IJ SOLN
10.0000 mL | INTRAMUSCULAR | Status: DC | PRN
  Administered 2011-07-08: 10 mL via INTRAVENOUS
  Filled 2011-07-08: qty 10

## 2011-07-13 ENCOUNTER — Telehealth: Payer: Self-pay | Admitting: Internal Medicine

## 2011-07-13 NOTE — Telephone Encounter (Signed)
Is it okay for her to proceed with cataract surgery? She is currently on observation and not getting any treatment. Per Dr Donnald Garre it is okay for her to have cataract surgery.  I called pt and told her .

## 2011-09-02 ENCOUNTER — Ambulatory Visit (HOSPITAL_BASED_OUTPATIENT_CLINIC_OR_DEPARTMENT_OTHER): Payer: Medicare Other

## 2011-09-02 VITALS — BP 148/84 | HR 100 | Temp 98.2°F

## 2011-09-02 DIAGNOSIS — Z452 Encounter for adjustment and management of vascular access device: Secondary | ICD-10-CM

## 2011-09-02 DIAGNOSIS — C3490 Malignant neoplasm of unspecified part of unspecified bronchus or lung: Secondary | ICD-10-CM

## 2011-09-02 DIAGNOSIS — C3410 Malignant neoplasm of upper lobe, unspecified bronchus or lung: Secondary | ICD-10-CM

## 2011-09-02 MED ORDER — SODIUM CHLORIDE 0.9 % IJ SOLN
10.0000 mL | INTRAMUSCULAR | Status: DC | PRN
  Administered 2011-09-02: 10 mL via INTRAVENOUS
  Filled 2011-09-02: qty 10

## 2011-09-02 MED ORDER — HEPARIN SOD (PORK) LOCK FLUSH 100 UNIT/ML IV SOLN
500.0000 [IU] | Freq: Once | INTRAVENOUS | Status: AC
  Administered 2011-09-02: 500 [IU] via INTRAVENOUS
  Filled 2011-09-02: qty 5

## 2011-09-16 ENCOUNTER — Other Ambulatory Visit (HOSPITAL_BASED_OUTPATIENT_CLINIC_OR_DEPARTMENT_OTHER): Payer: Medicare Other | Admitting: Lab

## 2011-09-16 ENCOUNTER — Ambulatory Visit (HOSPITAL_COMMUNITY)
Admission: RE | Admit: 2011-09-16 | Discharge: 2011-09-16 | Disposition: A | Discharge status: Home / Self Care | Payer: Medicare Other | Source: Ambulatory Visit | Attending: Internal Medicine | Admitting: Internal Medicine

## 2011-09-16 ENCOUNTER — Encounter (HOSPITAL_COMMUNITY): Payer: Self-pay

## 2011-09-16 DIAGNOSIS — K8020 Calculus of gallbladder without cholecystitis without obstruction: Secondary | ICD-10-CM | POA: Insufficient documentation

## 2011-09-16 DIAGNOSIS — C3490 Malignant neoplasm of unspecified part of unspecified bronchus or lung: Secondary | ICD-10-CM

## 2011-09-16 DIAGNOSIS — J90 Pleural effusion, not elsewhere classified: Secondary | ICD-10-CM | POA: Insufficient documentation

## 2011-09-16 DIAGNOSIS — Z9221 Personal history of antineoplastic chemotherapy: Secondary | ICD-10-CM | POA: Insufficient documentation

## 2011-09-16 DIAGNOSIS — Z923 Personal history of irradiation: Secondary | ICD-10-CM | POA: Insufficient documentation

## 2011-09-16 DIAGNOSIS — C7800 Secondary malignant neoplasm of unspecified lung: Secondary | ICD-10-CM | POA: Insufficient documentation

## 2011-09-16 LAB — CMP (CANCER CENTER ONLY)
ALT(SGPT): 18 U/L (ref 10–47)
AST: 18 U/L (ref 11–38)
Albumin: 3.5 g/dL (ref 3.3–5.5)
Alkaline Phosphatase: 62 U/L (ref 26–84)
BUN, Bld: 16 mg/dL (ref 7–22)
Calcium: 8.3 mg/dL (ref 8.0–10.3)
Chloride: 106 mEq/L (ref 98–108)
Potassium: 3.7 mEq/L (ref 3.3–4.7)
Sodium: 142 mEq/L (ref 128–145)

## 2011-09-16 MED ORDER — IOHEXOL 300 MG/ML  SOLN
80.0000 mL | Freq: Once | INTRAMUSCULAR | Status: AC | PRN
  Administered 2011-09-16: 80 mL via INTRAVENOUS

## 2011-09-20 ENCOUNTER — Ambulatory Visit (HOSPITAL_BASED_OUTPATIENT_CLINIC_OR_DEPARTMENT_OTHER): Payer: Medicare Other | Admitting: Internal Medicine

## 2011-09-20 ENCOUNTER — Telehealth: Payer: Self-pay | Admitting: Internal Medicine

## 2011-09-20 VITALS — BP 111/76 | HR 102 | Temp 97.7°F | Ht 64.0 in | Wt 158.0 lb

## 2011-09-20 DIAGNOSIS — C3490 Malignant neoplasm of unspecified part of unspecified bronchus or lung: Secondary | ICD-10-CM

## 2011-09-20 NOTE — Telephone Encounter (Signed)
pt aware of 4/4 tx appt   aom

## 2011-09-20 NOTE — Progress Notes (Signed)
[facility] [phone]   [phone]  OFFICE PROGRESS NOTE  [name], MD, MD [address] [facility], [place]. [place] [address]  DIAGNOSIS: Recurrent non-small cell lung cancer initially diagnosed as stage IIIA in (T1b N2 M0) in July 2012.   PRIOR THERAPY:  #1 Status post concurrent chemoradiation with weekly carboplatin and paclitaxel, last dose was given 03/01/2011.  #2 status post 3 seconds of consolidation chemotherapy was carboplatin for AUC of 5 and Alimta 500 mg/M2 giving him to see weeks, last dose was given 05/27/2011. [name]   CURRENT THERAPY: None, but the patient was started this weekly for a cycle of systemic chemotherapy with carboplatin for AUC of 5, paclitaxel 175 mg/M2 and Avastin 15 mg/kg every 3 weeks with Neulasta support.   INTERVAL HISTORY: [name] 78 y.o. female returns to the clinic today for 3 months followup visit accompanied by her husband and daughter. The patient is feeling fine today with no specific complaints. She denied having any significant chest pain or shortness of breath, no cough or hemoptysis. She has no weight loss or night sweats. No recent bronchitis or pneumonia. She had repeat CT scan of the chest, abdomen and pelvis performed recently and she is here today for evaluation and discussion of her scan results.  MEDICAL HISTORY: Past Medical History  Diagnosis Date  . Lung mass   . History of radiation therapy 7/31/212-03/08/2011    right upper lobe lung adenocarcinoma  . COPD (chronic obstructive pulmonary disease)   . Hypertension   . Hypercholesterolemia   . Osteoporosis   . History of chemotherapy     carboplatin/paclitaxel  . Cancer     , right upper lobe    ALLERGIES:  is allergic to aleve and zyban.  MEDICATIONS:  Current Outpatient Prescriptions  Medication Sig Dispense Refill  . aspirin 81 MG tablet Take 160 mg by mouth as needed.        . colesevelam  (WELCHOL) 625 MG tablet Take 1,875 mg by mouth at bedtime.        . docusate sodium (COLACE) 100 MG capsule Take 100 mg by mouth 2 (two) times daily as needed.        [name] losartan-hydrochlorothiazide (HYZAAR) 50-12.5 MG per tablet Take 1 tablet by mouth daily.        . traMADol (ULTRAM) 50 MG tablet Take 50 mg by mouth every 6 (six) hours as needed. Maximum dose= 8 tablets per day         SURGICAL HISTORY:  Past Surgical History  Procedure Date  . Abdominal hysterectomy     s/p for fibroid tumor  . Portacath placement     right ij     REVIEW OF SYSTEMS:  A comprehensive review of systems was negative.   PHYSICAL EXAMINATION: General appearance: alert, cooperative and no distress Neck: no adenopathy Lymph nodes: Cervical, supraclavicular, and axillary nodes normal. Resp: clear to auscultation bilaterally Cardio: regular rate and rhythm, S1, S2 normal, no murmur, click, rub or gallop GI: soft, non-tender; bowel sounds normal; no masses,  no organomegaly Extremities: extremities normal, atraumatic, no cyanosis or edema Neurologic: Alert and oriented X 3, normal strength and tone. Normal symmetric reflexes. Normal coordination and gait  ECOG PERFORMANCE STATUS: 0 - Asymptomatic  Blood pressure 111/76, pulse 102, temperature 97.7 F (36.5 C), temperature source Oral, height 5\' 4"  (1.626 m), weight 158 lb (71.668 kg).  LABORATORY DATA: Lab Results  Component Value  Date   WBC 3.6* 06/10/2011   HGB 12.0 06/10/2011   HCT 35.3 06/10/2011   MCV 98.4 06/10/2011   PLT 125* 06/10/2011      Chemistry      Component Value Date/Time   NA 142 09/16/2011 0917   NA 140 06/10/2011 1329   K 3.7 09/16/2011 0917   K 3.8 06/10/2011 1329   CL 106 09/16/2011 0917   CL 102 06/10/2011 1329   CO2 29 09/16/2011 0917   CO2 27 06/10/2011 1329   BUN 16 09/16/2011 0917   BUN 11 06/10/2011 1329   CREATININE 0.8 09/16/2011 0917   CREATININE 0.85 06/10/2011 1329      Component Value Date/Time   CALCIUM  8.3 09/16/2011 0917   CALCIUM 9.7 06/10/2011 1329   ALKPHOS 62 09/16/2011 0917   ALKPHOS 64 06/10/2011 1329   AST 18 09/16/2011 0917   AST 20 06/10/2011 1329   ALT 19 06/10/2011 1329   BILITOT 0.60 09/16/2011 0917   BILITOT 0.4 06/10/2011 1329       RADIOGRAPHIC STUDIES: Ct Chest W Contrast  09/16/2011  *RADIOLOGY REPORT*  Clinical Data: Follow-up lung carcinoma.  Previous radiation therapy and chemotherapy.  CT CHEST WITH CONTRAST  Technique:  Multidetector CT imaging of the chest was performed following the standard protocol during bolus administration of intravenous contrast.  Contrast:  80 ml Omnipaque-300  Comparison: 06/10/2011  Findings: A small less than 1 cm pulmonary nodules are seen bilaterally throughout both lungs which show increased in size and number since previous exam.  These are consistent with pulmonary metastases.  Paramediastinal radiation changes are noted in the medial right lung.  There is a new focal area of airspace opacity in the superior and medial right lower lobe.  This could represent worsening radiation pneumonitis versus pneumonia.  A small right pleural effusion is increased in size.  There is no evidence of centrally obstructing hilar mass or endobronchial lesion.  No hilar or mediastinal lymphadenopathy identified.  No adenopathy seen elsewhere within the thorax.  No suspicious bone lesions are identified.   Adrenal glands remain normal in appearance.  Incidental note is made of a tiny calcified gallstone.  IMPRESSION:  1.  Interval progression of diffuse bilateral pulmonary metastases. 2.  New focal airspace disease in the superior and medial right lower lobe.  Differential diagnosis includes acute radiation pneumonitis and pneumonia. 3.  No evidence of lymphadenopathy. 4.  New tiny right pleural effusion. 5.  Cholelithiasis incidentally noted.  Original Report Authenticated By: Danae Orleans, M.D.    ASSESSMENT: This is a very pleasant 78 years old white female  with recurrent non-small cell lung cancer presented with bilateral pulmonary nodules on the recent CT scan of the chest.  PLAN: I discussed the scan results and showed the images to the patient and her family. I recommended for her to resume systemic chemotherapy with carboplatin, paclitaxel and Avastin. This would be given every 3 weeks with Neulasta support. I discussed with the patient adverse effect of this treatment including but not limited to alopecia, myelosuppression, peripheral no, nausea and vomiting, liver or renal dysfunction as well as the blackbox warning for Avastin including pulmonary hemorrhage, GI perforation as well as wound healing daily. The patient agreed to proceed with treatment as planned. She is expected to start the first cycle of this treatment later this week. She would come back for followup visit in 3 weeks for evaluation and management any adverse effect of her treatment. She was advised  to call immediately if she has any concerning symptoms in the interval.  All questions were answered. The patient knows to call the clinic with any problems, questions or concerns. We can certainly see the patient much sooner if necessary.  I spent 20 minutes counseling the patient face to face. The total time spent in the appointment was 35 minutes.

## 2011-09-23 ENCOUNTER — Ambulatory Visit (HOSPITAL_BASED_OUTPATIENT_CLINIC_OR_DEPARTMENT_OTHER): Payer: Medicare Other

## 2011-09-23 ENCOUNTER — Other Ambulatory Visit: Payer: Self-pay | Admitting: *Deleted

## 2011-09-23 VITALS — BP 124/84 | HR 91 | Temp 98.0°F

## 2011-09-23 DIAGNOSIS — Z5112 Encounter for antineoplastic immunotherapy: Secondary | ICD-10-CM

## 2011-09-23 DIAGNOSIS — C3410 Malignant neoplasm of upper lobe, unspecified bronchus or lung: Secondary | ICD-10-CM

## 2011-09-23 DIAGNOSIS — C3490 Malignant neoplasm of unspecified part of unspecified bronchus or lung: Secondary | ICD-10-CM

## 2011-09-23 DIAGNOSIS — Z5111 Encounter for antineoplastic chemotherapy: Secondary | ICD-10-CM

## 2011-09-23 MED ORDER — ONDANSETRON 16 MG/50ML IVPB (CHCC)
16.0000 mg | Freq: Once | INTRAVENOUS | Status: AC
  Administered 2011-09-23: 16 mg via INTRAVENOUS

## 2011-09-23 MED ORDER — DIPHENHYDRAMINE HCL 50 MG/ML IJ SOLN
50.0000 mg | Freq: Once | INTRAMUSCULAR | Status: AC
  Administered 2011-09-23: 50 mg via INTRAVENOUS

## 2011-09-23 MED ORDER — SODIUM CHLORIDE 0.9 % IJ SOLN
10.0000 mL | INTRAMUSCULAR | Status: DC | PRN
  Administered 2011-09-23: 10 mL
  Filled 2011-09-23: qty 10

## 2011-09-23 MED ORDER — HEPARIN SOD (PORK) LOCK FLUSH 100 UNIT/ML IV SOLN
500.0000 [IU] | Freq: Once | INTRAVENOUS | Status: AC | PRN
  Administered 2011-09-23: 500 [IU]
  Filled 2011-09-23: qty 5

## 2011-09-23 MED ORDER — SODIUM CHLORIDE 0.9 % IV SOLN
15.0000 mg/kg | INTRAVENOUS | Status: DC
  Administered 2011-09-23: 1075 mg via INTRAVENOUS
  Filled 2011-09-23: qty 43

## 2011-09-23 MED ORDER — FAMOTIDINE IN NACL 20-0.9 MG/50ML-% IV SOLN
20.0000 mg | Freq: Once | INTRAVENOUS | Status: AC
  Administered 2011-09-23: 20 mg via INTRAVENOUS

## 2011-09-23 MED ORDER — PACLITAXEL CHEMO INJECTION 300 MG/50ML
175.0000 mg/m2 | Freq: Once | INTRAVENOUS | Status: AC
  Administered 2011-09-23: 318 mg via INTRAVENOUS
  Filled 2011-09-23: qty 53

## 2011-09-23 MED ORDER — DEXAMETHASONE SODIUM PHOSPHATE 4 MG/ML IJ SOLN
20.0000 mg | Freq: Once | INTRAMUSCULAR | Status: AC
  Administered 2011-09-23: 20 mg via INTRAVENOUS

## 2011-09-23 MED ORDER — SODIUM CHLORIDE 0.9 % IV SOLN: Freq: Once | INTRAVENOUS | Status: DC

## 2011-09-23 MED ORDER — SODIUM CHLORIDE 0.9 % IV SOLN
458.5000 mg | Freq: Once | INTRAVENOUS | Status: AC
  Administered 2011-09-23: 460 mg via INTRAVENOUS
  Filled 2011-09-23: qty 46

## 2011-09-24 ENCOUNTER — Ambulatory Visit (HOSPITAL_BASED_OUTPATIENT_CLINIC_OR_DEPARTMENT_OTHER): Payer: Medicare Other

## 2011-09-24 VITALS — BP 104/64 | HR 94 | Temp 97.0°F

## 2011-09-24 DIAGNOSIS — C3410 Malignant neoplasm of upper lobe, unspecified bronchus or lung: Secondary | ICD-10-CM

## 2011-09-24 DIAGNOSIS — C3490 Malignant neoplasm of unspecified part of unspecified bronchus or lung: Secondary | ICD-10-CM

## 2011-09-24 MED ORDER — PEGFILGRASTIM INJECTION 6 MG/0.6ML
6.0000 mg | Freq: Once | SUBCUTANEOUS | Status: AC
Start: 2011-09-24 — End: 2011-09-24
  Administered 2011-09-24: 6 mg via SUBCUTANEOUS
  Filled 2011-09-24: qty 0.6

## 2011-09-30 ENCOUNTER — Other Ambulatory Visit: Payer: Self-pay | Admitting: Medical Oncology

## 2011-09-30 ENCOUNTER — Other Ambulatory Visit (HOSPITAL_BASED_OUTPATIENT_CLINIC_OR_DEPARTMENT_OTHER): Payer: Medicare Other

## 2011-09-30 ENCOUNTER — Ambulatory Visit
Admission: RE | Admit: 2011-09-30 | Discharge: 2011-09-30 | Disposition: A | Discharge status: Home / Self Care | Payer: Medicare Other | Source: Ambulatory Visit | Attending: Internal Medicine | Admitting: Internal Medicine

## 2011-09-30 ENCOUNTER — Other Ambulatory Visit: Payer: Self-pay | Admitting: Internal Medicine

## 2011-09-30 ENCOUNTER — Encounter: Payer: Self-pay | Admitting: Medical Oncology

## 2011-09-30 DIAGNOSIS — C3490 Malignant neoplasm of unspecified part of unspecified bronchus or lung: Secondary | ICD-10-CM

## 2011-09-30 DIAGNOSIS — F40240 Claustrophobia: Secondary | ICD-10-CM

## 2011-09-30 DIAGNOSIS — C3410 Malignant neoplasm of upper lobe, unspecified bronchus or lung: Secondary | ICD-10-CM

## 2011-09-30 LAB — CBC WITH DIFFERENTIAL/PLATELET
BASO%: 1.5 % (ref 0.0–2.0)
Basophils Absolute: 0.1 10*3/uL (ref 0.0–0.1)
EOS%: 5.3 % (ref 0.0–7.0)
HCT: 38.5 % (ref 34.8–46.6)
HGB: 13.1 g/dL (ref 11.6–15.9)
MCH: 30.2 pg (ref 25.1–34.0)
MCHC: 34 g/dL (ref 31.5–36.0)
MCV: 89 fL (ref 79.5–101.0)
MONO%: 7.6 % (ref 0.0–14.0)
NEUT%: 80.1 % — ABNORMAL HIGH (ref 38.4–76.8)
RDW: 13.4 % (ref 11.2–14.5)
lymph#: 0.5 10*3/uL — ABNORMAL LOW (ref 0.9–3.3)

## 2011-09-30 LAB — COMPREHENSIVE METABOLIC PANEL
AST: 16 U/L (ref 0–37)
Alkaline Phosphatase: 129 U/L — ABNORMAL HIGH (ref 39–117)
BUN: 13 mg/dL (ref 6–23)
Glucose, Bld: 184 mg/dL — ABNORMAL HIGH (ref 70–99)
Total Bilirubin: 0.3 mg/dL (ref 0.3–1.2)

## 2011-09-30 MED ORDER — GADOBENATE DIMEGLUMINE 529 MG/ML IV SOLN: 14.0000 mL | Freq: Once | INTRAVENOUS | Status: AC | PRN

## 2011-09-30 MED ORDER — LORAZEPAM 1 MG PO TABS: 1.0000 mg | ORAL_TABLET | Freq: Once | ORAL | Status: AC

## 2011-09-30 NOTE — Telephone Encounter (Signed)
Requests something for claustrophobia prior to scan RX given to pt.

## 2011-09-30 NOTE — Progress Notes (Signed)
I received a  Walk in note re pt who is here for labs . Pt reports loss of balance x 3 days when upright and pain in lower back. Her feet are also hurting and she is on Neurontin. She states the balance issue is not related to back or foot pain. She feels it is in her head. Dr Donnald Garre notified and MRI of brain ordered. Pt notified and assisted to scheduling dept.

## 2011-10-01 ENCOUNTER — Encounter: Payer: Self-pay | Admitting: Medical Oncology

## 2011-10-01 NOTE — Progress Notes (Signed)
Pt requests rx for wig. Mailed to pt

## 2011-10-06 ENCOUNTER — Telehealth: Payer: Self-pay | Admitting: Medical Oncology

## 2011-10-06 NOTE — Telephone Encounter (Signed)
Per Dr Donnald Garre he can change her to neupogen instead of neulasta after next chemo.

## 2011-10-06 NOTE — Telephone Encounter (Signed)
Request results of MRI. I read result of MRI to pt. She thinks maybe the Neulasta may have caused balance issue with stiff legs , and bone pain. She wants to know if she can take Neupogen for longer instead of Neulasta.? I told her I will talk to Dr Donnald Garre.

## 2011-10-07 ENCOUNTER — Other Ambulatory Visit (HOSPITAL_BASED_OUTPATIENT_CLINIC_OR_DEPARTMENT_OTHER): Payer: Medicare Other | Admitting: Lab

## 2011-10-07 DIAGNOSIS — C3490 Malignant neoplasm of unspecified part of unspecified bronchus or lung: Secondary | ICD-10-CM

## 2011-10-07 DIAGNOSIS — Z5111 Encounter for antineoplastic chemotherapy: Secondary | ICD-10-CM

## 2011-10-07 LAB — CBC WITH DIFFERENTIAL/PLATELET
Basophils Absolute: 0 10*3/uL (ref 0.0–0.1)
Eosinophils Absolute: 0.1 10*3/uL (ref 0.0–0.5)
HCT: 39.1 % (ref 34.8–46.6)
HGB: 13 g/dL (ref 11.6–15.9)
LYMPH%: 6 % — ABNORMAL LOW (ref 14.0–49.7)
MCH: 29.6 pg (ref 25.1–34.0)
MCV: 89.2 fL (ref 79.5–101.0)
MONO%: 7.9 % (ref 0.0–14.0)
NEUT#: 5.7 10*3/uL (ref 1.5–6.5)
NEUT%: 83.4 % — ABNORMAL HIGH (ref 38.4–76.8)
Platelets: 163 10*3/uL (ref 145–400)

## 2011-10-07 LAB — COMPREHENSIVE METABOLIC PANEL
BUN: 12 mg/dL (ref 6–23)
CO2: 28 mEq/L (ref 19–32)
Creatinine, Ser: 0.73 mg/dL (ref 0.50–1.10)
Glucose, Bld: 88 mg/dL (ref 70–99)
Sodium: 142 mEq/L (ref 135–145)
Total Bilirubin: 0.4 mg/dL (ref 0.3–1.2)
Total Protein: 6.3 g/dL (ref 6.0–8.3)

## 2011-10-07 LAB — UA PROTEIN, DIPSTICK - CHCC: Protein, ur: NEGATIVE mg/dL

## 2011-10-13 ENCOUNTER — Ambulatory Visit (HOSPITAL_BASED_OUTPATIENT_CLINIC_OR_DEPARTMENT_OTHER): Payer: Medicare Other

## 2011-10-13 ENCOUNTER — Other Ambulatory Visit (HOSPITAL_BASED_OUTPATIENT_CLINIC_OR_DEPARTMENT_OTHER): Payer: Medicare Other | Admitting: Lab

## 2011-10-13 ENCOUNTER — Telehealth: Payer: Self-pay | Admitting: Internal Medicine

## 2011-10-13 ENCOUNTER — Other Ambulatory Visit: Payer: Self-pay | Admitting: Emergency Medicine

## 2011-10-13 ENCOUNTER — Encounter: Payer: Self-pay | Admitting: Physician Assistant

## 2011-10-13 ENCOUNTER — Ambulatory Visit: Payer: Medicare Other | Admitting: Physician Assistant

## 2011-10-13 VITALS — BP 116/73 | HR 93 | Temp 97.1°F

## 2011-10-13 VITALS — BP 134/80 | HR 96 | Temp 96.8°F | Ht 64.0 in | Wt 157.0 lb

## 2011-10-13 DIAGNOSIS — C3490 Malignant neoplasm of unspecified part of unspecified bronchus or lung: Secondary | ICD-10-CM

## 2011-10-13 DIAGNOSIS — Z5111 Encounter for antineoplastic chemotherapy: Secondary | ICD-10-CM

## 2011-10-13 LAB — COMPREHENSIVE METABOLIC PANEL
AST: 16 U/L (ref 0–37)
Alkaline Phosphatase: 75 U/L (ref 39–117)
BUN: 13 mg/dL (ref 6–23)
Calcium: 9.4 mg/dL (ref 8.4–10.5)
Creatinine, Ser: 0.77 mg/dL (ref 0.50–1.10)
Glucose, Bld: 83 mg/dL (ref 70–99)

## 2011-10-13 LAB — CBC WITH DIFFERENTIAL/PLATELET
BASO%: 1.4 % (ref 0.0–2.0)
Basophils Absolute: 0.1 10*3/uL (ref 0.0–0.1)
EOS%: 2.2 % (ref 0.0–7.0)
Eosinophils Absolute: 0.1 10*3/uL (ref 0.0–0.5)
HCT: 39.6 % (ref 34.8–46.6)
HGB: 13.3 g/dL (ref 11.6–15.9)
LYMPH%: 6.9 % — ABNORMAL LOW (ref 14.0–49.7)
MCH: 29.2 pg (ref 25.1–34.0)
MCHC: 33.6 g/dL (ref 31.5–36.0)
MCV: 86.8 fL (ref 79.5–101.0)
MONO#: 0.7 10*3/uL (ref 0.1–0.9)
MONO%: 11.2 % (ref 0.0–14.0)
NEUT#: 4.6 10*3/uL (ref 1.5–6.5)
NEUT%: 78.3 % — ABNORMAL HIGH (ref 38.4–76.8)
Platelets: 189 10*3/uL (ref 145–400)
RBC: 4.56 10*6/uL (ref 3.70–5.45)
RDW: 14.2 % (ref 11.2–14.5)
WBC: 5.8 10*3/uL (ref 3.9–10.3)
lymph#: 0.4 10*3/uL — ABNORMAL LOW (ref 0.9–3.3)
nRBC: 0 % (ref 0–0)

## 2011-10-13 LAB — UA PROTEIN, DIPSTICK - CHCC: Protein, ur: NEGATIVE mg/dL

## 2011-10-13 MED ORDER — DIPHENHYDRAMINE HCL 50 MG/ML IJ SOLN
50.0000 mg | Freq: Once | INTRAMUSCULAR | Status: AC
  Administered 2011-10-13: 50 mg via INTRAVENOUS

## 2011-10-13 MED ORDER — FAMOTIDINE IN NACL 20-0.9 MG/50ML-% IV SOLN
20.0000 mg | Freq: Once | INTRAVENOUS | Status: AC
  Administered 2011-10-13: 20 mg via INTRAVENOUS

## 2011-10-13 MED ORDER — CARBOPLATIN CHEMO INJECTION 600 MG/60ML
460.0000 mg | Freq: Once | INTRAVENOUS | Status: AC
  Administered 2011-10-13: 460 mg via INTRAVENOUS
  Filled 2011-10-13: qty 46

## 2011-10-13 MED ORDER — SODIUM CHLORIDE 0.9 % IJ SOLN
10.0000 mL | INTRAMUSCULAR | Status: DC | PRN
  Administered 2011-10-13: 10 mL
  Filled 2011-10-13: qty 10

## 2011-10-13 MED ORDER — SODIUM CHLORIDE 0.9 % IV SOLN
Freq: Once | INTRAVENOUS | Status: AC
  Administered 2011-10-13: 11:00:00 via INTRAVENOUS

## 2011-10-13 MED ORDER — ONDANSETRON 16 MG/50ML IVPB (CHCC)
16.0000 mg | Freq: Once | INTRAVENOUS | Status: AC
  Administered 2011-10-13: 16 mg via INTRAVENOUS

## 2011-10-13 MED ORDER — SODIUM CHLORIDE 0.9 % IJ SOLN
100.0000 ug | Freq: Once | INTRAVENOUS | Status: AC
  Administered 2011-10-13: 0.1 mg via INTRADERMAL
  Filled 2011-10-13: qty 0.01

## 2011-10-13 MED ORDER — TRAMADOL HCL 50 MG PO TABS: 50.0000 mg | ORAL_TABLET | Freq: Four times a day (QID) | ORAL | Status: DC | PRN

## 2011-10-13 MED ORDER — PACLITAXEL CHEMO INJECTION 300 MG/50ML
175.0000 mg/m2 | Freq: Once | INTRAVENOUS | Status: AC
  Administered 2011-10-13: 318 mg via INTRAVENOUS
  Filled 2011-10-13: qty 53

## 2011-10-13 MED ORDER — HEPARIN SOD (PORK) LOCK FLUSH 100 UNIT/ML IV SOLN
500.0000 [IU] | Freq: Once | INTRAVENOUS | Status: AC | PRN
  Administered 2011-10-13: 500 [IU]
  Filled 2011-10-13: qty 5

## 2011-10-13 MED ORDER — SODIUM CHLORIDE 0.9 % IV SOLN
15.0000 mg/kg | INTRAVENOUS | Status: DC
  Administered 2011-10-13: 1075 mg via INTRAVENOUS
  Filled 2011-10-13: qty 43

## 2011-10-13 MED ORDER — DEXAMETHASONE SODIUM PHOSPHATE 4 MG/ML IJ SOLN
20.0000 mg | Freq: Once | INTRAMUSCULAR | Status: AC
  Administered 2011-10-13: 20 mg via INTRAVENOUS

## 2011-10-13 NOTE — Telephone Encounter (Signed)
Gave calendar for May 2013 and June 2013

## 2011-10-13 NOTE — Progress Notes (Signed)
1058- Carbo test dose site looks unremarkable. No signs or redness or swelling noted. MAW RN 1108- Carbo test dose site unremarkable. No redness noted or swelling. MAW RN 1123- Carbo test dose site unremarkable. No redness noted or swelling. MAW RN

## 2011-10-13 NOTE — Progress Notes (Signed)
[facility] [phone]   Fax:([phone]  OFFICE PROGRESS NOTE  [name], MD, MD [address] [facility], [place]. [place] [address]  DIAGNOSIS: Recurrent non-small cell lung cancer initially diagnosed as stage IIIA in (T1b N2 M0) in July 2012.   PRIOR THERAPY:  #1 Status post concurrent chemoradiation with weekly carboplatin and paclitaxel, last dose was given 03/01/2011.  #2 status post 3 seconds of consolidation chemotherapy was carboplatin for AUC of 5 and Alimta 500 mg/M2 giving him to see weeks, last dose was given 05/27/2011. [name]   CURRENT THERAPY: None, but the patient was started this weekly for a cycle of systemic chemotherapy with carboplatin for AUC of 5, paclitaxel 175 mg/M2 and Avastin 15 mg/kg every 3 weeks with Neulasta support.   INTERVAL HISTORY: [name] 78 y.o. female returns to the clinic today for 3 months followup visit accompanied by her husband. She tolerated her first cycle of systemic chemotherapy with carboplatin paclitaxel and Avastin relatively well however did not do well with the Neulasta injections secondary to very intense pain. She would be interested in Neupogen injections this should they be necessary. She reports an occasional dry cough and is currently having a lot of postnasal drip with clear secretions secondary to her seasonal allergies. She voiced no other specific complaints. She denied having any significant chest pain or shortness of breath,  or hemoptysis. She has no weight loss or night sweats. She does request a refill for her Ultram that was helpful with the pain associated with the Neulasta injection as well as she states she takes this for some osteoarthritic pain.  MEDICAL HISTORY: Past Medical History  Diagnosis Date  . Lung mass   . History of radiation therapy 7/31/212-03/08/2011    right upper lobe lung adenocarcinoma  . COPD (chronic obstructive pulmonary disease)     . Hypertension   . Hypercholesterolemia   . Osteoporosis   . History of chemotherapy     carboplatin/paclitaxel  . Cancer     , right upper lobe    ALLERGIES:  is allergic to aleve and zyban.  MEDICATIONS:  Current Outpatient Prescriptions  Medication Sig Dispense Refill  . aspirin 81 MG tablet Take 160 mg by mouth as needed.        . colesevelam (WELCHOL) 625 MG tablet Take 1,875 mg by mouth at bedtime.        . docusate sodium (COLACE) 100 MG capsule Take 100 mg by mouth 2 (two) times daily as needed.        . lidocaine-prilocaine (EMLA) cream       . LORazepam (ATIVAN) 1 MG tablet Taken prior to CT scans      . losartan-hydrochlorothiazide (HYZAAR) 50-12.5 MG per tablet Take 1 tablet by mouth daily.        . traMADol (ULTRAM) 50 MG tablet Take 50 mg by mouth every 6 (six) hours as needed. Maximum dose= 8 tablets per day        No current facility-administered medications for this visit.   Facility-Administered Medications Ordered in Other Visits  Medication Dose Route Frequency Provider Last Rate Last Dose  . 0.9 %  sodium chloride infusion   Intravenous Once Conni Slipper, PA 20 mL/hr at 10/13/11 1050    . bevacizumab (AVASTIN) 1,075 mg in sodium chloride 0.9 % 100 mL chemo infusion  15 mg/kg (Treatment Plan Actual) Intravenous Q21 days Conni Slipper, PA 286 mL/hr at  10/13/11 1552 1,075 mg at 10/13/11 1552  . CARBOplatin (PARAPLATIN) 460 mg in sodium chloride 0.9 % 250 mL chemo infusion  460 mg Intravenous Once [employer], PA   460 mg at 10/13/11 1515  . CARBOplatin chemo intradermal Test Dose 100 mcg  100 mcg Intradermal Once Conni Slipper, PA   0.1 mg at 10/13/11 1053  . dexamethasone (DECADRON) injection 20 mg  20 mg Intravenous Once Conni Slipper, PA   20 mg at 10/13/11 1129  . diphenhydrAMINE (BENADRYL) injection 50 mg  50 mg Intravenous Once Conni Slipper, PA   50 mg at 10/13/11 1129  . famotidine (PEPCID) IVPB 20 mg  20 mg Intravenous Once Conni Slipper, PA   20 mg at 10/13/11 1148  . heparin lock flush 100 unit/mL  500 Units Intracatheter Once PRN Conni Slipper, PA   500 Units at 10/13/11 1632  . ondansetron (ZOFRAN) IVPB 16 mg  16 mg Intravenous Once Conni Slipper, PA   16 mg at 10/13/11 1129  . PACLitaxel (TAXOL) 318 mg in dextrose 5 % 500 mL chemo infusion (> 80mg /m2)  175 mg/m2 (Treatment Plan Actual) Intravenous Once Conni Slipper, PA   318 mg at 10/13/11 1208  . sodium chloride 0.9 % injection 10 mL  10 mL Intracatheter PRN Conni Slipper, PA   10 mL at 10/13/11 1632    SURGICAL HISTORY:  Past Surgical History  Procedure Date  . Abdominal hysterectomy     s/p for fibroid tumor  . Portacath placement     right ij     REVIEW OF SYSTEMS:  A comprehensive review of systems was negative.   PHYSICAL EXAMINATION: General appearance: alert, cooperative and no distress Neck: no adenopathy Lymph nodes: Cervical, supraclavicular, and axillary nodes normal. Resp: clear to auscultation bilaterally Cardio: regular rate and rhythm, S1, S2 normal, no murmur, click, rub or gallop GI: soft, non-tender; bowel sounds normal; no masses,  no organomegaly Extremities: extremities normal, atraumatic, no cyanosis or edema Neurologic: Alert and oriented X 3, normal strength and tone. Normal symmetric reflexes. Normal coordination and gait  ECOG PERFORMANCE STATUS: 0 - Asymptomatic  Blood pressure 134/80, pulse 96, temperature 96.8 F (36 C), temperature source Oral, height 5\' 4"  (1.626 m), weight 157 lb (71.215 kg).  LABORATORY DATA: Lab Results  Component Value Date   WBC 5.8 10/13/2011   HGB 13.3 10/13/2011   HCT 39.6 10/13/2011   MCV 86.8 10/13/2011   PLT 189 10/13/2011      Chemistry      Component Value Date/Time   NA 142 10/07/2011 1113   NA 142 09/16/2011 0917   K 3.9 10/07/2011 1113   K 3.7 09/16/2011 0917   CL 105 10/07/2011 1113   CL 106 09/16/2011 0917   CO2 28 10/07/2011 1113   CO2 29 09/16/2011 0917   BUN 12  10/07/2011 1113   BUN 16 09/16/2011 0917   CREATININE 0.73 10/07/2011 1113   CREATININE 0.8 09/16/2011 0917      Component Value Date/Time   CALCIUM 8.9 10/07/2011 1113   CALCIUM 8.3 09/16/2011 0917   ALKPHOS 90 10/07/2011 1113   ALKPHOS 62 09/16/2011 0917   AST 15 10/07/2011 1113   AST 18 09/16/2011 0917   ALT 17 10/07/2011 1113   BILITOT 0.4 10/07/2011 1113   BILITOT 0.60 09/16/2011 0917       RADIOGRAPHIC STUDIES: Ct Chest W Contrast  09/16/2011  *RADIOLOGY REPORT*  Clinical Data:  Follow-up lung carcinoma.  Previous radiation therapy and chemotherapy.  CT CHEST WITH CONTRAST  Technique:  Multidetector CT imaging of the chest was performed following the standard protocol during bolus administration of intravenous contrast.  Contrast:  80 ml Omnipaque-300  Comparison: 06/10/2011  Findings: A small less than 1 cm pulmonary nodules are seen bilaterally throughout both lungs which show increased in size and number since previous exam.  These are consistent with pulmonary metastases.  Paramediastinal radiation changes are noted in the medial right lung.  There is a new focal area of airspace opacity in the superior and medial right lower lobe.  This could represent worsening radiation pneumonitis versus pneumonia.  A small right pleural effusion is increased in size.  There is no evidence of centrally obstructing hilar mass or endobronchial lesion.  No hilar or mediastinal lymphadenopathy identified.  No adenopathy seen elsewhere within the thorax.  No suspicious bone lesions are identified.   Adrenal glands remain normal in appearance.  Incidental note is made of a tiny calcified gallstone.  IMPRESSION:  1.  Interval progression of diffuse bilateral pulmonary metastases. 2.  New focal airspace disease in the superior and medial right lower lobe.  Differential diagnosis includes acute radiation pneumonitis and pneumonia. 3.  No evidence of lymphadenopathy. 4.  New tiny right pleural effusion. 5.  Cholelithiasis  incidentally noted.  Original Report Authenticated By: Danae Orleans, M.D.    ASSESSMENT/PLAN: This is a very pleasant 78 years old white female with recurrent non-small cell lung cancer presented with bilateral pulmonary nodules on the recent CT scan of the chest. The patient was discussed with Dr. Arbutus Ped. She'll proceed with her second cycle of systemic chemotherapy with carboplatin, paclitaxel and Avastin today as scheduled. We will closely monitor her weekly labs and should it be necessary we'll treat with Neupogen injections for 5 days for any neutropenia that may arise. She was given a prescription for Ultram 50 mg tablets one by mouth every 6 hours as needed for pain a total of 60 with no refill. She'll return in 3 weeks prior to her third cycle of systemic chemotherapy with carboplatin, paclitaxel and Avastin with repeat CBC differential and C. met and urine protein.  Laural Benes, Lailoni Baquera E, PA-C   All questions were answered. The patient knows to call the clinic with any problems, questions or concerns. We can certainly see the patient much sooner if necessary.

## 2011-10-14 ENCOUNTER — Ambulatory Visit: Payer: Medicare Other

## 2011-10-18 ENCOUNTER — Telehealth: Payer: Self-pay | Admitting: *Deleted

## 2011-10-18 ENCOUNTER — Other Ambulatory Visit: Payer: Self-pay | Admitting: *Deleted

## 2011-10-18 DIAGNOSIS — M79673 Pain in unspecified foot: Secondary | ICD-10-CM

## 2011-10-18 MED ORDER — OXYCODONE-ACETAMINOPHEN 5-325 MG PO TABS: 1.0000 | ORAL_TABLET | Freq: Four times a day (QID) | ORAL | Status: AC | PRN

## 2011-10-18 NOTE — Telephone Encounter (Signed)
PT. RECEIVED CARBOPLATIN, TAXOL, AND AVASTIN. SHE IS ALSO HAVING DIFFICULTY WALKING. PT.HAS TAKEN TRAMADOL BUT NO RELIEF. SUGGESTIONS? THIS NOTE TO Volanda Napoleon

## 2011-10-18 NOTE — Telephone Encounter (Signed)
Per Dr Donnald Garre, pt can have percocet for pain in feet and we will assess her at next f/u appt for possible neuropathy.  Pt verbalized understanding.  SLJ

## 2011-10-21 ENCOUNTER — Other Ambulatory Visit (HOSPITAL_BASED_OUTPATIENT_CLINIC_OR_DEPARTMENT_OTHER): Payer: Medicare Other | Admitting: Lab

## 2011-10-21 DIAGNOSIS — C3490 Malignant neoplasm of unspecified part of unspecified bronchus or lung: Secondary | ICD-10-CM

## 2011-10-21 DIAGNOSIS — Z5111 Encounter for antineoplastic chemotherapy: Secondary | ICD-10-CM

## 2011-10-21 LAB — CBC WITH DIFFERENTIAL/PLATELET
BASO%: 1 % (ref 0.0–2.0)
Basophils Absolute: 0 10*3/uL (ref 0.0–0.1)
EOS%: 5.5 % (ref 0.0–7.0)
MCH: 28.9 pg (ref 25.1–34.0)
MCHC: 32.5 g/dL (ref 31.5–36.0)
MCV: 88.9 fL (ref 79.5–101.0)
MONO%: 3.9 % (ref 0.0–14.0)
RDW: 15 % — ABNORMAL HIGH (ref 11.2–14.5)
lymph#: 0.2 10*3/uL — ABNORMAL LOW (ref 0.9–3.3)

## 2011-10-21 LAB — UA PROTEIN, DIPSTICK - CHCC: Protein, ur: NEGATIVE mg/dL

## 2011-10-21 LAB — COMPREHENSIVE METABOLIC PANEL
AST: 16 U/L (ref 0–37)
Albumin: 4 g/dL (ref 3.5–5.2)
Alkaline Phosphatase: 63 U/L (ref 39–117)
BUN: 13 mg/dL (ref 6–23)
Glucose, Bld: 166 mg/dL — ABNORMAL HIGH (ref 70–99)
Potassium: 4.1 mEq/L (ref 3.5–5.3)
Sodium: 138 mEq/L (ref 135–145)
Total Bilirubin: 0.5 mg/dL (ref 0.3–1.2)
Total Protein: 6.1 g/dL (ref 6.0–8.3)

## 2011-10-28 ENCOUNTER — Encounter: Payer: Self-pay | Admitting: Medical Oncology

## 2011-10-28 ENCOUNTER — Other Ambulatory Visit (HOSPITAL_BASED_OUTPATIENT_CLINIC_OR_DEPARTMENT_OTHER): Payer: Medicare Other | Admitting: Lab

## 2011-10-28 DIAGNOSIS — Z5111 Encounter for antineoplastic chemotherapy: Secondary | ICD-10-CM

## 2011-10-28 DIAGNOSIS — C3490 Malignant neoplasm of unspecified part of unspecified bronchus or lung: Secondary | ICD-10-CM

## 2011-10-28 LAB — COMPREHENSIVE METABOLIC PANEL
ALT: 16 U/L (ref 0–35)
AST: 16 U/L (ref 0–37)
Albumin: 4 g/dL (ref 3.5–5.2)
Alkaline Phosphatase: 67 U/L (ref 39–117)
Potassium: 4 mEq/L (ref 3.5–5.3)
Sodium: 137 mEq/L (ref 135–145)
Total Protein: 6.3 g/dL (ref 6.0–8.3)

## 2011-10-28 LAB — CBC WITH DIFFERENTIAL/PLATELET
BASO%: 3 % — ABNORMAL HIGH (ref 0.0–2.0)
EOS%: 7.1 % — ABNORMAL HIGH (ref 0.0–7.0)
HGB: 12.6 g/dL (ref 11.6–15.9)
MCH: 29.7 pg (ref 25.1–34.0)
MCHC: 33.4 g/dL (ref 31.5–36.0)
RDW: 15.8 % — ABNORMAL HIGH (ref 11.2–14.5)
WBC: 1.8 10*3/uL — ABNORMAL LOW (ref 3.9–10.3)
lymph#: 0.3 10*3/uL — ABNORMAL LOW (ref 0.9–3.3)

## 2011-10-28 NOTE — Progress Notes (Signed)
Notified patient of Neutrapenicg precautions-/when to call MD- patient and or family voice understanding.

## 2011-11-03 ENCOUNTER — Ambulatory Visit (HOSPITAL_BASED_OUTPATIENT_CLINIC_OR_DEPARTMENT_OTHER): Payer: Medicare Other

## 2011-11-03 ENCOUNTER — Other Ambulatory Visit: Payer: Medicare Other | Admitting: Lab

## 2011-11-03 ENCOUNTER — Telehealth: Payer: Self-pay | Admitting: Internal Medicine

## 2011-11-03 ENCOUNTER — Other Ambulatory Visit (HOSPITAL_BASED_OUTPATIENT_CLINIC_OR_DEPARTMENT_OTHER): Payer: Medicare Other | Admitting: Lab

## 2011-11-03 ENCOUNTER — Ambulatory Visit (HOSPITAL_BASED_OUTPATIENT_CLINIC_OR_DEPARTMENT_OTHER): Payer: Medicare Other | Admitting: Physician Assistant

## 2011-11-03 VITALS — BP 124/75

## 2011-11-03 VITALS — BP 139/83 | HR 106 | Temp 96.7°F | Ht 64.0 in | Wt 154.0 lb

## 2011-11-03 DIAGNOSIS — C3410 Malignant neoplasm of upper lobe, unspecified bronchus or lung: Secondary | ICD-10-CM

## 2011-11-03 DIAGNOSIS — C3490 Malignant neoplasm of unspecified part of unspecified bronchus or lung: Secondary | ICD-10-CM

## 2011-11-03 DIAGNOSIS — Z5111 Encounter for antineoplastic chemotherapy: Secondary | ICD-10-CM

## 2011-11-03 LAB — CBC WITH DIFFERENTIAL/PLATELET
BASO%: 0.9 % (ref 0.0–2.0)
Eosinophils Absolute: 0.2 10*3/uL (ref 0.0–0.5)
LYMPH%: 10.3 % — ABNORMAL LOW (ref 14.0–49.7)
MCHC: 34 g/dL (ref 31.5–36.0)
MCV: 86.9 fL (ref 79.5–101.0)
MONO%: 8.8 % (ref 0.0–14.0)
Platelets: 142 10*3/uL — ABNORMAL LOW (ref 145–400)
RBC: 4.43 10*6/uL (ref 3.70–5.45)
nRBC: 0 % (ref 0–0)

## 2011-11-03 LAB — COMPREHENSIVE METABOLIC PANEL
ALT: 12 U/L (ref 0–35)
CO2: 26 mEq/L (ref 19–32)
Creatinine, Ser: 0.72 mg/dL (ref 0.50–1.10)
Total Bilirubin: 0.4 mg/dL (ref 0.3–1.2)

## 2011-11-03 LAB — UA PROTEIN, DIPSTICK - CHCC: Protein, ur: NEGATIVE mg/dL

## 2011-11-03 MED ORDER — PACLITAXEL CHEMO INJECTION 300 MG/50ML
175.0000 mg/m2 | Freq: Once | INTRAVENOUS | Status: AC
  Administered 2011-11-03: 318 mg via INTRAVENOUS
  Filled 2011-11-03: qty 53

## 2011-11-03 MED ORDER — DEXAMETHASONE SODIUM PHOSPHATE 4 MG/ML IJ SOLN
20.0000 mg | Freq: Once | INTRAMUSCULAR | Status: AC
  Administered 2011-11-03: 20 mg via INTRAVENOUS

## 2011-11-03 MED ORDER — ONDANSETRON 16 MG/50ML IVPB (CHCC)
16.0000 mg | Freq: Once | INTRAVENOUS | Status: AC
  Administered 2011-11-03: 16 mg via INTRAVENOUS

## 2011-11-03 MED ORDER — DIPHENHYDRAMINE HCL 50 MG/ML IJ SOLN
50.0000 mg | Freq: Once | INTRAMUSCULAR | Status: AC
  Administered 2011-11-03: 50 mg via INTRAVENOUS

## 2011-11-03 MED ORDER — FAMOTIDINE IN NACL 20-0.9 MG/50ML-% IV SOLN
20.0000 mg | Freq: Once | INTRAVENOUS | Status: AC
  Administered 2011-11-03: 20 mg via INTRAVENOUS

## 2011-11-03 MED ORDER — SODIUM CHLORIDE 0.9 % IV SOLN
Freq: Once | INTRAVENOUS | Status: AC
  Administered 2011-11-03: 12:00:00 via INTRAVENOUS

## 2011-11-03 MED ORDER — SODIUM CHLORIDE 0.9 % IV SOLN
471.5000 mg | Freq: Once | INTRAVENOUS | Status: AC
  Administered 2011-11-03: 470 mg via INTRAVENOUS
  Filled 2011-11-03: qty 47

## 2011-11-03 MED ORDER — HEPARIN SOD (PORK) LOCK FLUSH 100 UNIT/ML IV SOLN
500.0000 [IU] | Freq: Once | INTRAVENOUS | Status: AC | PRN
  Administered 2011-11-03: 500 [IU]
  Filled 2011-11-03: qty 5

## 2011-11-03 MED ORDER — SODIUM CHLORIDE 0.9 % IV SOLN
15.0000 mg/kg | INTRAVENOUS | Status: DC
  Administered 2011-11-03: 1075 mg via INTRAVENOUS
  Filled 2011-11-03: qty 43

## 2011-11-03 MED ORDER — SODIUM CHLORIDE 0.9 % IJ SOLN
100.0000 ug | Freq: Once | INTRAVENOUS | Status: AC
  Administered 2011-11-03: 0.1 mg via INTRADERMAL
  Filled 2011-11-03: qty 0.01

## 2011-11-03 MED ORDER — SODIUM CHLORIDE 0.9 % IJ SOLN
10.0000 mL | INTRAMUSCULAR | Status: DC | PRN
  Administered 2011-11-03: 10 mL
  Filled 2011-11-03: qty 10

## 2011-11-03 NOTE — Patient Instructions (Addendum)
[facility] Discharge Instructions for Patients Receiving Chemotherapy  Today you received the following chemotherapy agents; Carboplatin, Avastin and Taxol.  To help prevent nausea and vomiting after your treatment, we encourage you to take your nausea medication. Begin taking it as often as prescribed by your physician.    If you develop nausea and vomiting that is not controlled by your nausea medication, call the clinic [phone]. If it is after clinic hours your family physician or the after hours number for the clinic or go to the Emergency Department.   BELOW ARE SYMPTOMS THAT SHOULD BE REPORTED IMMEDIATELY:  *FEVER GREATER THAN 100.5 F  *CHILLS WITH OR WITHOUT FEVER  NAUSEA AND VOMITING THAT IS NOT CONTROLLED WITH YOUR NAUSEA MEDICATION  *UNUSUAL SHORTNESS OF BREATH  *UNUSUAL BRUISING OR BLEEDING  TENDERNESS IN MOUTH AND THROAT WITH OR WITHOUT PRESENCE OF ULCERS  *URINARY PROBLEMS  *BOWEL PROBLEMS  UNUSUAL RASH Items with * indicate a potential emergency and should be followed up as soon as possible.  One of the nurses will contact you 24 hours after your treatment. Please let the nurse know about any problems that you may have experienced. Feel free to call the clinic you have any questions or concerns. The clinic phone number is [phone].   I have been informed and understand all the instructions given to me. I know to contact the clinic, my physician, or go to the Emergency Department if any problems should occur. I do not have any questions at this time, but understand that I may call the clinic during office hours   should I have any questions or need assistance in obtaining follow up care.    __________________________________________  _____________  __________ Signature of Patient or Authorized Representative            Date                   Time    __________________________________________ Nurse's Signature

## 2011-11-03 NOTE — Progress Notes (Signed)
Post Avastin vital signs stable 

## 2011-11-03 NOTE — Telephone Encounter (Signed)
appts made and printed for pt aom °

## 2011-11-03 NOTE — Progress Notes (Signed)
1145 Skin test initiated. 1150 Skin test result unremarkable .No redness or irritation present. 1200 Skin test result unremarkable. No redness or irritation present. 1215 Skin test result unremarkable. No redness or irritation present.

## 2011-11-04 ENCOUNTER — Ambulatory Visit (HOSPITAL_BASED_OUTPATIENT_CLINIC_OR_DEPARTMENT_OTHER): Payer: Medicare Other

## 2011-11-04 VITALS — BP 122/76 | HR 101 | Temp 96.9°F

## 2011-11-04 DIAGNOSIS — C3490 Malignant neoplasm of unspecified part of unspecified bronchus or lung: Secondary | ICD-10-CM

## 2011-11-04 DIAGNOSIS — C3410 Malignant neoplasm of upper lobe, unspecified bronchus or lung: Secondary | ICD-10-CM

## 2011-11-04 MED ORDER — FILGRASTIM 300 MCG/0.5ML IJ SOLN
300.0000 ug | Freq: Once | INTRAMUSCULAR | Status: AC
  Administered 2011-11-04: 300 ug via SUBCUTANEOUS
  Filled 2011-11-04: qty 0.5

## 2011-11-04 NOTE — Patient Instructions (Signed)
Call MD for problems 

## 2011-11-05 ENCOUNTER — Ambulatory Visit (HOSPITAL_BASED_OUTPATIENT_CLINIC_OR_DEPARTMENT_OTHER): Payer: Medicare Other

## 2011-11-05 VITALS — BP 106/74 | HR 108 | Temp 97.7°F

## 2011-11-05 DIAGNOSIS — C3490 Malignant neoplasm of unspecified part of unspecified bronchus or lung: Secondary | ICD-10-CM

## 2011-11-05 DIAGNOSIS — C3410 Malignant neoplasm of upper lobe, unspecified bronchus or lung: Secondary | ICD-10-CM

## 2011-11-05 MED ORDER — FILGRASTIM 300 MCG/0.5ML IJ SOLN
300.0000 ug | Freq: Once | INTRAMUSCULAR | Status: AC
  Administered 2011-11-05: 300 ug via SUBCUTANEOUS
  Filled 2011-11-05: qty 0.5

## 2011-11-05 NOTE — Progress Notes (Signed)
[facility] Telephone:([phone]) [phone]   Fax:(336) [phone]  OFFICE PROGRESS NOTE  [name], MD, MD [address] [facility], [place]. [place] [address]  DIAGNOSIS: Recurrent non-small cell lung cancer initially diagnosed as stage IIIA in (T1b N2 M0) in July 2012.   PRIOR THERAPY:  #1 Status post concurrent chemoradiation with weekly carboplatin and paclitaxel, last dose was given 03/01/2011.  #2 status post 3 seconds of consolidation chemotherapy was carboplatin for AUC of 5 and Alimta 500 mg/M2 giving him to see weeks, last dose was given 05/27/2011. [name]   CURRENT THERAPY: None, but the patient was started this weekly for a cycle of systemic chemotherapy with carboplatin for AUC of 5, paclitaxel 175 mg/M2 and Avastin 15 mg/kg every 3 weeks with Neulasta support. Status post 2 cycles   INTERVAL HISTORY: [name] 78 y.o. female returns to the clinic today for 3 months followup visit accompanied by her husband. She tolerated her first 2 cycles of systemic chemotherapy with carboplatin paclitaxel and Avastin relatively well however did not do well with the Neulasta injections secondary to very intense pain. She reiterates the fact that she would be interested in Neupogen injections this should they be necessary. After her last cycle of chemotherapy she reports that her hands first if she had difficulty walking for approximately 5 days. This is all resolved now. She brings in an article about vitamin B1 or benfotiamine and is interested in taking this on a supplement. She's had some mild nausea that is well-controlled with her current antiemetic. She's currently taking a stool softener for mild constipation. She states she has a dermatology appointment next Thursday and would like to change her lab appointment for Wednesday so she's not so rushed. She voiced no other complaints.   MEDICAL HISTORY: Past Medical History  Diagnosis Date  . Lung mass     . History of radiation therapy 7/31/212-03/08/2011    right upper lobe lung adenocarcinoma  . COPD (chronic obstructive pulmonary disease)   . Hypertension   . Hypercholesterolemia   . Osteoporosis   . History of chemotherapy     carboplatin/paclitaxel  . Cancer     , right upper lobe    ALLERGIES:  is allergic to aleve and zyban.  MEDICATIONS:  Current Outpatient Prescriptions  Medication Sig Dispense Refill  . Vitamin D, Ergocalciferol, (DRISDOL) 50000 UNITS CAPS Take 50,000 Units by mouth every 7 (seven) days.      [name] aspirin 81 MG tablet Take 160 mg by mouth as needed.        . colesevelam (WELCHOL) 625 MG tablet Take 1,875 mg by mouth at bedtime.        . docusate sodium (COLACE) 100 MG capsule Take 100 mg by mouth 2 (two) times daily as needed.        . lidocaine-prilocaine (EMLA) cream       . LORazepam (ATIVAN) 1 MG tablet Taken prior to CT scans      . losartan-hydrochlorothiazide (HYZAAR) 50-12.5 MG per tablet Take 1 tablet by mouth daily.        [name] oxyCODONE-acetaminophen (PERCOCET) 5-325 MG per tablet       . traMADol (ULTRAM) 50 MG tablet Take 1 tablet (50 mg total) by mouth every 6 (six) hours as needed. Maximum dose= 8 tablets per day  60 tablet  0    SURGICAL HISTORY:  Past Surgical History  Procedure Date  . Abdominal hysterectomy  s/p for fibroid tumor  . Portacath placement     right ij     REVIEW OF SYSTEMS:  Pertinent items are noted in HPI.   PHYSICAL EXAMINATION: General appearance: alert, cooperative and no distress Neck: no adenopathy Lymph nodes: Cervical, supraclavicular, and axillary nodes normal. Resp: clear to auscultation bilaterally Cardio: regular rate and rhythm, S1, S2 normal, no murmur, click, rub or gallop GI: soft, non-tender; bowel sounds normal; no masses,  no organomegaly Extremities: extremities normal, atraumatic, no cyanosis or edema Neurologic: Alert and oriented X 3, normal strength and tone. Normal symmetric reflexes.  Normal coordination and gait  ECOG PERFORMANCE STATUS: 0 - Asymptomatic  Blood pressure 139/83, pulse 106, temperature 96.7 F (35.9 C), temperature source Oral, height 5\' 4"  (1.626 m), weight 154 lb (69.854 kg).  LABORATORY DATA: Lab Results  Component Value Date   WBC 5.4 11/03/2011   HGB 13.1 11/03/2011   HCT 38.5 11/03/2011   MCV 86.9 11/03/2011   PLT 142* 11/03/2011      Chemistry      Component Value Date/Time   NA 139 11/03/2011 0916   NA 142 09/16/2011 0917   K 3.7 11/03/2011 0916   K 3.7 09/16/2011 0917   CL 103 11/03/2011 0916   CL 106 09/16/2011 0917   CO2 26 11/03/2011 0916   CO2 29 09/16/2011 0917   BUN 15 11/03/2011 0916   BUN 16 09/16/2011 0917   CREATININE 0.72 11/03/2011 0916   CREATININE 0.8 09/16/2011 0917      Component Value Date/Time   CALCIUM 9.3 11/03/2011 0916   CALCIUM 8.3 09/16/2011 0917   ALKPHOS 64 11/03/2011 0916   ALKPHOS 62 09/16/2011 0917   AST 15 11/03/2011 0916   AST 18 09/16/2011 0917   ALT 12 11/03/2011 0916   BILITOT 0.4 11/03/2011 0916   BILITOT 0.60 09/16/2011 0917       RADIOGRAPHIC STUDIES: Ct Chest W Contrast  09/16/2011  *RADIOLOGY REPORT*  Clinical Data: Follow-up lung carcinoma.  Previous radiation therapy and chemotherapy.  CT CHEST WITH CONTRAST  Technique:  Multidetector CT imaging of the chest was performed following the standard protocol during bolus administration of intravenous contrast.  Contrast:  80 ml Omnipaque-300  Comparison: 06/10/2011  Findings: A small less than 1 cm pulmonary nodules are seen bilaterally throughout both lungs which show increased in size and number since previous exam.  These are consistent with pulmonary metastases.  Paramediastinal radiation changes are noted in the medial right lung.  There is a new focal area of airspace opacity in the superior and medial right lower lobe.  This could represent worsening radiation pneumonitis versus pneumonia.  A small right pleural effusion is increased in size.  There is no  evidence of centrally obstructing hilar mass or endobronchial lesion.  No hilar or mediastinal lymphadenopathy identified.  No adenopathy seen elsewhere within the thorax.  No suspicious bone lesions are identified.   Adrenal glands remain normal in appearance.  Incidental note is made of a tiny calcified gallstone.  IMPRESSION:  1.  Interval progression of diffuse bilateral pulmonary metastases. 2.  New focal airspace disease in the superior and medial right lower lobe.  Differential diagnosis includes acute radiation pneumonitis and pneumonia. 3.  No evidence of lymphadenopathy. 4.  New tiny right pleural effusion. 5.  Cholelithiasis incidentally noted.  Original Report Authenticated By: Danae Orleans, M.D.    ASSESSMENT/PLAN: This is a very pleasant 78 years old white female with recurrent non-small cell lung cancer  presented with bilateral pulmonary nodules on the last CT scan of the chest. The patient was discussed with Dr. Arbutus Ped. She'll proceed with her third cycle of systemic chemotherapy with carboplatin, paclitaxel and Avastin today as scheduled. She will be placed on 5 days of Neupogen at 300 mcg subcutaneously given on a daily basis beginning 11/04/2011. She'll followup with Dr. Gwenyth Bouillon in 3 weeks with repeat CBC differential C. met urine protein dipstick and a CT of the chest abdomen and pelvis with contrast to reevaluate her disease. Is given a prescription for Ativan 1 mg tablet to be taken 15 minutes prior to her CT scan for anxiety related to this procedure. She may take a B complex supplement to include vitamin B1.   Catalia Massett E, PA-C   All questions were answered. The patient knows to call the clinic with any problems, questions or concerns. We can certainly see the patient much sooner if necessary.

## 2011-11-06 ENCOUNTER — Ambulatory Visit (HOSPITAL_BASED_OUTPATIENT_CLINIC_OR_DEPARTMENT_OTHER): Payer: Medicare Other

## 2011-11-06 VITALS — BP 113/77 | HR 109 | Temp 97.4°F

## 2011-11-06 DIAGNOSIS — C3490 Malignant neoplasm of unspecified part of unspecified bronchus or lung: Secondary | ICD-10-CM

## 2011-11-06 DIAGNOSIS — C3410 Malignant neoplasm of upper lobe, unspecified bronchus or lung: Secondary | ICD-10-CM

## 2011-11-06 MED ORDER — FILGRASTIM 300 MCG/0.5ML IJ SOLN
300.0000 ug | Freq: Once | INTRAMUSCULAR | Status: AC
  Administered 2011-11-06: 300 ug via SUBCUTANEOUS

## 2011-11-08 ENCOUNTER — Ambulatory Visit (HOSPITAL_BASED_OUTPATIENT_CLINIC_OR_DEPARTMENT_OTHER): Payer: Medicare Other

## 2011-11-08 ENCOUNTER — Ambulatory Visit: Payer: Medicare Other | Admitting: Internal Medicine

## 2011-11-08 VITALS — BP 98/68 | HR 111 | Temp 97.1°F

## 2011-11-08 DIAGNOSIS — C3490 Malignant neoplasm of unspecified part of unspecified bronchus or lung: Secondary | ICD-10-CM

## 2011-11-08 DIAGNOSIS — C3410 Malignant neoplasm of upper lobe, unspecified bronchus or lung: Secondary | ICD-10-CM

## 2011-11-08 MED ORDER — FILGRASTIM 300 MCG/0.5ML IJ SOLN
300.0000 ug | Freq: Once | INTRAMUSCULAR | Status: AC
  Administered 2011-11-08: 300 ug via SUBCUTANEOUS

## 2011-11-09 ENCOUNTER — Ambulatory Visit (HOSPITAL_BASED_OUTPATIENT_CLINIC_OR_DEPARTMENT_OTHER): Payer: Medicare Other

## 2011-11-09 VITALS — BP 118/65 | HR 110 | Temp 97.8°F

## 2011-11-09 DIAGNOSIS — C3490 Malignant neoplasm of unspecified part of unspecified bronchus or lung: Secondary | ICD-10-CM

## 2011-11-09 DIAGNOSIS — C3410 Malignant neoplasm of upper lobe, unspecified bronchus or lung: Secondary | ICD-10-CM

## 2011-11-09 MED ORDER — FILGRASTIM 300 MCG/0.5ML IJ SOLN
300.0000 ug | Freq: Once | INTRAMUSCULAR | Status: AC
  Administered 2011-11-09: 300 ug via SUBCUTANEOUS
  Filled 2011-11-09: qty 0.5

## 2011-11-10 ENCOUNTER — Other Ambulatory Visit (HOSPITAL_BASED_OUTPATIENT_CLINIC_OR_DEPARTMENT_OTHER): Payer: Medicare Other | Admitting: Lab

## 2011-11-10 DIAGNOSIS — C3490 Malignant neoplasm of unspecified part of unspecified bronchus or lung: Secondary | ICD-10-CM

## 2011-11-10 LAB — COMPREHENSIVE METABOLIC PANEL
Albumin: 3.6 g/dL (ref 3.5–5.2)
Alkaline Phosphatase: 91 U/L (ref 39–117)
BUN: 21 mg/dL (ref 6–23)
CO2: 29 mEq/L (ref 19–32)
Calcium: 9.5 mg/dL (ref 8.4–10.5)
Glucose, Bld: 137 mg/dL — ABNORMAL HIGH (ref 70–99)
Potassium: 3.5 mEq/L (ref 3.5–5.3)

## 2011-11-10 LAB — CBC WITH DIFFERENTIAL/PLATELET
Basophils Absolute: 0 10*3/uL (ref 0.0–0.1)
EOS%: 4.4 % (ref 0.0–7.0)
HCT: 35.6 % (ref 34.8–46.6)
HGB: 11.9 g/dL (ref 11.6–15.9)
MCH: 29.9 pg (ref 25.1–34.0)
MCV: 89.3 fL (ref 79.5–101.0)
NEUT%: 74.4 % (ref 38.4–76.8)
lymph#: 0.4 10*3/uL — ABNORMAL LOW (ref 0.9–3.3)

## 2011-11-11 ENCOUNTER — Other Ambulatory Visit: Payer: Medicare Other

## 2011-11-18 ENCOUNTER — Other Ambulatory Visit (HOSPITAL_BASED_OUTPATIENT_CLINIC_OR_DEPARTMENT_OTHER): Payer: Medicare Other | Admitting: Lab

## 2011-11-18 DIAGNOSIS — C3490 Malignant neoplasm of unspecified part of unspecified bronchus or lung: Secondary | ICD-10-CM

## 2011-11-18 LAB — CBC WITH DIFFERENTIAL/PLATELET
Eosinophils Absolute: 0.1 10*3/uL (ref 0.0–0.5)
MONO#: 0.3 10*3/uL (ref 0.1–0.9)
NEUT#: 2.4 10*3/uL (ref 1.5–6.5)
Platelets: 160 10*3/uL (ref 145–400)
RBC: 3.82 10*6/uL (ref 3.70–5.45)
RDW: 16.9 % — ABNORMAL HIGH (ref 11.2–14.5)
WBC: 3.2 10*3/uL — ABNORMAL LOW (ref 3.9–10.3)
lymph#: 0.3 10*3/uL — ABNORMAL LOW (ref 0.9–3.3)
nRBC: 0 % (ref 0–0)

## 2011-11-18 LAB — COMPREHENSIVE METABOLIC PANEL
ALT: 12 U/L (ref 0–35)
AST: 12 U/L (ref 0–37)
CO2: 26 mEq/L (ref 19–32)
Calcium: 8.9 mg/dL (ref 8.4–10.5)
Chloride: 107 mEq/L (ref 96–112)
Sodium: 142 mEq/L (ref 135–145)
Total Protein: 6 g/dL (ref 6.0–8.3)

## 2011-11-18 LAB — UA PROTEIN, DIPSTICK - CHCC: Protein, ur: <30 mg/dL

## 2011-11-19 ENCOUNTER — Encounter (HOSPITAL_COMMUNITY): Payer: Self-pay

## 2011-11-19 ENCOUNTER — Ambulatory Visit (HOSPITAL_COMMUNITY)
Admission: RE | Admit: 2011-11-19 | Discharge: 2011-11-19 | Disposition: A | Discharge status: Home / Self Care | Payer: Medicare Other | Source: Ambulatory Visit | Attending: Physician Assistant | Admitting: Physician Assistant

## 2011-11-19 DIAGNOSIS — Z923 Personal history of irradiation: Secondary | ICD-10-CM | POA: Insufficient documentation

## 2011-11-19 DIAGNOSIS — C3490 Malignant neoplasm of unspecified part of unspecified bronchus or lung: Secondary | ICD-10-CM | POA: Insufficient documentation

## 2011-11-19 MED ORDER — IOHEXOL 300 MG/ML  SOLN
100.0000 mL | Freq: Once | INTRAMUSCULAR | Status: AC | PRN
  Administered 2011-11-19: 100 mL via INTRAVENOUS

## 2011-11-24 ENCOUNTER — Other Ambulatory Visit (HOSPITAL_BASED_OUTPATIENT_CLINIC_OR_DEPARTMENT_OTHER): Payer: Medicare Other | Admitting: Lab

## 2011-11-24 ENCOUNTER — Telehealth: Payer: Self-pay | Admitting: Internal Medicine

## 2011-11-24 ENCOUNTER — Ambulatory Visit (HOSPITAL_BASED_OUTPATIENT_CLINIC_OR_DEPARTMENT_OTHER): Payer: Medicare Other

## 2011-11-24 ENCOUNTER — Ambulatory Visit (HOSPITAL_BASED_OUTPATIENT_CLINIC_OR_DEPARTMENT_OTHER): Payer: Medicare Other | Admitting: Internal Medicine

## 2011-11-24 VITALS — BP 139/85 | HR 106 | Temp 96.7°F | Ht 64.0 in | Wt 152.4 lb

## 2011-11-24 DIAGNOSIS — C3410 Malignant neoplasm of upper lobe, unspecified bronchus or lung: Secondary | ICD-10-CM

## 2011-11-24 DIAGNOSIS — K7689 Other specified diseases of liver: Secondary | ICD-10-CM

## 2011-11-24 DIAGNOSIS — C3490 Malignant neoplasm of unspecified part of unspecified bronchus or lung: Secondary | ICD-10-CM

## 2011-11-24 DIAGNOSIS — Z5112 Encounter for antineoplastic immunotherapy: Secondary | ICD-10-CM

## 2011-11-24 DIAGNOSIS — Z5111 Encounter for antineoplastic chemotherapy: Secondary | ICD-10-CM

## 2011-11-24 LAB — CBC WITH DIFFERENTIAL/PLATELET
Basophils Absolute: 0.1 10*3/uL (ref 0.0–0.1)
EOS%: 1.5 % (ref 0.0–7.0)
Eosinophils Absolute: 0.1 10*3/uL (ref 0.0–0.5)
HGB: 12.1 g/dL (ref 11.6–15.9)
LYMPH%: 10.6 % — ABNORMAL LOW (ref 14.0–49.7)
MCH: 30.2 pg (ref 25.1–34.0)
MCV: 89 fL (ref 79.5–101.0)
MONO%: 20.9 % — ABNORMAL HIGH (ref 0.0–14.0)
NEUT#: 2.7 10*3/uL (ref 1.5–6.5)
NEUT%: 65.3 % (ref 38.4–76.8)
Platelets: 199 10*3/uL (ref 145–400)
RDW: 17 % — ABNORMAL HIGH (ref 11.2–14.5)

## 2011-11-24 LAB — COMPREHENSIVE METABOLIC PANEL
Alkaline Phosphatase: 63 U/L (ref 39–117)
BUN: 12 mg/dL (ref 6–23)
CO2: 25 mEq/L (ref 19–32)
Creatinine, Ser: 0.74 mg/dL (ref 0.50–1.10)
Glucose, Bld: 84 mg/dL (ref 70–99)
Sodium: 141 mEq/L (ref 135–145)
Total Bilirubin: 0.5 mg/dL (ref 0.3–1.2)
Total Protein: 6.2 g/dL (ref 6.0–8.3)

## 2011-11-24 LAB — UA PROTEIN, DIPSTICK - CHCC: Protein, ur: NEGATIVE mg/dL

## 2011-11-24 MED ORDER — SODIUM CHLORIDE 0.9 % IV SOLN
490.5000 mg | Freq: Once | INTRAVENOUS | Status: AC
  Administered 2011-11-24: 490 mg via INTRAVENOUS
  Filled 2011-11-24: qty 49

## 2011-11-24 MED ORDER — SODIUM CHLORIDE 0.9 % IV SOLN
Freq: Once | INTRAVENOUS | Status: AC
  Administered 2011-11-24: 13:00:00 via INTRAVENOUS

## 2011-11-24 MED ORDER — SODIUM CHLORIDE 0.9 % IV SOLN
15.0000 mg/kg | INTRAVENOUS | Status: DC
  Administered 2011-11-24: 1075 mg via INTRAVENOUS
  Filled 2011-11-24: qty 43

## 2011-11-24 MED ORDER — ONDANSETRON 16 MG/50ML IVPB (CHCC)
16.0000 mg | Freq: Once | INTRAVENOUS | Status: AC
  Administered 2011-11-24: 16 mg via INTRAVENOUS

## 2011-11-24 MED ORDER — HEPARIN SOD (PORK) LOCK FLUSH 100 UNIT/ML IV SOLN
500.0000 [IU] | Freq: Once | INTRAVENOUS | Status: DC | PRN
  Filled 2011-11-24: qty 5

## 2011-11-24 MED ORDER — SODIUM CHLORIDE 0.9 % IJ SOLN
100.0000 ug | Freq: Once | INTRAVENOUS | Status: AC
  Administered 2011-11-24: 0.1 mg via INTRADERMAL
  Filled 2011-11-24: qty 0.01

## 2011-11-24 MED ORDER — FAMOTIDINE IN NACL 20-0.9 MG/50ML-% IV SOLN
20.0000 mg | Freq: Once | INTRAVENOUS | Status: AC
  Administered 2011-11-24: 20 mg via INTRAVENOUS

## 2011-11-24 MED ORDER — DIPHENHYDRAMINE HCL 50 MG/ML IJ SOLN
50.0000 mg | Freq: Once | INTRAMUSCULAR | Status: AC
  Administered 2011-11-24: 50 mg via INTRAVENOUS

## 2011-11-24 MED ORDER — PACLITAXEL CHEMO INJECTION 300 MG/50ML
175.0000 mg/m2 | Freq: Once | INTRAVENOUS | Status: AC
  Administered 2011-11-24: 318 mg via INTRAVENOUS
  Filled 2011-11-24: qty 53

## 2011-11-24 MED ORDER — SODIUM CHLORIDE 0.9 % IJ SOLN
10.0000 mL | INTRAMUSCULAR | Status: DC | PRN
  Filled 2011-11-24: qty 10

## 2011-11-24 MED ORDER — DEXAMETHASONE SODIUM PHOSPHATE 4 MG/ML IJ SOLN
20.0000 mg | Freq: Once | INTRAMUSCULAR | Status: AC
  Administered 2011-11-24: 20 mg via INTRAVENOUS

## 2011-11-24 NOTE — Telephone Encounter (Signed)
Gv pt appt for june and july2013 

## 2011-11-24 NOTE — Progress Notes (Signed)
1230 Carboplatin skin test initiated. 1235 Skin test unremarkable. 1245 Skin test unremarkable. 1300 Skin test unremarkable.

## 2011-11-24 NOTE — Progress Notes (Signed)
[facility] [phone]   Fax:([phone]  OFFICE PROGRESS NOTE  [name], MD, MD [address] [facility], [place]. [place] [address]  DIAGNOSIS: Recurrent non-small cell lung cancer initially diagnosed as stage IIIA in (T1b N2 M0) in July 2012.   PRIOR THERAPY:  #1 Status post concurrent chemoradiation with weekly carboplatin and paclitaxel, last dose was given 03/01/2011.  #2 status post 3 seconds of consolidation chemotherapy was carboplatin for AUC of 5 and Alimta 500 mg/M2 giving him to see weeks, last dose was given 05/27/2011. [name]   CURRENT THERAPY: None, but the patient was started this weekly for a cycle of systemic chemotherapy with carboplatin for AUC of 5, paclitaxel 175 mg/M2 and Avastin 15 mg/kg every 3 weeks. Status post 3 cycles   INTERVAL HISTORY: [name] 78 y.o. female returns to the clinic today for followup visit accompanied by her husband. The patient tolerated the last cycle of her chemotherapy fairly well. She did much better without the Neulasta injection but she received prophylactic Neupogen for 5 days after her chemotherapy. She denied having any significant chest pain or shortness of breath. She had mild nausea last night but not after the chemotherapy. She has no significant weight loss or night sweats. No significant peripheral neuropathy. The patient has repeat CT scan of the chest, abdomen and pelvis performed recently and she is here today for evaluation and discussion of her scan results.  MEDICAL HISTORY: Past Medical History  Diagnosis Date  . Lung mass   . History of radiation therapy 7/31/212-03/08/2011    right upper lobe lung adenocarcinoma  . COPD (chronic obstructive pulmonary disease)   . Hypertension   . Hypercholesterolemia   . Osteoporosis   . History of chemotherapy     carboplatin/paclitaxel  . Cancer     , right upper lobe    ALLERGIES:  is allergic to aleve and  zyban.  MEDICATIONS:  Current Outpatient Prescriptions  Medication Sig Dispense Refill  . aspirin 81 MG tablet Take 160 mg by mouth as needed.        . colesevelam (WELCHOL) 625 MG tablet Take 1,875 mg by mouth at bedtime.        . docusate sodium (COLACE) 100 MG capsule Take 100 mg by mouth 2 (two) times daily as needed.        . lidocaine-prilocaine (EMLA) cream       . LORazepam (ATIVAN) 1 MG tablet Taken prior to CT scans      . losartan-hydrochlorothiazide (HYZAAR) 50-12.5 MG per tablet Take 1 tablet by mouth daily.        [name] oxyCODONE-acetaminophen (PERCOCET) 5-325 MG per tablet Take by mouth as needed.       . Thiamine HCl (VITAMIN B-1 PO) Take by mouth daily.      . traMADol (ULTRAM) 50 MG tablet Take 1 tablet (50 mg total) by mouth every 6 (six) hours as needed. Maximum dose= 8 tablets per day  60 tablet  0  . Vitamin D, Ergocalciferol, (DRISDOL) 50000 UNITS CAPS Take 50,000 Units by mouth every 7 (seven) days.        SURGICAL HISTORY:  Past Surgical History  Procedure Date  . Abdominal hysterectomy     s/p for fibroid tumor  . Portacath placement     right ij     REVIEW OF SYSTEMS:  A comprehensive review of systems was negative.   PHYSICAL EXAMINATION: General appearance: alert,  cooperative and no distress Head: Normocephalic, without obvious abnormality, atraumatic Neck: no adenopathy Lymph nodes: Cervical, supraclavicular, and axillary nodes normal. Resp: clear to auscultation bilaterally Cardio: regular rate and rhythm, S1, S2 normal, no murmur, click, rub or gallop GI: soft, non-tender; bowel sounds normal; no masses,  no organomegaly Extremities: extremities normal, atraumatic, no cyanosis or edema Neurologic: Alert and oriented X 3, normal strength and tone. Normal symmetric reflexes. Normal coordination and gait  ECOG PERFORMANCE STATUS: 1 - Symptomatic but completely ambulatory  Blood pressure 139/85, pulse 106, temperature 96.7 F (35.9 C), temperature  source Oral, height 5\' 4"  (1.626 m), weight 152 lb 6.4 oz (69.128 kg).  LABORATORY DATA: Lab Results  Component Value Date   WBC 4.1 11/24/2011   HGB 12.1 11/24/2011   HCT 35.7 11/24/2011   MCV 89.0 11/24/2011   PLT 199 11/24/2011      Chemistry      Component Value Date/Time   NA 142 11/18/2011 1022   NA 142 09/16/2011 0917   K 3.5 11/18/2011 1022   K 3.7 09/16/2011 0917   CL 107 11/18/2011 1022   CL 106 09/16/2011 0917   CO2 26 11/18/2011 1022   CO2 29 09/16/2011 0917   BUN 14 11/18/2011 1022   BUN 16 09/16/2011 0917   CREATININE 0.73 11/18/2011 1022   CREATININE 0.8 09/16/2011 0917      Component Value Date/Time   CALCIUM 8.9 11/18/2011 1022   CALCIUM 8.3 09/16/2011 0917   ALKPHOS 65 11/18/2011 1022   ALKPHOS 62 09/16/2011 0917   AST 12 11/18/2011 1022   AST 18 09/16/2011 0917   ALT 12 11/18/2011 1022   BILITOT 0.3 11/18/2011 1022   BILITOT 0.60 09/16/2011 0917       RADIOGRAPHIC STUDIES: Ct Chest W Contrast  11/19/2011  *RADIOLOGY REPORT*  Clinical Data:  Chemotherapy in progress.  Radiation therapy complete.  Lung cancer diagnosed in July 2012.  CT CHEST, ABDOMEN AND PELVIS WITH CONTRAST  Technique:  Multidetector CT imaging of the chest, abdomen and pelvis was performed following the standard protocol during bolus administration of intravenous contrast.  Contrast:  100 ml Omnipaque 300  Comparison:  CT 03/28 1013  CT CHEST  Findings:  There is a port in the right anterior chest wall.  No axillary or supraclavicular lymphadenopathy.  No mediastinal or hilar lymphadenopathy.  No pericardial fluid.  Coronary calcifications are present.  Esophagus is normal.  There is linear fibrosis in the paramediastinal right upper lobe consistent radiation injury.  Bilateral small pulmonary nodules are again demonstrated.  There these are not increased in size compared to prior.  For example right lower lobe nodule measures 4 mm (image 41) compared to 5 mm on prior.  Pleural based nodule in the right lower lobe  measures 4 mm (image 39) not changed from 5 mm on prior. 4 mm nodule in the medial left lung base (image 36) decreased from 5 mm on prior.  Focal consolidation in the right lower lobe (image 26)  is slightly less dense than prior.  No new pulmonary nodules are present.  IMPRESSION:  1.  Interval decrease in size of multiple small pulmonary metastasis.  No new metastatic lesions identified. 2.  Stable focus of consolidation in the right lower lobe.  3.  Stable paramediastinal radiation change in the right upper lobe.  CT ABDOMEN AND PELVIS  Findings:  Small ill-defined hypodense lesion in the superior aspect of the left lateral hepatic lobe measures 5 mm.  This  is faintly visible on comparison exam of 09/16/2011 .    Gallstones in the gallbladder.  The pancreas, spleen, adrenal glands, and kidneys are unchanged.  The stomach, small bowel, and colon are normal. There are diverticula sigmoid colon with nondistension.  No free fluid the pelvis.  No retroperitoneal or pelvic lymphadenopathy.  No peritoneal disease.  There is a fem-fem bypass graft.  Bladder is normal.  Post hysterectomy anatomy.  Round sclerotic lesion in the in the inferior endplate of the T9 vertebral body which measures 9 mm (image 45).  This is more prominent on the comparison exams of 09/16/2011 not present on the comparison CT of 01/13/2011.  IMPRESSION:  1.  No evidence of metastasis to the abdomen pelvis. 2.  Small Hypodense lesion in the left hepatic lobe.  Recommend attention on follow-up.  3.  Enlarging round sclerotic lesion in T9 vertebral body endplate.  This likely represents an enlarging Schmorl's node. Recommend attention on follow-up to exclude an unlikely solitary skeletal metastasis.  Original Report Authenticated By: Genevive Bi, M.D.   Ct Abdomen Pelvis W Contrast  11/19/2011  *RADIOLOGY REPORT*  Clinical Data:  Chemotherapy in progress.  Radiation therapy complete.  Lung cancer diagnosed in July 2012.  CT CHEST, ABDOMEN AND  PELVIS WITH CONTRAST  Technique:  Multidetector CT imaging of the chest, abdomen and pelvis was performed following the standard protocol during bolus administration of intravenous contrast.  Contrast:  100 ml Omnipaque 300  Comparison:  CT 03/28 1013   CT CHEST  Findings:  There is a port in the right anterior chest wall.  No axillary or supraclavicular lymphadenopathy.  No mediastinal or hilar lymphadenopathy.  No pericardial fluid.  Coronary calcifications are present.  Esophagus is normal.  There is linear fibrosis in the paramediastinal right upper lobe consistent radiation injury.  Bilateral small pulmonary nodules are again demonstrated.  There these are not increased in size compared to prior.  For example right lower lobe nodule measures 4 mm (image 41) compared to 5 mm on prior.  Pleural based nodule in the right lower lobe measures 4 mm (image 39) not changed from 5 mm on prior. 4 mm nodule in the medial left lung base (image 36) decreased from 5 mm on prior.  Focal consolidation in the right lower lobe (image 26)  is slightly less dense than prior.  No new pulmonary nodules are present.  IMPRESSION:  1.  Interval decrease in size of multiple small pulmonary metastasis.  No new metastatic lesions identified. 2.  Stable focus of consolidation in the right lower lobe.  3.  Stable paramediastinal radiation change in the right upper lobe.   CT ABDOMEN AND PELVIS  Findings:  Small ill-defined hypodense lesion in the superior aspect of the left lateral hepatic lobe measures 5 mm.  This is faintly visible on comparison exam of 09/16/2011 .    Gallstones in the gallbladder.  The pancreas, spleen, adrenal glands, and kidneys are unchanged.  The stomach, small bowel, and colon are normal. There are diverticula sigmoid colon with nondistension.  No free fluid the pelvis.  No retroperitoneal or pelvic lymphadenopathy.  No peritoneal disease.  There is a fem-fem bypass graft.  Bladder is normal.  Post hysterectomy  anatomy.  Round sclerotic lesion in the in the inferior endplate of the T9 vertebral body which measures 9 mm (image 45).  This is more prominent on the comparison exams of 09/16/2011 not present on the comparison CT of 01/13/2011.  IMPRESSION:  1.  No evidence of metastasis to the abdomen pelvis. 2.  Small Hypodense lesion in the left hepatic lobe.  Recommend attention on follow-up.  3.  Enlarging round sclerotic lesion in T9 vertebral body endplate.  This likely represents an enlarging Schmorl's node. Recommend attention on follow-up to exclude an unlikely solitary skeletal metastasis.  Original Report Authenticated By: Genevive Bi, M.D.    ASSESSMENT: This is a very pleasant 78 years old white female with recurrent non-small cell lung cancer currently on systemic chemotherapy with carboplatin, paclitaxel and Avastin status post 3 cycles. The patient is tolerating her treatment fairly well and she has evidence for partial response.  PLAN: I discussed the scan results with the patient and her husband. I recommended for her to continue treatment with the same regimen for now. She will receive 5 days of prophylactic Neupogen after her systemic chemotherapy. The patient would come back for followup visit in 3 weeks with the start of cycle #5. She was advised to call immediately if she has any concerning symptoms in the interval.  All questions were answered. The patient knows to call the clinic with any problems, questions or concerns. We can certainly see the patient much sooner if necessary.  I spent 15 minutes counseling the patient face to face. The total time spent in the appointment was 25 minutes.

## 2011-11-24 NOTE — Patient Instructions (Signed)
[facility] Discharge Instructions for Patients Receiving Chemotherapy  Today you received the following chemotherapy agents Taxol/Carbo/Avastin To help prevent nausea and vomiting after your treatment, we encourage you to take your nausea medication as directed by your provider.   If you develop nausea and vomiting that is not controlled by your nausea medication, call the clinic. If it is after clinic hours your family physician or the after hours number for the clinic or go to the Emergency Department.   BELOW ARE SYMPTOMS THAT SHOULD BE REPORTED IMMEDIATELY:  *FEVER GREATER THAN 100.5 F  *CHILLS WITH OR WITHOUT FEVER  NAUSEA AND VOMITING THAT IS NOT CONTROLLED WITH YOUR NAUSEA MEDICATION  *UNUSUAL SHORTNESS OF BREATH  *UNUSUAL BRUISING OR BLEEDING  TENDERNESS IN MOUTH AND THROAT WITH OR WITHOUT PRESENCE OF ULCERS  *URINARY PROBLEMS  *BOWEL PROBLEMS  UNUSUAL RASH Items with * indicate a potential emergency and should be followed up as soon as possible.  One of the nurses will contact you 24 hours after your treatment. Please let the nurse know about any problems that you may have experienced. Feel free to call the clinic you have any questions or concerns. The clinic phone number is [phone].   I have been informed and understand all the instructions given to me. I know to contact the clinic, my physician, or go to the Emergency Department if any problems should occur. I do not have any questions at this time, but understand that I may call the clinic during office hours   should I have any questions or need assistance in obtaining follow up care.    __________________________________________  _____________  __________ Signature of Patient or Authorized Representative            Date                   Time    __________________________________________ Nurse's Signature

## 2011-11-25 ENCOUNTER — Ambulatory Visit (HOSPITAL_BASED_OUTPATIENT_CLINIC_OR_DEPARTMENT_OTHER): Payer: Medicare Other

## 2011-11-25 VITALS — BP 151/85 | HR 104 | Temp 96.8°F

## 2011-11-25 DIAGNOSIS — C3490 Malignant neoplasm of unspecified part of unspecified bronchus or lung: Secondary | ICD-10-CM

## 2011-11-25 DIAGNOSIS — C3410 Malignant neoplasm of upper lobe, unspecified bronchus or lung: Secondary | ICD-10-CM

## 2011-11-25 DIAGNOSIS — Z5189 Encounter for other specified aftercare: Secondary | ICD-10-CM

## 2011-11-25 MED ORDER — FILGRASTIM 300 MCG/0.5ML IJ SOLN
300.0000 ug | Freq: Once | INTRAMUSCULAR | Status: AC
  Administered 2011-11-25: 300 ug via SUBCUTANEOUS
  Filled 2011-11-25: qty 0.5

## 2011-11-26 ENCOUNTER — Ambulatory Visit (HOSPITAL_BASED_OUTPATIENT_CLINIC_OR_DEPARTMENT_OTHER): Payer: Medicare Other

## 2011-11-26 VITALS — BP 116/66 | HR 61 | Temp 97.1°F

## 2011-11-26 DIAGNOSIS — C3410 Malignant neoplasm of upper lobe, unspecified bronchus or lung: Secondary | ICD-10-CM

## 2011-11-26 DIAGNOSIS — C3490 Malignant neoplasm of unspecified part of unspecified bronchus or lung: Secondary | ICD-10-CM

## 2011-11-26 MED ORDER — FILGRASTIM 300 MCG/0.5ML IJ SOLN
300.0000 ug | Freq: Once | INTRAMUSCULAR | Status: AC
  Administered 2011-11-26: 300 ug via SUBCUTANEOUS

## 2011-11-27 ENCOUNTER — Ambulatory Visit (HOSPITAL_BASED_OUTPATIENT_CLINIC_OR_DEPARTMENT_OTHER): Payer: Medicare Other

## 2011-11-27 VITALS — BP 126/81 | HR 97 | Temp 97.8°F

## 2011-11-27 DIAGNOSIS — C3490 Malignant neoplasm of unspecified part of unspecified bronchus or lung: Secondary | ICD-10-CM

## 2011-11-27 MED ORDER — FILGRASTIM 300 MCG/0.5ML IJ SOLN
300.0000 ug | Freq: Once | INTRAMUSCULAR | Status: AC
  Administered 2011-11-27: 300 ug via SUBCUTANEOUS

## 2011-11-29 ENCOUNTER — Ambulatory Visit (HOSPITAL_BASED_OUTPATIENT_CLINIC_OR_DEPARTMENT_OTHER): Payer: Medicare Other

## 2011-11-29 VITALS — BP 97/67 | HR 116 | Temp 97.1°F

## 2011-11-29 DIAGNOSIS — IMO0002 Reserved for concepts with insufficient information to code with codable children: Secondary | ICD-10-CM

## 2011-11-29 DIAGNOSIS — C3410 Malignant neoplasm of upper lobe, unspecified bronchus or lung: Secondary | ICD-10-CM

## 2011-11-29 DIAGNOSIS — C3490 Malignant neoplasm of unspecified part of unspecified bronchus or lung: Secondary | ICD-10-CM

## 2011-11-29 MED ORDER — FILGRASTIM 300 MCG/0.5ML IJ SOLN
300.0000 ug | Freq: Once | INTRAMUSCULAR | Status: AC
  Administered 2011-11-29: 300 ug via SUBCUTANEOUS
  Filled 2011-11-29: qty 0.5

## 2011-11-30 ENCOUNTER — Ambulatory Visit (HOSPITAL_BASED_OUTPATIENT_CLINIC_OR_DEPARTMENT_OTHER): Payer: Medicare Other

## 2011-11-30 VITALS — BP 104/69 | HR 112 | Temp 97.1°F

## 2011-11-30 DIAGNOSIS — C3410 Malignant neoplasm of upper lobe, unspecified bronchus or lung: Secondary | ICD-10-CM

## 2011-11-30 DIAGNOSIS — C3490 Malignant neoplasm of unspecified part of unspecified bronchus or lung: Secondary | ICD-10-CM

## 2011-11-30 MED ORDER — FILGRASTIM 300 MCG/0.5ML IJ SOLN
300.0000 ug | Freq: Once | INTRAMUSCULAR | Status: AC
  Administered 2011-11-30: 300 ug via SUBCUTANEOUS
  Filled 2011-11-30: qty 0.5

## 2011-12-02 ENCOUNTER — Other Ambulatory Visit (HOSPITAL_BASED_OUTPATIENT_CLINIC_OR_DEPARTMENT_OTHER): Payer: Medicare Other | Admitting: Lab

## 2011-12-02 DIAGNOSIS — C3410 Malignant neoplasm of upper lobe, unspecified bronchus or lung: Secondary | ICD-10-CM

## 2011-12-02 DIAGNOSIS — C3490 Malignant neoplasm of unspecified part of unspecified bronchus or lung: Secondary | ICD-10-CM

## 2011-12-02 LAB — CBC WITH DIFFERENTIAL/PLATELET
BASO%: 0.8 % (ref 0.0–2.0)
Eosinophils Absolute: 0.1 10*3/uL (ref 0.0–0.5)
HCT: 34.9 % (ref 34.8–46.6)
LYMPH%: 7.8 % — ABNORMAL LOW (ref 14.0–49.7)
MCHC: 33.5 g/dL (ref 31.5–36.0)
MONO#: 0.8 10*3/uL (ref 0.1–0.9)
NEUT#: 4.1 10*3/uL (ref 1.5–6.5)
NEUT%: 74.7 % (ref 38.4–76.8)
Platelets: 137 10*3/uL — ABNORMAL LOW (ref 145–400)
WBC: 5.5 10*3/uL (ref 3.9–10.3)
lymph#: 0.4 10*3/uL — ABNORMAL LOW (ref 0.9–3.3)

## 2011-12-02 LAB — COMPREHENSIVE METABOLIC PANEL
ALT: 14 U/L (ref 0–35)
CO2: 26 mEq/L (ref 19–32)
Calcium: 9.8 mg/dL (ref 8.4–10.5)
Chloride: 101 mEq/L (ref 96–112)
Creatinine, Ser: 0.98 mg/dL (ref 0.50–1.10)
Glucose, Bld: 119 mg/dL — ABNORMAL HIGH (ref 70–99)
Sodium: 138 mEq/L (ref 135–145)
Total Protein: 6.6 g/dL (ref 6.0–8.3)

## 2011-12-02 LAB — UA PROTEIN, DIPSTICK - CHCC: Protein, ur: NEGATIVE mg/dL

## 2011-12-09 ENCOUNTER — Other Ambulatory Visit (HOSPITAL_BASED_OUTPATIENT_CLINIC_OR_DEPARTMENT_OTHER): Payer: Medicare Other | Admitting: Lab

## 2011-12-09 DIAGNOSIS — C3410 Malignant neoplasm of upper lobe, unspecified bronchus or lung: Secondary | ICD-10-CM

## 2011-12-09 DIAGNOSIS — Z5111 Encounter for antineoplastic chemotherapy: Secondary | ICD-10-CM

## 2011-12-09 DIAGNOSIS — C3490 Malignant neoplasm of unspecified part of unspecified bronchus or lung: Secondary | ICD-10-CM

## 2011-12-09 LAB — CBC WITH DIFFERENTIAL/PLATELET
BASO%: 2 % (ref 0.0–2.0)
EOS%: 1.3 % (ref 0.0–7.0)
MCH: 31.2 pg (ref 25.1–34.0)
MCHC: 33.6 g/dL (ref 31.5–36.0)
MONO#: 0.4 10*3/uL (ref 0.1–0.9)
RDW: 19.2 % — ABNORMAL HIGH (ref 11.2–14.5)
WBC: 3.9 10*3/uL (ref 3.9–10.3)
lymph#: 0.3 10*3/uL — ABNORMAL LOW (ref 0.9–3.3)

## 2011-12-09 LAB — COMPREHENSIVE METABOLIC PANEL
ALT: 12 U/L (ref 0–35)
AST: 13 U/L (ref 0–37)
Albumin: 3.8 g/dL (ref 3.5–5.2)
Calcium: 9.6 mg/dL (ref 8.4–10.5)
Chloride: 105 mEq/L (ref 96–112)
Potassium: 4.1 mEq/L (ref 3.5–5.3)
Sodium: 141 mEq/L (ref 135–145)
Total Protein: 5.8 g/dL — ABNORMAL LOW (ref 6.0–8.3)

## 2011-12-11 ENCOUNTER — Other Ambulatory Visit: Payer: Self-pay | Admitting: Internal Medicine

## 2011-12-14 ENCOUNTER — Telehealth: Payer: Self-pay | Admitting: *Deleted

## 2011-12-14 ENCOUNTER — Telehealth: Payer: Self-pay | Admitting: Internal Medicine

## 2011-12-14 ENCOUNTER — Other Ambulatory Visit (HOSPITAL_BASED_OUTPATIENT_CLINIC_OR_DEPARTMENT_OTHER): Payer: Medicare Other | Admitting: Lab

## 2011-12-14 ENCOUNTER — Ambulatory Visit (HOSPITAL_BASED_OUTPATIENT_CLINIC_OR_DEPARTMENT_OTHER): Payer: Medicare Other | Admitting: Physician Assistant

## 2011-12-14 VITALS — BP 115/63 | HR 107 | Temp 97.5°F | Ht 64.0 in | Wt 150.2 lb

## 2011-12-14 DIAGNOSIS — C3490 Malignant neoplasm of unspecified part of unspecified bronchus or lung: Secondary | ICD-10-CM

## 2011-12-14 DIAGNOSIS — C3410 Malignant neoplasm of upper lobe, unspecified bronchus or lung: Secondary | ICD-10-CM

## 2011-12-14 DIAGNOSIS — Z5111 Encounter for antineoplastic chemotherapy: Secondary | ICD-10-CM

## 2011-12-14 DIAGNOSIS — R350 Frequency of micturition: Secondary | ICD-10-CM

## 2011-12-14 LAB — COMPREHENSIVE METABOLIC PANEL
AST: 15 U/L (ref 0–37)
Albumin: 4.2 g/dL (ref 3.5–5.2)
Alkaline Phosphatase: 61 U/L (ref 39–117)
BUN: 15 mg/dL (ref 6–23)
Creatinine, Ser: 0.82 mg/dL (ref 0.50–1.10)
Potassium: 4.2 mEq/L (ref 3.5–5.3)

## 2011-12-14 LAB — CBC WITH DIFFERENTIAL/PLATELET
BASO%: 1.3 % (ref 0.0–2.0)
Basophils Absolute: 0.1 10*3/uL (ref 0.0–0.1)
EOS%: 1.1 % (ref 0.0–7.0)
HGB: 12.4 g/dL (ref 11.6–15.9)
MCH: 31.6 pg (ref 25.1–34.0)
MCHC: 33.5 g/dL (ref 31.5–36.0)
MCV: 94.5 fL (ref 79.5–101.0)
MONO%: 8.1 % (ref 0.0–14.0)
RBC: 3.9 10*6/uL (ref 3.70–5.45)
RDW: 19.8 % — ABNORMAL HIGH (ref 11.2–14.5)

## 2011-12-14 MED ORDER — TRAMADOL HCL 50 MG PO TABS: 50.0000 mg | ORAL_TABLET | Freq: Four times a day (QID) | ORAL | Status: DC | PRN

## 2011-12-14 NOTE — Telephone Encounter (Signed)
pt will come by  on 06/26 and pick up june and july schedule

## 2011-12-14 NOTE — Telephone Encounter (Signed)
Per staff message I have scheduled appt. JMW 

## 2011-12-15 ENCOUNTER — Other Ambulatory Visit: Payer: Medicare Other | Admitting: Lab

## 2011-12-15 ENCOUNTER — Other Ambulatory Visit: Payer: Self-pay | Admitting: Medical Oncology

## 2011-12-15 ENCOUNTER — Ambulatory Visit: Payer: Medicare Other | Admitting: Physician Assistant

## 2011-12-15 ENCOUNTER — Other Ambulatory Visit (HOSPITAL_BASED_OUTPATIENT_CLINIC_OR_DEPARTMENT_OTHER): Payer: Medicare Other | Admitting: Lab

## 2011-12-15 ENCOUNTER — Ambulatory Visit (HOSPITAL_BASED_OUTPATIENT_CLINIC_OR_DEPARTMENT_OTHER): Payer: Medicare Other

## 2011-12-15 VITALS — BP 126/71 | HR 84 | Temp 97.6°F

## 2011-12-15 DIAGNOSIS — C3490 Malignant neoplasm of unspecified part of unspecified bronchus or lung: Secondary | ICD-10-CM

## 2011-12-15 DIAGNOSIS — C3410 Malignant neoplasm of upper lobe, unspecified bronchus or lung: Secondary | ICD-10-CM

## 2011-12-15 DIAGNOSIS — R82998 Other abnormal findings in urine: Secondary | ICD-10-CM

## 2011-12-15 DIAGNOSIS — R350 Frequency of micturition: Secondary | ICD-10-CM

## 2011-12-15 DIAGNOSIS — Z5111 Encounter for antineoplastic chemotherapy: Secondary | ICD-10-CM

## 2011-12-15 LAB — URINALYSIS, MICROSCOPIC - CHCC
Bilirubin (Urine): NEGATIVE
Blood: NEGATIVE
Ketones: NEGATIVE mg/dL
pH: 6.5 (ref 4.6–8.0)

## 2011-12-15 MED ORDER — PACLITAXEL CHEMO INJECTION 300 MG/50ML
175.0000 mg/m2 | Freq: Once | INTRAVENOUS | Status: AC
  Administered 2011-12-15: 318 mg via INTRAVENOUS
  Filled 2011-12-15: qty 53

## 2011-12-15 MED ORDER — SODIUM CHLORIDE 0.9 % IV SOLN
Freq: Once | INTRAVENOUS | Status: AC
  Administered 2011-12-15: 13:00:00 via INTRAVENOUS

## 2011-12-15 MED ORDER — CARBOPLATIN CHEMO INJECTION 600 MG/60ML
480.5000 mg | Freq: Once | INTRAVENOUS | Status: AC
  Administered 2011-12-15: 480 mg via INTRAVENOUS
  Filled 2011-12-15: qty 48

## 2011-12-15 MED ORDER — DEXAMETHASONE SODIUM PHOSPHATE 4 MG/ML IJ SOLN
20.0000 mg | Freq: Once | INTRAMUSCULAR | Status: AC
  Administered 2011-12-15: 20 mg via INTRAVENOUS

## 2011-12-15 MED ORDER — SODIUM CHLORIDE 0.9 % IJ SOLN
100.0000 ug | Freq: Once | INTRAVENOUS | Status: AC
  Administered 2011-12-15: 0.1 mg via INTRADERMAL
  Filled 2011-12-15: qty 0.01

## 2011-12-15 MED ORDER — DIPHENHYDRAMINE HCL 50 MG/ML IJ SOLN
50.0000 mg | Freq: Once | INTRAMUSCULAR | Status: AC
  Administered 2011-12-15: 50 mg via INTRAVENOUS

## 2011-12-15 MED ORDER — ONDANSETRON 16 MG/50ML IVPB (CHCC)
16.0000 mg | Freq: Once | INTRAVENOUS | Status: AC
  Administered 2011-12-15: 16 mg via INTRAVENOUS

## 2011-12-15 MED ORDER — HEPARIN SOD (PORK) LOCK FLUSH 100 UNIT/ML IV SOLN
500.0000 [IU] | Freq: Once | INTRAVENOUS | Status: AC | PRN
  Administered 2011-12-15: 500 [IU]
  Filled 2011-12-15: qty 5

## 2011-12-15 MED ORDER — SODIUM CHLORIDE 0.9 % IJ SOLN
10.0000 mL | INTRAMUSCULAR | Status: DC | PRN
  Administered 2011-12-15: 10 mL
  Filled 2011-12-15: qty 10

## 2011-12-15 MED ORDER — FAMOTIDINE IN NACL 20-0.9 MG/50ML-% IV SOLN
20.0000 mg | Freq: Once | INTRAVENOUS | Status: AC
  Administered 2011-12-15: 20 mg via INTRAVENOUS

## 2011-12-15 MED ORDER — SODIUM CHLORIDE 0.9 % IV SOLN
15.0000 mg/kg | INTRAVENOUS | Status: DC
  Administered 2011-12-15: 1075 mg via INTRAVENOUS
  Filled 2011-12-15: qty 43

## 2011-12-15 NOTE — Progress Notes (Signed)
Urine culture added per Dimas [place]

## 2011-12-15 NOTE — Progress Notes (Signed)
[facility] [phone]   [phone]  OFFICE PROGRESS NOTE  [name], MD [address] [facility], [place]. [place] [address]  DIAGNOSIS: Recurrent non-small cell lung cancer initially diagnosed as stage IIIA in (T1b N2 M0) in July 2012.   PRIOR THERAPY:  #1 Status post concurrent chemoradiation with weekly carboplatin and paclitaxel, last dose was given 03/01/2011.  #2 status post 3 seconds of consolidation chemotherapy was carboplatin for AUC of 5 and Alimta 500 mg/M2 giving him to see weeks, last dose was given 05/27/2011. [name]   CURRENT THERAPY: None, but the patient was started this weekly for a cycle of systemic chemotherapy with carboplatin for AUC of 5, paclitaxel 175 mg/M2 and Avastin 15 mg/kg every 3 weeks. Status post 4 cycles   INTERVAL HISTORY: [name] 78 y.o. female returns to the clinic today for followup visit accompanied by her husband. The patient tolerated the last cycle of her chemotherapy fairly well with th exception of decreased energy. The decrease in energy has gotten worse after each cycle of chemotherapy. She has had some mild constipation that is well managed with stool softeners. She has noticed some urinary frequency and a bad odor to her urine over the past couple of days. She denied fever or chills. She reports that her eyes are itchy and feel gummy in the mornings but denied any specific exudate.  She denied having any significant chest pain or shortness of breath. She has no significant weight loss or night sweats. She notes some mild foot pain and numbness in her fingers.  MEDICAL HISTORY: Past Medical History  Diagnosis Date  . Lung mass   . History of radiation therapy 7/31/212-03/08/2011    right upper lobe lung adenocarcinoma  . COPD (chronic obstructive pulmonary disease)   . Hypertension   . Hypercholesterolemia   . Osteoporosis   . History of chemotherapy    carboplatin/paclitaxel  . Cancer     , right upper lobe    ALLERGIES:  is allergic to aleve and zyban.  MEDICATIONS:  Current Outpatient Prescriptions  Medication Sig Dispense Refill  . aspirin 81 MG tablet Take 160 mg by mouth as needed.        . colesevelam (WELCHOL) 625 MG tablet Take 1,875 mg by mouth at bedtime.        . docusate sodium (COLACE) 100 MG capsule Take 100 mg by mouth 2 (two) times daily as needed.        . lidocaine-prilocaine (EMLA) cream       . LORazepam (ATIVAN) 1 MG tablet Taken prior to CT scans      . losartan-hydrochlorothiazide (HYZAAR) 50-12.5 MG per tablet Take 1 tablet by mouth daily.        [name] oxyCODONE-acetaminophen (PERCOCET) 5-325 MG per tablet Take by mouth as needed.       . Thiamine HCl (VITAMIN B-1 PO) Take by mouth daily.      . traMADol (ULTRAM) 50 MG tablet Take 1 tablet (50 mg total) by mouth every 6 (six) hours as needed. Maximum dose= 8 tablets per day  60 tablet  0  . Vitamin D, Ergocalciferol, (DRISDOL) 50000 UNITS CAPS Take 50,000 Units by mouth every 7 (seven) days.        SURGICAL HISTORY:  Past Surgical History  Procedure Date  . Abdominal hysterectomy     s/p for fibroid tumor  . Portacath placement     right ij  REVIEW OF SYSTEMS:  Pertinent items are noted in HPI.   PHYSICAL EXAMINATION: General appearance: alert, cooperative and no distress Head: Normocephalic, without obvious abnormality, atraumatic Neck: no adenopathy Lymph nodes: Cervical, supraclavicular, and axillary nodes normal. Resp: clear to auscultation bilaterally Cardio: regular rate and rhythm, S1, S2 normal, no murmur, click, rub or gallop GI: soft, non-tender; bowel sounds normal; no masses,  no organomegaly, no suprapubic tenderness Extremities: extremities normal, atraumatic, no cyanosis or edema Neurologic: Alert and oriented X 3, normal strength and tone. Normal symmetric reflexes. Normal coordination and gait  ECOG PERFORMANCE STATUS: 1 -  Symptomatic but completely ambulatory  Blood pressure 115/63, pulse 107, temperature 97.5 F (36.4 C), temperature source Oral, height 5\' 4"  (1.626 m), weight 150 lb 3.2 oz (68.13 kg).  LABORATORY DATA: Lab Results  Component Value Date   WBC 5.6 12/14/2011   HGB 12.4 12/14/2011   HCT 36.9 12/14/2011   MCV 94.5 12/14/2011   PLT 162 12/14/2011      Chemistry      Component Value Date/Time   NA 138 12/14/2011 0925   NA 142 09/16/2011 0917   K 4.2 12/14/2011 0925   K 3.7 09/16/2011 0917   CL 103 12/14/2011 0925   CL 106 09/16/2011 0917   CO2 25 12/14/2011 0925   CO2 29 09/16/2011 0917   BUN 15 12/14/2011 0925   BUN 16 09/16/2011 0917   CREATININE 0.82 12/14/2011 0925   CREATININE 0.8 09/16/2011 0917      Component Value Date/Time   CALCIUM 9.2 12/14/2011 0925   CALCIUM 8.3 09/16/2011 0917   ALKPHOS 61 12/14/2011 0925   ALKPHOS 62 09/16/2011 0917   AST 15 12/14/2011 0925   AST 18 09/16/2011 0917   ALT 13 12/14/2011 0925   BILITOT 0.5 12/14/2011 0925   BILITOT 0.60 09/16/2011 0917       RADIOGRAPHIC STUDIES: Ct Chest W Contrast  11/19/2011  *RADIOLOGY REPORT*  Clinical Data:  Chemotherapy in progress.  Radiation therapy complete.  Lung cancer diagnosed in July 2012.  CT CHEST, ABDOMEN AND PELVIS WITH CONTRAST  Technique:  Multidetector CT imaging of the chest, abdomen and pelvis was performed following the standard protocol during bolus administration of intravenous contrast.  Contrast:  100 ml Omnipaque 300  Comparison:  CT 03/28 1013  CT CHEST  Findings:  There is a port in the right anterior chest wall.  No axillary or supraclavicular lymphadenopathy.  No mediastinal or hilar lymphadenopathy.  No pericardial fluid.  Coronary calcifications are present.  Esophagus is normal.  There is linear fibrosis in the paramediastinal right upper lobe consistent radiation injury.  Bilateral small pulmonary nodules are again demonstrated.  There these are not increased in size compared to prior.  For example  right lower lobe nodule measures 4 mm (image 41) compared to 5 mm on prior.  Pleural based nodule in the right lower lobe measures 4 mm (image 39) not changed from 5 mm on prior. 4 mm nodule in the medial left lung base (image 36) decreased from 5 mm on prior.  Focal consolidation in the right lower lobe (image 26)  is slightly less dense than prior.  No new pulmonary nodules are present.  IMPRESSION:  1.  Interval decrease in size of multiple small pulmonary metastasis.  No new metastatic lesions identified. 2.  Stable focus of consolidation in the right lower lobe.  3.  Stable paramediastinal radiation change in the right upper lobe.  CT ABDOMEN AND PELVIS  Findings:  Small ill-defined hypodense lesion in the superior aspect of the left lateral hepatic lobe measures 5 mm.  This is faintly visible on comparison exam of 09/16/2011 .    Gallstones in the gallbladder.  The pancreas, spleen, adrenal glands, and kidneys are unchanged.  The stomach, small bowel, and colon are normal. There are diverticula sigmoid colon with nondistension.  No free fluid the pelvis.  No retroperitoneal or pelvic lymphadenopathy.  No peritoneal disease.  There is a fem-fem bypass graft.  Bladder is normal.  Post hysterectomy anatomy.  Round sclerotic lesion in the in the inferior endplate of the T9 vertebral body which measures 9 mm (image 45).  This is more prominent on the comparison exams of 09/16/2011 not present on the comparison CT of 01/13/2011.  IMPRESSION:  1.  No evidence of metastasis to the abdomen pelvis. 2.  Small Hypodense lesion in the left hepatic lobe.  Recommend attention on follow-up.  3.  Enlarging round sclerotic lesion in T9 vertebral body endplate.  This likely represents an enlarging Schmorl's node. Recommend attention on follow-up to exclude an unlikely solitary skeletal metastasis.  Original Report Authenticated By: Genevive Bi, M.D.   Ct Abdomen Pelvis W Contrast  11/19/2011  *RADIOLOGY REPORT*  Clinical  Data:  Chemotherapy in progress.  Radiation therapy complete.  Lung cancer diagnosed in July 2012.  CT CHEST, ABDOMEN AND PELVIS WITH CONTRAST  Technique:  Multidetector CT imaging of the chest, abdomen and pelvis was performed following the standard protocol during bolus administration of intravenous contrast.  Contrast:  100 ml Omnipaque 300  Comparison:  CT 03/28 1013   CT CHEST  Findings:  There is a port in the right anterior chest wall.  No axillary or supraclavicular lymphadenopathy.  No mediastinal or hilar lymphadenopathy.  No pericardial fluid.  Coronary calcifications are present.  Esophagus is normal.  There is linear fibrosis in the paramediastinal right upper lobe consistent radiation injury.  Bilateral small pulmonary nodules are again demonstrated.  There these are not increased in size compared to prior.  For example right lower lobe nodule measures 4 mm (image 41) compared to 5 mm on prior.  Pleural based nodule in the right lower lobe measures 4 mm (image 39) not changed from 5 mm on prior. 4 mm nodule in the medial left lung base (image 36) decreased from 5 mm on prior.  Focal consolidation in the right lower lobe (image 26)  is slightly less dense than prior.  No new pulmonary nodules are present.  IMPRESSION:  1.  Interval decrease in size of multiple small pulmonary metastasis.  No new metastatic lesions identified. 2.  Stable focus of consolidation in the right lower lobe.  3.  Stable paramediastinal radiation change in the right upper lobe.   CT ABDOMEN AND PELVIS  Findings:  Small ill-defined hypodense lesion in the superior aspect of the left lateral hepatic lobe measures 5 mm.  This is faintly visible on comparison exam of 09/16/2011 .    Gallstones in the gallbladder.  The pancreas, spleen, adrenal glands, and kidneys are unchanged.  The stomach, small bowel, and colon are normal. There are diverticula sigmoid colon with nondistension.  No free fluid the pelvis.  No retroperitoneal or  pelvic lymphadenopathy.  No peritoneal disease.  There is a fem-fem bypass graft.  Bladder is normal.  Post hysterectomy anatomy.  Round sclerotic lesion in the in the inferior endplate of the T9 vertebral body which measures 9 mm (image 45).  This is more  prominent on the comparison exams of 09/16/2011 not present on the comparison CT of 01/13/2011.  IMPRESSION:  1.  No evidence of metastasis to the abdomen pelvis. 2.  Small Hypodense lesion in the left hepatic lobe.  Recommend attention on follow-up.  3.  Enlarging round sclerotic lesion in T9 vertebral body endplate.  This likely represents an enlarging Schmorl's node. Recommend attention on follow-up to exclude an unlikely solitary skeletal metastasis.  Original Report Authenticated By: Genevive Bi, M.D.    ASSESSMENT/PLAN: This is a very pleasant 78 years old white female with recurrent non-small cell lung cancer currently on systemic chemotherapy with carboplatin, paclitaxel and Avastin status post 3 cycles. The patient is tolerating her treatment fairly well and she has evidence for partial response on her last restaging CT scan. The patient was discussed with Dr. Darrold Span in Dr. Asa Lente absence. She will proceed with cycle # 5 of her systemic chemotherapy with carboplatin, paclitaxel and Avastin. We will check a clean catch urinalysis and urine C&S to evaluate her urinary complaints.She will continue with weekly labs and follow up in 3 weeks prior to cycle #6 with a repeat CBC Differential, CMET, and urine protein dipstick. She requests a refill for her tramadol and was given a prescription to address this need.  Laural Benes, Landynn Dupler E ,PA-C   All questions were answered. The patient knows to call the clinic with any problems, questions or concerns. We can certainly see the patient much sooner if necessary.  I spent 20 minutes counseling the patient face to face. The total time spent in the appointment was 30 minutes.

## 2011-12-15 NOTE — Progress Notes (Signed)
1228 Carboplatin Chemo Intradermal test dose initiated. 1233 5 minute skin test unremarkable. 1243 15 minute skin test unremarkable. 1258 30 minute skin test unremarkable.

## 2011-12-15 NOTE — Patient Instructions (Signed)
[facility] Discharge Instructions for Patients Receiving Chemotherapy  Today you received the following chemotherapy agents Avastin, Carboplatin and Taxol.  To help prevent nausea and vomiting after your treatment, we encourage you to take your nausea medication. Begin taking it as often as prescribed for by Dr. Arbutus Ped.    If you develop nausea and vomiting that is not controlled by your nausea medication, call the clinic. If it is after clinic hours your family physician or the after hours number for the clinic or go to the Emergency Department.   BELOW ARE SYMPTOMS THAT SHOULD BE REPORTED IMMEDIATELY:  *FEVER GREATER THAN 100.5 F  *CHILLS WITH OR WITHOUT FEVER  NAUSEA AND VOMITING THAT IS NOT CONTROLLED WITH YOUR NAUSEA MEDICATION  *UNUSUAL SHORTNESS OF BREATH  *UNUSUAL BRUISING OR BLEEDING  TENDERNESS IN MOUTH AND THROAT WITH OR WITHOUT PRESENCE OF ULCERS  *URINARY PROBLEMS  *BOWEL PROBLEMS  UNUSUAL RASH Items with * indicate a potential emergency and should be followed up as soon as possible.  One of the nurses will contact you 24 hours after your treatment. Please let the nurse know about any problems that you may have experienced. Feel free to call the clinic you have any questions or concerns. The clinic phone number is [phone].   I have been informed and understand all the instructions given to me. I know to contact the clinic, my physician, or go to the Emergency Department if any problems should occur. I do not have any questions at this time, but understand that I may call the clinic during office hours   should I have any questions or need assistance in obtaining follow up care.    __________________________________________  _____________  __________ Signature of Patient or Authorized Representative            Date                   Time    __________________________________________ Nurse's Signature

## 2011-12-16 ENCOUNTER — Other Ambulatory Visit: Payer: Medicare Other | Admitting: Lab

## 2011-12-16 ENCOUNTER — Ambulatory Visit (HOSPITAL_BASED_OUTPATIENT_CLINIC_OR_DEPARTMENT_OTHER): Payer: Medicare Other

## 2011-12-16 VITALS — BP 115/78 | HR 98 | Temp 97.1°F

## 2011-12-16 DIAGNOSIS — C3410 Malignant neoplasm of upper lobe, unspecified bronchus or lung: Secondary | ICD-10-CM

## 2011-12-16 DIAGNOSIS — D709 Neutropenia, unspecified: Secondary | ICD-10-CM

## 2011-12-16 DIAGNOSIS — C3490 Malignant neoplasm of unspecified part of unspecified bronchus or lung: Secondary | ICD-10-CM

## 2011-12-16 MED ORDER — FILGRASTIM 300 MCG/0.5ML IJ SOLN
300.0000 ug | Freq: Once | INTRAMUSCULAR | Status: AC
  Administered 2011-12-16: 300 ug via SUBCUTANEOUS
  Filled 2011-12-16: qty 0.5

## 2011-12-17 ENCOUNTER — Ambulatory Visit (HOSPITAL_BASED_OUTPATIENT_CLINIC_OR_DEPARTMENT_OTHER): Payer: Medicare Other

## 2011-12-17 VITALS — BP 123/78 | HR 103 | Temp 97.0°F

## 2011-12-17 DIAGNOSIS — D709 Neutropenia, unspecified: Secondary | ICD-10-CM

## 2011-12-17 DIAGNOSIS — C3490 Malignant neoplasm of unspecified part of unspecified bronchus or lung: Secondary | ICD-10-CM

## 2011-12-17 DIAGNOSIS — C3410 Malignant neoplasm of upper lobe, unspecified bronchus or lung: Secondary | ICD-10-CM

## 2011-12-17 MED ORDER — FILGRASTIM 300 MCG/0.5ML IJ SOLN
300.0000 ug | Freq: Once | INTRAMUSCULAR | Status: AC
  Administered 2011-12-17: 300 ug via SUBCUTANEOUS
  Filled 2011-12-17: qty 0.5

## 2011-12-18 ENCOUNTER — Ambulatory Visit (HOSPITAL_BASED_OUTPATIENT_CLINIC_OR_DEPARTMENT_OTHER): Payer: Medicare Other

## 2011-12-18 VITALS — BP 124/75 | HR 99 | Temp 97.5°F

## 2011-12-18 DIAGNOSIS — C3490 Malignant neoplasm of unspecified part of unspecified bronchus or lung: Secondary | ICD-10-CM

## 2011-12-18 DIAGNOSIS — D709 Neutropenia, unspecified: Secondary | ICD-10-CM

## 2011-12-18 LAB — URINE CULTURE

## 2011-12-18 MED ORDER — FILGRASTIM 300 MCG/0.5ML IJ SOLN
300.0000 ug | Freq: Once | INTRAMUSCULAR | Status: AC
  Administered 2011-12-18: 300 ug via SUBCUTANEOUS

## 2011-12-18 NOTE — Patient Instructions (Signed)
Call the MD for complications.

## 2011-12-20 ENCOUNTER — Other Ambulatory Visit: Payer: Self-pay | Admitting: *Deleted

## 2011-12-20 ENCOUNTER — Ambulatory Visit (HOSPITAL_BASED_OUTPATIENT_CLINIC_OR_DEPARTMENT_OTHER): Payer: Medicare Other

## 2011-12-20 VITALS — BP 91/66 | HR 104 | Temp 96.7°F

## 2011-12-20 DIAGNOSIS — C3490 Malignant neoplasm of unspecified part of unspecified bronchus or lung: Secondary | ICD-10-CM

## 2011-12-20 DIAGNOSIS — D709 Neutropenia, unspecified: Secondary | ICD-10-CM

## 2011-12-20 MED ORDER — FILGRASTIM 300 MCG/0.5ML IJ SOLN
300.0000 ug | Freq: Once | INTRAMUSCULAR | Status: AC
  Administered 2011-12-20: 300 ug via SUBCUTANEOUS
  Filled 2011-12-20: qty 0.5

## 2011-12-20 MED ORDER — CIPROFLOXACIN HCL 500 MG PO TABS: 500.0000 mg | ORAL_TABLET | Freq: Two times a day (BID) | ORAL | Status: AC

## 2011-12-21 ENCOUNTER — Other Ambulatory Visit (HOSPITAL_BASED_OUTPATIENT_CLINIC_OR_DEPARTMENT_OTHER): Payer: Medicare Other | Admitting: Lab

## 2011-12-21 ENCOUNTER — Ambulatory Visit (HOSPITAL_BASED_OUTPATIENT_CLINIC_OR_DEPARTMENT_OTHER): Payer: Medicare Other

## 2011-12-21 VITALS — BP 103/69 | HR 101 | Temp 97.0°F

## 2011-12-21 DIAGNOSIS — C3490 Malignant neoplasm of unspecified part of unspecified bronchus or lung: Secondary | ICD-10-CM

## 2011-12-21 DIAGNOSIS — D709 Neutropenia, unspecified: Secondary | ICD-10-CM

## 2011-12-21 LAB — CBC WITH DIFFERENTIAL/PLATELET
BASO%: 0.2 % (ref 0.0–2.0)
EOS%: 1.5 % (ref 0.0–7.0)
MCH: 31.9 pg (ref 25.1–34.0)
MCV: 95.3 fL (ref 79.5–101.0)
MONO%: 6.8 % (ref 0.0–14.0)
RBC: 3.75 10*6/uL (ref 3.70–5.45)
RDW: 18.8 % — ABNORMAL HIGH (ref 11.2–14.5)

## 2011-12-21 LAB — COMPREHENSIVE METABOLIC PANEL
AST: 15 U/L (ref 0–37)
Albumin: 3.9 g/dL (ref 3.5–5.2)
Alkaline Phosphatase: 76 U/L (ref 39–117)
Potassium: 3.7 mEq/L (ref 3.5–5.3)
Sodium: 136 mEq/L (ref 135–145)
Total Protein: 6.3 g/dL (ref 6.0–8.3)

## 2011-12-21 MED ORDER — FILGRASTIM 300 MCG/0.5ML IJ SOLN
300.0000 ug | Freq: Once | INTRAMUSCULAR | Status: AC
  Administered 2011-12-21: 300 ug via SUBCUTANEOUS
  Filled 2011-12-21: qty 0.5

## 2011-12-24 ENCOUNTER — Encounter: Payer: Self-pay | Admitting: Neurosurgery

## 2011-12-27 ENCOUNTER — Ambulatory Visit (INDEPENDENT_AMBULATORY_CARE_PROVIDER_SITE_OTHER): Payer: Medicare Other | Admitting: Neurosurgery

## 2011-12-27 ENCOUNTER — Ambulatory Visit (INDEPENDENT_AMBULATORY_CARE_PROVIDER_SITE_OTHER): Payer: Medicare Other | Admitting: *Deleted

## 2011-12-27 ENCOUNTER — Encounter: Payer: Self-pay | Admitting: Neurosurgery

## 2011-12-27 VITALS — BP 106/69 | HR 88 | Resp 16 | Ht 64.5 in | Wt 148.8 lb

## 2011-12-27 DIAGNOSIS — I70219 Atherosclerosis of native arteries of extremities with intermittent claudication, unspecified extremity: Secondary | ICD-10-CM

## 2011-12-27 DIAGNOSIS — Z48812 Encounter for surgical aftercare following surgery on the circulatory system: Secondary | ICD-10-CM

## 2011-12-27 DIAGNOSIS — I739 Peripheral vascular disease, unspecified: Secondary | ICD-10-CM | POA: Insufficient documentation

## 2011-12-27 NOTE — Addendum Note (Signed)
Addended by: Sharee Pimple on: 12/27/2011 10:21 AM   Modules accepted: Orders

## 2011-12-27 NOTE — Progress Notes (Signed)
[facility] Office Note  CC: Annual lower extremity ABIs for surveillance of left-to-right fem-fem bypass graft June 2012 Referring Physician: Darrick Penna  History of Present Illness: 78 year old female patient of Dr. Darrick Penna who is status post a left common femoral endarterectomy and left to right femoral to femoral bypass in June 2010. The patient denies claudication, rest pain and has no open ulcerations. Patient has been treated for lung cancer for the last year and is still under treatment.  Past Medical History  Diagnosis Date  . Lung mass   . History of radiation therapy 7/31/212-03/08/2011    right upper lobe lung adenocarcinoma  . COPD (chronic obstructive pulmonary disease)   . Hypertension   . Hypercholesterolemia   . Osteoporosis   . History of chemotherapy     carboplatin/paclitaxel  . Cancer July 2012    , right upper lobe    ROS: [x]  Positive   [ ]  Denies    General: [ ]  Weight loss, [ ]  Fever, [ ]  chills Neurologic: [ ]  Dizziness, [ ]  Blackouts, [ ]  Seizure [ ]  Stroke, [ ]  "Mini stroke", [ ]  Slurred speech, [ ]  Temporary blindness; [ ]  weakness in arms or legs, [ ]  Hoarseness Cardiac: [ ]  Chest pain/pressure, [ ]  Shortness of breath at rest [ ]  Shortness of breath with exertion, [ ]  Atrial fibrillation or irregular heartbeat Vascular: [ ]  Pain in legs with walking, [ ]  Pain in legs at rest, [ ]  Pain in legs at night,  [ ]  Non-healing ulcer, [ ]  Blood clot in vein/DVT,   Pulmonary: [ ]  Home oxygen, [ ]  Productive cough, [ ]  Coughing up blood, [ ]  Asthma,  [ ]  Wheezing Musculoskeletal:  [ ]  Arthritis, [ ]  Low back pain, [ ]  Joint pain Hematologic: [ ]  Easy Bruising, [ ]  Anemia; [ ]  Hepatitis Gastrointestinal: [ ]  Blood in stool, [ ]  Gastroesophageal Reflux/heartburn, [ ]  Trouble swallowing Urinary: [ ]  chronic Kidney disease, [ ]  on HD - [ ]  MWF or [ ]  TTHS, [ ]  Burning with urination, [ ]  Difficulty urinating Skin: [ ]  Rashes, [ ]   Wounds Psychological: [ ]  Anxiety, [ ]  Depression   Social History History  Substance Use Topics  . Smoking status: Former Smoker -- 1.0 packs/day for 50 years    Types: Cigarettes    Quit date: 06/21/2006  . Smokeless tobacco: Not on file   Comment: quit 2009  . Alcohol Use: Yes     occasional    Family History Family History  Problem Relation Age of Onset  . Breast cancer Mother   . Breast cancer Sister     2 sisters   . Ovarian cancer Maternal Aunt   . Breast cancer Maternal Grandmother     Allergies  Allergen Reactions  . Aleve (Naproxen Sodium) Rash  . Zyban (Bupropion Hcl) Rash    Current Outpatient Prescriptions  Medication Sig Dispense Refill  . colesevelam (WELCHOL) 625 MG tablet Take 1,875 mg by mouth at bedtime.        . lidocaine-prilocaine (EMLA) cream       . LORazepam (ATIVAN) 1 MG tablet Taken prior to CT scans      . losartan-hydrochlorothiazide (HYZAAR) 50-12.5 MG per tablet Take 1 tablet by mouth daily.        [name] oxyCODONE-acetaminophen (PERCOCET) 5-325 MG per tablet Take by mouth as needed.       . Thiamine HCl (VITAMIN B-1 PO) Take by mouth  daily.      . traMADol (ULTRAM) 50 MG tablet Take 1 tablet (50 mg total) by mouth every 6 (six) hours as needed. Maximum dose= 8 tablets per day  60 tablet  0  . aspirin 81 MG tablet Take 160 mg by mouth as needed.        . ciprofloxacin (CIPRO) 500 MG tablet Take 1 tablet (500 mg total) by mouth 2 (two) times daily.  6 tablet  0  . docusate sodium (COLACE) 100 MG capsule Take 100 mg by mouth 2 (two) times daily as needed.        . Vitamin D, Ergocalciferol, (DRISDOL) 50000 UNITS CAPS Take 50,000 Units by mouth every 7 (seven) days.        Physical Examination  Filed Vitals:   12/27/11 0953  BP: 106/69  Pulse: 88  Resp: 16    Body mass index is 25.15 kg/(m^2).  General:  WDWN in NAD Gait: Normal HEENT: WNL Eyes: Pupils equal Pulmonary: normal non-labored breathing , without Rales, rhonchi,   wheezing Cardiac: RRR, without  Murmurs, rubs or gallops; No carotid bruits Abdomen: soft, NT, no masses Skin: no rashes, ulcers noted Vascular Exam/Pulses: Palpable PT and DP pulses bilaterally, palpable femoral pulse on the right  Extremities without ischemic changes, no Gangrene , no cellulitis; no open wounds;  Musculoskeletal: no muscle wasting or atrophy  Neurologic: A&O X 3; Appropriate Affect ; SENSATION: normal; MOTOR FUNCTION:  moving all extremities equally. Speech is fluent/normal  Non-Invasive Vascular Imaging: ABIs today are 1.23 and biphasic to triphasic on the right, 1.17 and triphasic on the left  ASSESSMENT/PLAN: Asymptomatic patient with a history of left to right fem-fem bypass June 2012. Patient's doing well, she will followup here in one year with repeat ABIs. Her questions were encouraged and answered.  Lauree Chandler ANP  Clinic M.D.: Myra Gianotti

## 2011-12-28 ENCOUNTER — Other Ambulatory Visit (HOSPITAL_BASED_OUTPATIENT_CLINIC_OR_DEPARTMENT_OTHER): Payer: Medicare Other | Admitting: Lab

## 2011-12-28 ENCOUNTER — Telehealth: Payer: Self-pay | Admitting: Medical Oncology

## 2011-12-28 DIAGNOSIS — C3490 Malignant neoplasm of unspecified part of unspecified bronchus or lung: Secondary | ICD-10-CM

## 2011-12-28 DIAGNOSIS — C3410 Malignant neoplasm of upper lobe, unspecified bronchus or lung: Secondary | ICD-10-CM

## 2011-12-28 LAB — COMPREHENSIVE METABOLIC PANEL
ALT: 14 U/L (ref 0–35)
AST: 16 U/L (ref 0–37)
Alkaline Phosphatase: 60 U/L (ref 39–117)
Creatinine, Ser: 0.71 mg/dL (ref 0.50–1.10)
Total Bilirubin: 0.5 mg/dL (ref 0.3–1.2)

## 2011-12-28 LAB — CBC WITH DIFFERENTIAL/PLATELET
BASO%: 1.1 % (ref 0.0–2.0)
EOS%: 1.7 % (ref 0.0–7.0)
HCT: 34.4 % — ABNORMAL LOW (ref 34.8–46.6)
LYMPH%: 12.3 % — ABNORMAL LOW (ref 14.0–49.7)
MCH: 32.4 pg (ref 25.1–34.0)
MCHC: 33.8 g/dL (ref 31.5–36.0)
MONO%: 13.1 % (ref 0.0–14.0)
NEUT%: 71.8 % (ref 38.4–76.8)
Platelets: 92 10*3/uL — ABNORMAL LOW (ref 145–400)

## 2011-12-28 NOTE — Telephone Encounter (Signed)
Pt notified that Dr Donnald Garre will not order ca-125 as it is not specific to her cancer-pt voices understanding

## 2011-12-30 ENCOUNTER — Ambulatory Visit: Payer: Medicare Other | Admitting: Radiation Oncology

## 2012-01-04 ENCOUNTER — Encounter: Payer: Self-pay | Admitting: Physician Assistant

## 2012-01-04 ENCOUNTER — Other Ambulatory Visit (HOSPITAL_BASED_OUTPATIENT_CLINIC_OR_DEPARTMENT_OTHER): Payer: Medicare Other | Admitting: Lab

## 2012-01-04 ENCOUNTER — Ambulatory Visit (HOSPITAL_BASED_OUTPATIENT_CLINIC_OR_DEPARTMENT_OTHER): Payer: Medicare Other | Admitting: Physician Assistant

## 2012-01-04 ENCOUNTER — Ambulatory Visit (HOSPITAL_BASED_OUTPATIENT_CLINIC_OR_DEPARTMENT_OTHER): Payer: Medicare Other

## 2012-01-04 ENCOUNTER — Telehealth: Payer: Self-pay | Admitting: Internal Medicine

## 2012-01-04 VITALS — BP 121/67 | HR 93 | Temp 97.0°F | Ht 64.5 in | Wt 149.1 lb

## 2012-01-04 DIAGNOSIS — R05 Cough: Secondary | ICD-10-CM

## 2012-01-04 DIAGNOSIS — C3490 Malignant neoplasm of unspecified part of unspecified bronchus or lung: Secondary | ICD-10-CM

## 2012-01-04 DIAGNOSIS — C3410 Malignant neoplasm of upper lobe, unspecified bronchus or lung: Secondary | ICD-10-CM

## 2012-01-04 DIAGNOSIS — D709 Neutropenia, unspecified: Secondary | ICD-10-CM

## 2012-01-04 DIAGNOSIS — K5900 Constipation, unspecified: Secondary | ICD-10-CM

## 2012-01-04 LAB — CBC WITH DIFFERENTIAL/PLATELET
BASO%: 1 % (ref 0.0–2.0)
HCT: 34.5 % — ABNORMAL LOW (ref 34.8–46.6)
LYMPH%: 6.7 % — ABNORMAL LOW (ref 14.0–49.7)
MCH: 32.8 pg (ref 25.1–34.0)
MCHC: 33.9 g/dL (ref 31.5–36.0)
MONO#: 0.2 10*3/uL (ref 0.1–0.9)
NEUT%: 86 % — ABNORMAL HIGH (ref 38.4–76.8)
Platelets: 104 10*3/uL — ABNORMAL LOW (ref 145–400)
WBC: 3.4 10*3/uL — ABNORMAL LOW (ref 3.9–10.3)

## 2012-01-04 LAB — COMPREHENSIVE METABOLIC PANEL
ALT: 14 U/L (ref 0–35)
CO2: 27 mEq/L (ref 19–32)
Creatinine, Ser: 0.81 mg/dL (ref 0.50–1.10)
Total Bilirubin: 0.5 mg/dL (ref 0.3–1.2)

## 2012-01-04 MED ORDER — PROCHLORPERAZINE MALEATE 10 MG PO TABS: 10.0000 mg | ORAL_TABLET | Freq: Four times a day (QID) | ORAL | Status: DC | PRN

## 2012-01-04 NOTE — Progress Notes (Signed)
[facility] [phone]   [phone]  OFFICE PROGRESS NOTE  [name], MD [address] [facility], [place]. [place] [address]  DIAGNOSIS: Recurrent non-small cell lung cancer initially diagnosed as stage IIIA in (T1b N2 M0) in July 2012.   PRIOR THERAPY:  #1 Status post concurrent chemoradiation with weekly carboplatin and paclitaxel, last dose was given 03/01/2011.  #2 status post 3 seconds of consolidation chemotherapy was carboplatin for AUC of 5 and Alimta 500 mg/M2 giving him to see weeks, last dose was given 05/27/2011. [name]   CURRENT THERAPY: None, but the patient was started this weekly for a cycle of systemic chemotherapy with carboplatin for AUC of 5, paclitaxel 175 mg/M2 and Avastin 15 mg/kg every 3 weeks. Status post 5 cycles   INTERVAL HISTORY: ADILEN PAVELKO 78 y.o. female returns to the clinic today for followup visit accompanied by her daughter, Babette Relic. The patient tolerated the last cycle of her chemotherapy fairly well with the exception of decreased energy. She reports some constipation and is currently taking a stool softeners as well as MiraLAX. She has a known history of hemorrhoids. She's noticed a little bright red blood when she wipes but no Frank blood in her stool or in the commode. She often feels chilly but has had no frank fever or chills. She reports a dry cough and occasional nausea that is well-controlled with her current antibiotic. She does need a refill for her antiemetic.   MEDICAL HISTORY: Past Medical History  Diagnosis Date  . Lung mass   . History of radiation therapy 7/31/212-03/08/2011    right upper lobe lung adenocarcinoma  . COPD (chronic obstructive pulmonary disease)   . Hypertension   . Hypercholesterolemia   . Osteoporosis   . History of chemotherapy     carboplatin/paclitaxel  . Cancer July 2012    , right upper lobe    ALLERGIES:  is allergic to aleve and  zyban.  MEDICATIONS:  Current Outpatient Prescriptions  Medication Sig Dispense Refill  . aspirin 81 MG tablet Take 160 mg by mouth as needed.        . ciprofloxacin (CIPRO) 500 MG tablet       . colesevelam (WELCHOL) 625 MG tablet Take 1,875 mg by mouth at bedtime.        . docusate sodium (COLACE) 100 MG capsule Take 100 mg by mouth 2 (two) times daily as needed.        . lidocaine-prilocaine (EMLA) cream       . LORazepam (ATIVAN) 1 MG tablet Taken prior to CT scans      . losartan-hydrochlorothiazide (HYZAAR) 50-12.5 MG per tablet Take 1 tablet by mouth daily.        [name] oxyCODONE-acetaminophen (PERCOCET) 5-325 MG per tablet Take by mouth as needed.       . prochlorperazine (COMPAZINE) 10 MG tablet Take 1 tablet (10 mg total) by mouth every 6 (six) hours as needed.  30 tablet  1  . Thiamine HCl (VITAMIN B-1 PO) Take by mouth daily.      . traMADol (ULTRAM) 50 MG tablet Take 1 tablet (50 mg total) by mouth every 6 (six) hours as needed. Maximum dose= 8 tablets per day  60 tablet  0  . Vitamin D, Ergocalciferol, (DRISDOL) 50000 UNITS CAPS Take 50,000 Units by mouth every 7 (seven) days.        SURGICAL HISTORY:  Past Surgical History  Procedure Date  .  Abdominal hysterectomy     s/p for fibroid tumor  . Portacath placement     right ij     REVIEW OF SYSTEMS:  Pertinent items are noted in HPI.   PHYSICAL EXAMINATION: General appearance: alert, cooperative and no distress Head: Normocephalic, without obvious abnormality, atraumatic Neck: no adenopathy Lymph nodes: Cervical, supraclavicular, and axillary nodes normal. Resp: clear to auscultation bilaterally Cardio: regular rate and rhythm, S1, S2 normal, no murmur, click, rub or gallop GI: soft, non-tender; bowel sounds normal; no masses,  no organomegaly, no suprapubic tenderness Extremities: extremities normal, atraumatic, no cyanosis or edema Neurologic: Alert and oriented X 3, normal strength and tone. Normal symmetric  reflexes. Normal coordination and gait  ECOG PERFORMANCE STATUS: 1 - Symptomatic but completely ambulatory  Blood pressure 121/67, pulse 93, temperature 97 F (36.1 C), temperature source Oral, height 5' 4.5" (1.638 m), weight 149 lb 1.6 oz (67.631 kg).  LABORATORY DATA: Lab Results  Component Value Date   WBC 3.4* 01/04/2012   HGB 11.7 01/04/2012   HCT 34.5* 01/04/2012   MCV 96.8 01/04/2012   PLT 104* 01/04/2012      Chemistry      Component Value Date/Time   NA 140 12/28/2011 1011   NA 142 09/16/2011 0917   K 4.2 12/28/2011 1011   K 3.7 09/16/2011 0917   CL 105 12/28/2011 1011   CL 106 09/16/2011 0917   CO2 27 12/28/2011 1011   CO2 29 09/16/2011 0917   BUN 17 12/28/2011 1011   BUN 16 09/16/2011 0917   CREATININE 0.71 12/28/2011 1011   CREATININE 0.8 09/16/2011 0917      Component Value Date/Time   CALCIUM 9.0 12/28/2011 1011   CALCIUM 8.3 09/16/2011 0917   ALKPHOS 60 12/28/2011 1011   ALKPHOS 62 09/16/2011 0917   AST 16 12/28/2011 1011   AST 18 09/16/2011 0917   ALT 14 12/28/2011 1011   BILITOT 0.5 12/28/2011 1011   BILITOT 0.60 09/16/2011 0917       RADIOGRAPHIC STUDIES: Ct Chest W Contrast  11/19/2011  *RADIOLOGY REPORT*  Clinical Data:  Chemotherapy in progress.  Radiation therapy complete.  Lung cancer diagnosed in July 2012.  CT CHEST, ABDOMEN AND PELVIS WITH CONTRAST  Technique:  Multidetector CT imaging of the chest, abdomen and pelvis was performed following the standard protocol during bolus administration of intravenous contrast.  Contrast:  100 ml Omnipaque 300  Comparison:  CT 03/28 1013  CT CHEST  Findings:  There is a port in the right anterior chest wall.  No axillary or supraclavicular lymphadenopathy.  No mediastinal or hilar lymphadenopathy.  No pericardial fluid.  Coronary calcifications are present.  Esophagus is normal.  There is linear fibrosis in the paramediastinal right upper lobe consistent radiation injury.  Bilateral small pulmonary nodules are again demonstrated.  There  these are not increased in size compared to prior.  For example right lower lobe nodule measures 4 mm (image 41) compared to 5 mm on prior.  Pleural based nodule in the right lower lobe measures 4 mm (image 39) not changed from 5 mm on prior. 4 mm nodule in the medial left lung base (image 36) decreased from 5 mm on prior.  Focal consolidation in the right lower lobe (image 26)  is slightly less dense than prior.  No new pulmonary nodules are present.  IMPRESSION:  1.  Interval decrease in size of multiple small pulmonary metastasis.  No new metastatic lesions identified. 2.  Stable focus of consolidation  in the right lower lobe.  3.  Stable paramediastinal radiation change in the right upper lobe.  CT ABDOMEN AND PELVIS  Findings:  Small ill-defined hypodense lesion in the superior aspect of the left lateral hepatic lobe measures 5 mm.  This is faintly visible on comparison exam of 09/16/2011 .    Gallstones in the gallbladder.  The pancreas, spleen, adrenal glands, and kidneys are unchanged.  The stomach, small bowel, and colon are normal. There are diverticula sigmoid colon with nondistension.  No free fluid the pelvis.  No retroperitoneal or pelvic lymphadenopathy.  No peritoneal disease.  There is a fem-fem bypass graft.  Bladder is normal.  Post hysterectomy anatomy.  Round sclerotic lesion in the in the inferior endplate of the T9 vertebral body which measures 9 mm (image 45).  This is more prominent on the comparison exams of 09/16/2011 not present on the comparison CT of 01/13/2011.  IMPRESSION:  1.  No evidence of metastasis to the abdomen pelvis. 2.  Small Hypodense lesion in the left hepatic lobe.  Recommend attention on follow-up.  3.  Enlarging round sclerotic lesion in T9 vertebral body endplate.  This likely represents an enlarging Schmorl's node. Recommend attention on follow-up to exclude an unlikely solitary skeletal metastasis.  Original Report Authenticated By: Genevive Bi, M.D.   Ct  Abdomen Pelvis W Contrast  11/19/2011  *RADIOLOGY REPORT*  Clinical Data:  Chemotherapy in progress.  Radiation therapy complete.  Lung cancer diagnosed in July 2012.  CT CHEST, ABDOMEN AND PELVIS WITH CONTRAST  Technique:  Multidetector CT imaging of the chest, abdomen and pelvis was performed following the standard protocol during bolus administration of intravenous contrast.  Contrast:  100 ml Omnipaque 300  Comparison:  CT 03/28 1013   CT CHEST  Findings:  There is a port in the right anterior chest wall.  No axillary or supraclavicular lymphadenopathy.  No mediastinal or hilar lymphadenopathy.  No pericardial fluid.  Coronary calcifications are present.  Esophagus is normal.  There is linear fibrosis in the paramediastinal right upper lobe consistent radiation injury.  Bilateral small pulmonary nodules are again demonstrated.  There these are not increased in size compared to prior.  For example right lower lobe nodule measures 4 mm (image 41) compared to 5 mm on prior.  Pleural based nodule in the right lower lobe measures 4 mm (image 39) not changed from 5 mm on prior. 4 mm nodule in the medial left lung base (image 36) decreased from 5 mm on prior.  Focal consolidation in the right lower lobe (image 26)  is slightly less dense than prior.  No new pulmonary nodules are present.  IMPRESSION:  1.  Interval decrease in size of multiple small pulmonary metastasis.  No new metastatic lesions identified. 2.  Stable focus of consolidation in the right lower lobe.  3.  Stable paramediastinal radiation change in the right upper lobe.   CT ABDOMEN AND PELVIS  Findings:  Small ill-defined hypodense lesion in the superior aspect of the left lateral hepatic lobe measures 5 mm.  This is faintly visible on comparison exam of 09/16/2011 .    Gallstones in the gallbladder.  The pancreas, spleen, adrenal glands, and kidneys are unchanged.  The stomach, small bowel, and colon are normal. There are diverticula sigmoid colon  with nondistension.  No free fluid the pelvis.  No retroperitoneal or pelvic lymphadenopathy.  No peritoneal disease.  There is a fem-fem bypass graft.  Bladder is normal.  Post hysterectomy anatomy.  Round sclerotic lesion in the in the inferior endplate of the T9 vertebral body which measures 9 mm (image 45).  This is more prominent on the comparison exams of 09/16/2011 not present on the comparison CT of 01/13/2011.  IMPRESSION:  1.  No evidence of metastasis to the abdomen pelvis. 2.  Small Hypodense lesion in the left hepatic lobe.  Recommend attention on follow-up.  3.  Enlarging round sclerotic lesion in T9 vertebral body endplate.  This likely represents an enlarging Schmorl's node. Recommend attention on follow-up to exclude an unlikely solitary skeletal metastasis.  Original Report Authenticated By: Genevive Bi, M.D.    ASSESSMENT/PLAN: This is a very pleasant 78 years old white female with recurrent non-small cell lung cancer currently on systemic chemotherapy with carboplatin, paclitaxel and Avastin status post 5cycles. The patient is tolerating her treatment fairly well and she has evidence for partial response on her last restaging CT scan. The patient was discussed with  Dr. Arbutus Ped. She will proceed with cycle # 6 of her systemic chemotherapy with carboplatin, paclitaxel and Avastin. She will continue with weekly labs. She'll followup with Dr. Arbutus Ped in 3 weeks to repeat CBC differential C. met, urine protein dipstick and a CT of the chest abdomen and pelvis with contrast to reevaluate her disease. A refill for her Compazine was sent to her pharmacy of record via E. Scribe.  Laural Benes, Tj Kitchings E ,PA-C   All questions were answered. The patient knows to call the clinic with any problems, questions or concerns. We can certainly see the patient much sooner if necessary.  I spent 20 minutes counseling the patient face to face. The total time spent in the appointment was 30 minutes.

## 2012-01-04 NOTE — Telephone Encounter (Signed)
appts made and printed for pt aom °

## 2012-01-05 ENCOUNTER — Other Ambulatory Visit: Payer: Self-pay | Admitting: Internal Medicine

## 2012-01-05 ENCOUNTER — Ambulatory Visit (HOSPITAL_BASED_OUTPATIENT_CLINIC_OR_DEPARTMENT_OTHER): Payer: Medicare Other

## 2012-01-05 VITALS — BP 115/65 | HR 89 | Temp 96.8°F

## 2012-01-05 DIAGNOSIS — C3410 Malignant neoplasm of upper lobe, unspecified bronchus or lung: Secondary | ICD-10-CM

## 2012-01-05 DIAGNOSIS — Z5111 Encounter for antineoplastic chemotherapy: Secondary | ICD-10-CM

## 2012-01-05 DIAGNOSIS — C3490 Malignant neoplasm of unspecified part of unspecified bronchus or lung: Secondary | ICD-10-CM

## 2012-01-05 MED ORDER — ONDANSETRON 16 MG/50ML IVPB (CHCC)
16.0000 mg | Freq: Once | INTRAVENOUS | Status: AC
  Administered 2012-01-05: 16 mg via INTRAVENOUS

## 2012-01-05 MED ORDER — PACLITAXEL CHEMO INJECTION 300 MG/50ML
175.0000 mg/m2 | Freq: Once | INTRAVENOUS | Status: AC
  Administered 2012-01-05: 318 mg via INTRAVENOUS
  Filled 2012-01-05: qty 53

## 2012-01-05 MED ORDER — FAMOTIDINE IN NACL 20-0.9 MG/50ML-% IV SOLN: 20.0000 mg | Freq: Once | INTRAVENOUS | Status: DC

## 2012-01-05 MED ORDER — SODIUM CHLORIDE 0.9 % IV SOLN
454.0000 mg | Freq: Once | INTRAVENOUS | Status: AC
  Administered 2012-01-05: 450 mg via INTRAVENOUS
  Filled 2012-01-05: qty 45

## 2012-01-05 MED ORDER — SODIUM CHLORIDE 0.9 % IV SOLN
Freq: Once | INTRAVENOUS | Status: AC
  Administered 2012-01-05: 10:00:00 via INTRAVENOUS

## 2012-01-05 MED ORDER — DIPHENHYDRAMINE HCL 50 MG/ML IJ SOLN
50.0000 mg | Freq: Once | INTRAMUSCULAR | Status: AC
Start: 2012-01-05 — End: 2012-01-05
  Administered 2012-01-05: 50 mg via INTRAVENOUS

## 2012-01-05 MED ORDER — SODIUM CHLORIDE 0.9 % IJ SOLN
10.0000 mL | INTRAMUSCULAR | Status: DC | PRN
  Filled 2012-01-05: qty 10

## 2012-01-05 MED ORDER — DEXAMETHASONE SODIUM PHOSPHATE 4 MG/ML IJ SOLN
20.0000 mg | Freq: Once | INTRAMUSCULAR | Status: AC
  Administered 2012-01-05: 20 mg via INTRAVENOUS

## 2012-01-05 MED ORDER — SODIUM CHLORIDE 0.9 % IV SOLN
15.0000 mg/kg | INTRAVENOUS | Status: DC
  Administered 2012-01-05: 1075 mg via INTRAVENOUS
  Filled 2012-01-05: qty 43

## 2012-01-05 MED ORDER — SODIUM CHLORIDE 0.9 % IJ SOLN
100.0000 ug | Freq: Once | INTRAVENOUS | Status: AC
  Administered 2012-01-05: 0.1 mg via INTRADERMAL
  Filled 2012-01-05: qty 0.01

## 2012-01-05 NOTE — Progress Notes (Signed)
Test dose given for carbo, no reaction after 30 minutes.  Infusion completed as planed.,   dmr

## 2012-01-05 NOTE — Patient Instructions (Addendum)
[facility] Discharge Instructions for Patients Receiving Chemotherapy  Today you received the following chemotherapy agents tazol/ carbplatin  To help prevent nausea and vomiting after your treatment, we encourage you to take your nausea med  and take it as often as prescribed for the next  If you develop nausea and vomiting that is not controlled by your nausea medication, call the clinic. If it is after clinic hours your family physician or the after hours number for the clinic or go to the Emergency Department.   BELOW ARE SYMPTOMS THAT SHOULD BE REPORTED IMMEDIATELY:  *FEVER GREATER THAN 100.5 F  *CHILLS WITH OR WITHOUT FEVER  NAUSEA AND VOMITING THAT IS NOT CONTROLLED WITH YOUR NAUSEA MEDICATION  *UNUSUAL SHORTNESS OF BREATH  *UNUSUAL BRUISING OR BLEEDING  TENDERNESS IN MOUTH AND THROAT WITH OR WITHOUT PRESENCE OF ULCERS  *URINARY PROBLEMS  *BOWEL PROBLEMS  UNUSUAL RASH Items with * indicate a potential emergency and should be followed up as soon as possible.  One of the nurses will contact you 24 hours after your treatment. Please let the nurse know about any problems that you may have experienced. Feel free to call the clinic you have any questions or concerns. The clinic phone number is [phone].   I have been informed and understand all the instructions given to me. I know to contact the clinic, my physician, or go to the Emergency Department if any problems should occur. I do not have any questions at this time, but understand that I may call the clinic during office hours   should I have any questions or need assistance in obtaining follow up care.    __________________________________________  _____________  __________ Signature of Patient or Authorized Representative            Date                   Time    __________________________________________ Nurse's Signature

## 2012-01-06 ENCOUNTER — Ambulatory Visit (HOSPITAL_BASED_OUTPATIENT_CLINIC_OR_DEPARTMENT_OTHER): Payer: Medicare Other

## 2012-01-06 ENCOUNTER — Encounter: Payer: Self-pay | Admitting: Radiation Oncology

## 2012-01-06 ENCOUNTER — Ambulatory Visit
Admission: RE | Admit: 2012-01-06 | Discharge: 2012-01-06 | Disposition: A | Discharge status: Home / Self Care | Payer: Medicare Other | Source: Ambulatory Visit | Attending: Radiation Oncology | Admitting: Radiation Oncology

## 2012-01-06 VITALS — BP 103/66 | HR 96 | Temp 97.1°F

## 2012-01-06 VITALS — BP 133/78 | HR 97 | Temp 97.7°F | Wt 151.2 lb

## 2012-01-06 DIAGNOSIS — C3490 Malignant neoplasm of unspecified part of unspecified bronchus or lung: Secondary | ICD-10-CM

## 2012-01-06 DIAGNOSIS — D709 Neutropenia, unspecified: Secondary | ICD-10-CM

## 2012-01-06 MED ORDER — FILGRASTIM 300 MCG/0.5ML IJ SOLN
300.0000 ug | Freq: Once | INTRAMUSCULAR | Status: AC
  Administered 2012-01-06: 300 ug via SUBCUTANEOUS
  Filled 2012-01-06: qty 0.5

## 2012-01-06 NOTE — Progress Notes (Signed)
Routine follow up post chest radiation completion 03/31/11. Shortness of breath on exertion.Dry cough but lungs are clear. Continues with systemic chemotherapy of paclitaxel, carboplatin and avastin for recurrent nsclca.

## 2012-01-06 NOTE — Progress Notes (Signed)
Radiation Oncology         [phone] ________________________________  Name: [name] MRN: [id number]  Date: 01/06/2012  DOB: [birth year]  Follow-Up Visit Note  CC: [name], MD  Ines Bloomer, MD  Diagnosis:   This is a 78 year old woman with stage IIIA adenocarcinoma of the right upper lobe of the lung  Interval Since Last Radiation:  10 months  Narrative:  The patient returns today for routine follow-up.  She is completing systemic chemotherapy and will be due for restaging CT on August 2. Interim since her last visit to our office, patient was noted to have progressive bilateral pulmonary nodules in March prompting salvage chemotherapy. In the interim CT on may 31st showed improvement in those nodules and she will likely proceed to maintenance chemotherapy after August 2 scan if it is an encouraging result. Of note, she had brain MRI on April 11 which showed no evidence of metastatic disease.      She denies any esophageal symptoms suggestive of stricture. She reports mild dyspnea with exertion.                        ALLERGIES:  is allergic to aleve and zyban.  Meds: Current Outpatient Prescriptions  Medication Sig Dispense Refill  . colesevelam (WELCHOL) 625 MG tablet Take 1,875 mg by mouth at bedtime.        . docusate sodium (COLACE) 100 MG capsule Take 100 mg by mouth 2 (two) times daily as needed.        . lidocaine-prilocaine (EMLA) cream       . LORazepam (ATIVAN) 1 MG tablet Taken prior to CT scans      . losartan-hydrochlorothiazide (HYZAAR) 50-12.5 MG per tablet Take 1 tablet by mouth daily.        [name] oxyCODONE-acetaminophen (PERCOCET) 5-325 MG per tablet Take by mouth as needed.       . polyethylene glycol (MIRALAX / GLYCOLAX) packet Take 17 g by mouth daily.      . prochlorperazine (COMPAZINE) 10 MG tablet Take 1 tablet (10 mg total) by mouth every 6 (six) hours as needed.  30 tablet  1  . Thiamine HCl (VITAMIN B-1 PO) Take by mouth daily.      .  traMADol (ULTRAM) 50 MG tablet Take 1 tablet (50 mg total) by mouth every 6 (six) hours as needed. Maximum dose= 8 tablets per day  60 tablet  0  . Vitamin D, Ergocalciferol, (DRISDOL) 50000 UNITS CAPS Take 50,000 Units by mouth every 7 (seven) days.      [name] aspirin 81 MG tablet Take 160 mg by mouth as needed.        . ciprofloxacin (CIPRO) 500 MG tablet        No current facility-administered medications for this encounter.   Facility-Administered Medications Ordered in Other Encounters  Medication Dose Route Frequency Provider Last Rate Last Dose  . 0.9 %  sodium chloride infusion   Intravenous Once Si Gaul, MD      . CARBOplatin (PARAPLATIN) 450 mg in sodium chloride 0.9 % 250 mL chemo infusion  450 mg Intravenous Once Si Gaul, MD   450 mg at 01/05/12 1429  . filgrastim (NEUPOGEN) injection 300 mcg  300 mcg Subcutaneous Once Si Gaul, MD   300 mcg at 01/06/12 1017  . PACLitaxel (TAXOL) 318 mg in dextrose 5 % 500 mL chemo infusion (> 80mg /m2)  175 mg/m2 (Treatment Plan Actual) Intravenous Once [facility]  Mohamed, MD   318 mg at 01/05/12 1133  . DISCONTD: bevacizumab (AVASTIN) 1,075 mg in sodium chloride 0.9 % 100 mL chemo infusion  15 mg/kg (Treatment Plan Actual) Intravenous Q21 days Si Gaul, MD   1,075 mg at 01/05/12 1055  . DISCONTD: famotidine (PEPCID) IVPB 20 mg  20 mg Intravenous Once Si Gaul, MD      . DISCONTD: sodium chloride 0.9 % injection 10 mL  10 mL Intracatheter PRN Si Gaul, MD        Physical Findings: The patient is in no acute distress. Patient is alert and oriented.  weight is 151 lb 3.2 oz (68.584 kg). Her temperature is 97.7 F (36.5 C). Her blood pressure is 133/78 and her pulse is 97. Her oxygen saturation is 100%. .  No significant changes.  Lab Findings: Lab Results  Component Value Date   WBC 3.4* 01/04/2012   HGB 11.7 01/04/2012   HCT 34.5* 01/04/2012   MCV 96.8 01/04/2012   PLT 104* 01/04/2012    Impression:  The  patient is currently clinically stable.  Plan:  The patient will return to radiation oncology clinic in the future on an as-needed basis. I would suggest consideration for periodic surveillance brain MRI given her histology of adenocarcinoma if her extracranial disease remains treated/controlled. She does have enlarging Schmorl's node of T9. Talked with her briefly about this today and encouraged her to contact Dr. Arbutus Ped or my office if she develops increasing mid back pain. Currently, she has no associated back pain.  _____________________________________  Artist Pais. Kathrynn Running, M.D.

## 2012-01-07 ENCOUNTER — Ambulatory Visit (HOSPITAL_BASED_OUTPATIENT_CLINIC_OR_DEPARTMENT_OTHER): Payer: Medicare Other

## 2012-01-07 VITALS — BP 124/79 | HR 94 | Temp 97.1°F

## 2012-01-07 DIAGNOSIS — C3490 Malignant neoplasm of unspecified part of unspecified bronchus or lung: Secondary | ICD-10-CM

## 2012-01-07 DIAGNOSIS — C3410 Malignant neoplasm of upper lobe, unspecified bronchus or lung: Secondary | ICD-10-CM

## 2012-01-07 DIAGNOSIS — D709 Neutropenia, unspecified: Secondary | ICD-10-CM

## 2012-01-07 MED ORDER — FILGRASTIM 300 MCG/0.5ML IJ SOLN
300.0000 ug | Freq: Once | INTRAMUSCULAR | Status: AC
  Administered 2012-01-07: 300 ug via SUBCUTANEOUS
  Filled 2012-01-07: qty 0.5

## 2012-01-08 ENCOUNTER — Ambulatory Visit (HOSPITAL_BASED_OUTPATIENT_CLINIC_OR_DEPARTMENT_OTHER): Payer: Medicare Other

## 2012-01-08 VITALS — BP 110/63 | HR 91 | Temp 97.7°F

## 2012-01-08 DIAGNOSIS — D709 Neutropenia, unspecified: Secondary | ICD-10-CM

## 2012-01-08 DIAGNOSIS — C3410 Malignant neoplasm of upper lobe, unspecified bronchus or lung: Secondary | ICD-10-CM

## 2012-01-08 DIAGNOSIS — C3490 Malignant neoplasm of unspecified part of unspecified bronchus or lung: Secondary | ICD-10-CM

## 2012-01-08 MED ORDER — FILGRASTIM 300 MCG/0.5ML IJ SOLN
300.0000 ug | Freq: Once | INTRAMUSCULAR | Status: AC
  Administered 2012-01-08: 300 ug via SUBCUTANEOUS

## 2012-01-10 ENCOUNTER — Ambulatory Visit (HOSPITAL_BASED_OUTPATIENT_CLINIC_OR_DEPARTMENT_OTHER): Payer: Medicare Other

## 2012-01-10 VITALS — BP 92/56 | HR 112 | Temp 96.9°F

## 2012-01-10 DIAGNOSIS — C3490 Malignant neoplasm of unspecified part of unspecified bronchus or lung: Secondary | ICD-10-CM

## 2012-01-10 DIAGNOSIS — D709 Neutropenia, unspecified: Secondary | ICD-10-CM

## 2012-01-10 MED ORDER — FILGRASTIM 300 MCG/0.5ML IJ SOLN
300.0000 ug | Freq: Once | INTRAMUSCULAR | Status: AC
  Administered 2012-01-10: 300 ug via SUBCUTANEOUS
  Filled 2012-01-10: qty 0.5

## 2012-01-11 ENCOUNTER — Ambulatory Visit: Payer: Medicare Other

## 2012-01-11 ENCOUNTER — Other Ambulatory Visit (HOSPITAL_BASED_OUTPATIENT_CLINIC_OR_DEPARTMENT_OTHER): Payer: Medicare Other

## 2012-01-11 DIAGNOSIS — C3410 Malignant neoplasm of upper lobe, unspecified bronchus or lung: Secondary | ICD-10-CM

## 2012-01-11 DIAGNOSIS — C3490 Malignant neoplasm of unspecified part of unspecified bronchus or lung: Secondary | ICD-10-CM

## 2012-01-11 LAB — COMPREHENSIVE METABOLIC PANEL
ALT: 15 U/L (ref 0–35)
Alkaline Phosphatase: 71 U/L (ref 39–117)
CO2: 24 mEq/L (ref 19–32)
Sodium: 134 mEq/L — ABNORMAL LOW (ref 135–145)
Total Bilirubin: 1.3 mg/dL — ABNORMAL HIGH (ref 0.3–1.2)
Total Protein: 6 g/dL (ref 6.0–8.3)

## 2012-01-11 LAB — CBC WITH DIFFERENTIAL/PLATELET
EOS%: 1.3 % (ref 0.0–7.0)
Eosinophils Absolute: 0.1 10*3/uL (ref 0.0–0.5)
MCV: 93.5 fL (ref 79.5–101.0)
MONO%: 6.9 % (ref 0.0–14.0)
NEUT#: 4.6 10*3/uL (ref 1.5–6.5)
RBC: 3.54 10*6/uL — ABNORMAL LOW (ref 3.70–5.45)
RDW: 16.1 % — ABNORMAL HIGH (ref 11.2–14.5)
nRBC: 0 % (ref 0–0)

## 2012-01-11 NOTE — Progress Notes (Signed)
Mrs Bierly here for possible injection.  Labs done  [facility]  5.4/4.6.  No need for any further Neupogen injections at this time per Dr Arbutus Ped.   Patient notified.

## 2012-01-18 ENCOUNTER — Other Ambulatory Visit (HOSPITAL_BASED_OUTPATIENT_CLINIC_OR_DEPARTMENT_OTHER): Payer: Medicare Other | Admitting: Lab

## 2012-01-18 DIAGNOSIS — C3490 Malignant neoplasm of unspecified part of unspecified bronchus or lung: Secondary | ICD-10-CM

## 2012-01-18 DIAGNOSIS — C3410 Malignant neoplasm of upper lobe, unspecified bronchus or lung: Secondary | ICD-10-CM

## 2012-01-18 LAB — CBC WITH DIFFERENTIAL/PLATELET
BASO%: 0.8 % (ref 0.0–2.0)
LYMPH%: 12.2 % — ABNORMAL LOW (ref 14.0–49.7)
MCHC: 33.8 g/dL (ref 31.5–36.0)
MCV: 99.2 fL (ref 79.5–101.0)
MONO%: 13.3 % (ref 0.0–14.0)
Platelets: 83 10*3/uL — ABNORMAL LOW (ref 145–400)
RBC: 3.19 10*6/uL — ABNORMAL LOW (ref 3.70–5.45)

## 2012-01-18 LAB — COMPREHENSIVE METABOLIC PANEL
ALT: 15 U/L (ref 0–35)
Alkaline Phosphatase: 54 U/L (ref 39–117)
Glucose, Bld: 115 mg/dL — ABNORMAL HIGH (ref 70–99)
Sodium: 142 mEq/L (ref 135–145)
Total Bilirubin: 0.6 mg/dL (ref 0.3–1.2)
Total Protein: 5.6 g/dL — ABNORMAL LOW (ref 6.0–8.3)

## 2012-01-21 ENCOUNTER — Ambulatory Visit (HOSPITAL_COMMUNITY)
Admission: RE | Admit: 2012-01-21 | Discharge: 2012-01-21 | Disposition: A | Discharge status: Home / Self Care | Payer: Medicare Other | Source: Ambulatory Visit | Attending: Physician Assistant | Admitting: Physician Assistant

## 2012-01-21 DIAGNOSIS — C3490 Malignant neoplasm of unspecified part of unspecified bronchus or lung: Secondary | ICD-10-CM | POA: Insufficient documentation

## 2012-01-21 DIAGNOSIS — R05 Cough: Secondary | ICD-10-CM | POA: Insufficient documentation

## 2012-01-21 DIAGNOSIS — N289 Disorder of kidney and ureter, unspecified: Secondary | ICD-10-CM | POA: Insufficient documentation

## 2012-01-21 DIAGNOSIS — K5730 Diverticulosis of large intestine without perforation or abscess without bleeding: Secondary | ICD-10-CM | POA: Insufficient documentation

## 2012-01-21 DIAGNOSIS — R059 Cough, unspecified: Secondary | ICD-10-CM | POA: Insufficient documentation

## 2012-01-21 DIAGNOSIS — Z923 Personal history of irradiation: Secondary | ICD-10-CM | POA: Insufficient documentation

## 2012-01-21 DIAGNOSIS — Z79899 Other long term (current) drug therapy: Secondary | ICD-10-CM | POA: Insufficient documentation

## 2012-01-21 DIAGNOSIS — Z9079 Acquired absence of other genital organ(s): Secondary | ICD-10-CM | POA: Insufficient documentation

## 2012-01-21 DIAGNOSIS — R918 Other nonspecific abnormal finding of lung field: Secondary | ICD-10-CM | POA: Insufficient documentation

## 2012-01-21 DIAGNOSIS — I708 Atherosclerosis of other arteries: Secondary | ICD-10-CM | POA: Insufficient documentation

## 2012-01-21 DIAGNOSIS — I700 Atherosclerosis of aorta: Secondary | ICD-10-CM | POA: Insufficient documentation

## 2012-01-21 DIAGNOSIS — J90 Pleural effusion, not elsewhere classified: Secondary | ICD-10-CM | POA: Insufficient documentation

## 2012-01-21 DIAGNOSIS — Z90710 Acquired absence of both cervix and uterus: Secondary | ICD-10-CM | POA: Insufficient documentation

## 2012-01-21 DIAGNOSIS — K8020 Calculus of gallbladder without cholecystitis without obstruction: Secondary | ICD-10-CM | POA: Insufficient documentation

## 2012-01-21 DIAGNOSIS — R0602 Shortness of breath: Secondary | ICD-10-CM | POA: Insufficient documentation

## 2012-01-21 DIAGNOSIS — I2510 Atherosclerotic heart disease of native coronary artery without angina pectoris: Secondary | ICD-10-CM | POA: Insufficient documentation

## 2012-01-21 MED ORDER — IOHEXOL 300 MG/ML  SOLN
100.0000 mL | Freq: Once | INTRAMUSCULAR | Status: AC | PRN
  Administered 2012-01-21: 100 mL via INTRAVENOUS

## 2012-01-25 ENCOUNTER — Ambulatory Visit (HOSPITAL_BASED_OUTPATIENT_CLINIC_OR_DEPARTMENT_OTHER): Payer: Medicare Other

## 2012-01-25 ENCOUNTER — Telehealth: Payer: Self-pay | Admitting: *Deleted

## 2012-01-25 ENCOUNTER — Ambulatory Visit (HOSPITAL_BASED_OUTPATIENT_CLINIC_OR_DEPARTMENT_OTHER): Payer: Medicare Other | Admitting: Internal Medicine

## 2012-01-25 ENCOUNTER — Other Ambulatory Visit (HOSPITAL_BASED_OUTPATIENT_CLINIC_OR_DEPARTMENT_OTHER): Payer: Medicare Other | Admitting: Lab

## 2012-01-25 VITALS — BP 150/83 | HR 83 | Temp 97.1°F | Resp 18 | Ht 64.5 in | Wt 149.6 lb

## 2012-01-25 VITALS — BP 163/89 | HR 72

## 2012-01-25 DIAGNOSIS — Z5112 Encounter for antineoplastic immunotherapy: Secondary | ICD-10-CM

## 2012-01-25 DIAGNOSIS — C3410 Malignant neoplasm of upper lobe, unspecified bronchus or lung: Secondary | ICD-10-CM

## 2012-01-25 DIAGNOSIS — C3490 Malignant neoplasm of unspecified part of unspecified bronchus or lung: Secondary | ICD-10-CM

## 2012-01-25 DIAGNOSIS — Z452 Encounter for adjustment and management of vascular access device: Secondary | ICD-10-CM

## 2012-01-25 LAB — COMPREHENSIVE METABOLIC PANEL
ALT: 14 U/L (ref 0–35)
BUN: 12 mg/dL (ref 6–23)
CO2: 23 mEq/L (ref 19–32)
Calcium: 9.3 mg/dL (ref 8.4–10.5)
Creatinine, Ser: 0.62 mg/dL (ref 0.50–1.10)
Glucose, Bld: 99 mg/dL (ref 70–99)
Total Bilirubin: 0.5 mg/dL (ref 0.3–1.2)

## 2012-01-25 LAB — CBC WITH DIFFERENTIAL/PLATELET
Eosinophils Absolute: 0 10*3/uL (ref 0.0–0.5)
HCT: 32.5 % — ABNORMAL LOW (ref 34.8–46.6)
LYMPH%: 5.6 % — ABNORMAL LOW (ref 14.0–49.7)
MCV: 95.9 fL (ref 79.5–101.0)
MONO%: 8 % (ref 0.0–14.0)
NEUT#: 4.7 10*3/uL (ref 1.5–6.5)
NEUT%: 86.2 % — ABNORMAL HIGH (ref 38.4–76.8)
Platelets: 134 10*3/uL — ABNORMAL LOW (ref 145–400)
RBC: 3.39 10*6/uL — ABNORMAL LOW (ref 3.70–5.45)
nRBC: 0 % (ref 0–0)

## 2012-01-25 LAB — UA PROTEIN, DIPSTICK - CHCC: Protein, ur: NEGATIVE mg/dL

## 2012-01-25 MED ORDER — HEPARIN SOD (PORK) LOCK FLUSH 100 UNIT/ML IV SOLN
500.0000 [IU] | Freq: Once | INTRAVENOUS | Status: AC | PRN
  Administered 2012-01-25: 500 [IU]
  Filled 2012-01-25: qty 5

## 2012-01-25 MED ORDER — ALTEPLASE 2 MG IJ SOLR
2.0000 mg | Freq: Once | INTRAMUSCULAR | Status: AC | PRN
  Administered 2012-01-25: 2 mg
  Filled 2012-01-25: qty 2

## 2012-01-25 MED ORDER — SODIUM CHLORIDE 0.9 % IJ SOLN
10.0000 mL | INTRAMUSCULAR | Status: DC | PRN
  Administered 2012-01-25: 10 mL
  Filled 2012-01-25: qty 10

## 2012-01-25 MED ORDER — SODIUM CHLORIDE 0.9 % IV SOLN
Freq: Once | INTRAVENOUS | Status: AC
  Administered 2012-01-25: 14:00:00 via INTRAVENOUS

## 2012-01-25 MED ORDER — BEVACIZUMAB CHEMO INJECTION 400 MG/16ML
15.0000 mg/kg | Freq: Once | INTRAVENOUS | Status: AC
  Administered 2012-01-25: 1025 mg via INTRAVENOUS
  Filled 2012-01-25: qty 41

## 2012-01-25 NOTE — Progress Notes (Signed)
1255 Port accessed no blood return noted.  1300 Cath Flo administered  1335 Positive blood return noted.

## 2012-01-25 NOTE — Progress Notes (Signed)
[facility] [phone]   [phone]  OFFICE PROGRESS NOTE  [name], MD [address] [facility], [place]. [place] [address]  DIAGNOSIS: Recurrent non-small cell lung cancer initially diagnosed as stage IIIA in (T1b N2 M0) in July 2012.  PRIOR THERAPY:  #1 Status post concurrent chemoradiation with weekly carboplatin and paclitaxel, last dose was given 03/01/2011.  #2 status post 3 seconds of consolidation chemotherapy was carboplatin for AUC of 5 and Alimta 500 mg/M2 giving him to see weeks, last dose was given 05/27/2011. # 3 systemic chemotherapy with carboplatin for AUC of 5, paclitaxel 175 mg/M2 and Avastin 15 mg/kg every 3 weeks. Status post 6 cycles   CURRENT THERAPY: The patient will start today the first cycle of maintenance Avastin 15 mg/kg every 3 weeks  INTERVAL HISTORY: [name] 78 y.o. female returns to the clinic today for followup visit accompanied by her husband and daughter. The patient is feeling fine today and tolerated the last cycle of her systemic chemotherapy was carboplatin, paclitaxel and Avastin fairly well. She denied having any significant nausea or vomiting, no fever or chills. She has no significant peripheral neuropathy. No significant weight loss or night sweats. She denied having any chest pain, shortness breath, cough or hemoptysis. The patient has repeat CT scan of the chest, abdomen and pelvis performed recently and she is here today for evaluation and discussion of her scan results.  MEDICAL HISTORY: Past Medical History  Diagnosis Date  . Lung mass   . History of radiation therapy 7/31/212-03/08/2011    right upper lobe lung adenocarcinoma  . COPD (chronic obstructive pulmonary disease)   . Hypertension   . Hypercholesterolemia   . Osteoporosis   . History of chemotherapy     carboplatin/paclitaxel  . Cancer July 2012    , right upper lobe    ALLERGIES:  is allergic to  aleve and zyban.  MEDICATIONS:  Current Outpatient Prescriptions  Medication Sig Dispense Refill  . aspirin 81 MG tablet Take 160 mg by mouth as needed.        . colesevelam (WELCHOL) 625 MG tablet Take 1,875 mg by mouth at bedtime.        [name] dexamethasone (DECADRON) 4 MG tablet Take 4 mg by mouth 2 (two) times daily with a meal. Take 1 bid the day before , the day of and the day after chemo      . docusate sodium (COLACE) 100 MG capsule Take 100 mg by mouth 2 (two) times daily as needed.        . lidocaine-prilocaine (EMLA) cream       . LORazepam (ATIVAN) 1 MG tablet Taken prior to CT scans      . polyethylene glycol (MIRALAX / GLYCOLAX) packet Take 17 g by mouth daily.      . prochlorperazine (COMPAZINE) 10 MG tablet Take 1 tablet (10 mg total) by mouth every 6 (six) hours as needed.  30 tablet  1  . Thiamine HCl (VITAMIN B-1 PO) Take by mouth daily.      . traMADol (ULTRAM) 50 MG tablet Take 1 tablet (50 mg total) by mouth every 6 (six) hours as needed. Maximum dose= 8 tablets per day  60 tablet  0  . Vitamin D, Ergocalciferol, (DRISDOL) 50000 UNITS CAPS Take 50,000 Units by mouth every 7 (seven) days.      [name] losartan-hydrochlorothiazide (HYZAAR) 50-12.5 MG per tablet Take 1 tablet by mouth daily.        [name]  oxyCODONE-acetaminophen (PERCOCET) 5-325 MG per tablet Take by mouth as needed.         SURGICAL HISTORY:  Past Surgical History  Procedure Date  . Abdominal hysterectomy     s/p for fibroid tumor  . Portacath placement     right ij     REVIEW OF SYSTEMS:  A comprehensive review of systems was negative.   PHYSICAL EXAMINATION: General appearance: alert, cooperative and no distress Head: Normocephalic, without obvious abnormality, atraumatic Neck: no adenopathy Lymph nodes: Cervical, supraclavicular, and axillary nodes normal. Resp: clear to auscultation bilaterally Back: symmetric, no curvature. ROM normal. No CVA tenderness. Cardio: regular rate and rhythm, S1, S2 normal,  no murmur, click, rub or gallop GI: soft, non-tender; bowel sounds normal; no masses,  no organomegaly Extremities: extremities normal, atraumatic, no cyanosis or edema Neurologic: Alert and oriented X 3, normal strength and tone. Normal symmetric reflexes. Normal coordination and gait  ECOG PERFORMANCE STATUS: 1 - Symptomatic but completely ambulatory  Blood pressure 150/83, pulse 83, temperature 97.1 F (36.2 C), temperature source Oral, resp. rate 18, height 5' 4.5" (1.638 m), weight 149 lb 9.6 oz (67.858 kg).  LABORATORY DATA: Lab Results  Component Value Date   WBC 5.5 01/25/2012   HGB 11.1* 01/25/2012   HCT 32.5* 01/25/2012   MCV 95.9 01/25/2012   PLT 134* 01/25/2012      Chemistry      Component Value Date/Time   NA 142 01/18/2012 1033   NA 142 09/16/2011 0917   K 4.3 01/18/2012 1033   K 3.7 09/16/2011 0917   CL 110 01/18/2012 1033   CL 106 09/16/2011 0917   CO2 23 01/18/2012 1033   CO2 29 09/16/2011 0917   BUN 12 01/18/2012 1033   BUN 16 09/16/2011 0917   CREATININE 0.70 01/18/2012 1033   CREATININE 0.8 09/16/2011 0917      Component Value Date/Time   CALCIUM 8.7 01/18/2012 1033   CALCIUM 8.3 09/16/2011 0917   ALKPHOS 54 01/18/2012 1033   ALKPHOS 62 09/16/2011 0917   AST 15 01/18/2012 1033   AST 18 09/16/2011 0917   ALT 15 01/18/2012 1033   BILITOT 0.6 01/18/2012 1033   BILITOT 0.60 09/16/2011 0917       RADIOGRAPHIC STUDIES: Ct Chest W Contrast  01/21/2012  *RADIOLOGY REPORT*  Clinical Data:  History of lung cancer diagnosed in July 2012. Chemotherapy in progress.  Radiation therapy complete.  Cough and shortness of breath.  CT CHEST, ABDOMEN AND PELVIS WITH CONTRAST  Technique:  Multidetector CT imaging of the chest, abdomen and pelvis was performed following the standard protocol during bolus administration of intravenous contrast.  Contrast: OMNIPAQUE IOHEXOL 300 MG/ML  SOLN  Comparison:  CT of the chest abdomen and pelvis 11/19/2011.   CT CHEST  Findings:  Mediastinum: Heart  size is normal. There is no significant pericardial fluid, thickening or pericardial calcification. There is atherosclerosis of the thoracic aorta, the great vessels of the mediastinum and the coronary arteries, including calcified atherosclerotic plaque in the left anterior descending coronary arteries. No pathologically enlarged mediastinal or hilar lymph nodes. Esophagus is unremarkable in appearance.  Right internal jugular single lumen Port-A-Cath with tip terminating in the distal superior vena cava.  Lungs/Pleura: Compared to prior examinations numerous tiny pulmonary nodules scattered throughout the lungs bilaterally appear unchanged in size, number and distribution.  Specific examples include the following: 4 mm right lower lobe subpleural nodule (image 44 of series 5) is unchanged, and a 4 mm subpleural  nodule in right lower lobe (image 42 of series 5) is unchanged. No other definite new or enlarging suspicious appearing pulmonary nodules or masses are otherwise identified.  Postradiation changes in the right perihilar region and medial aspect of the right lower lobe are again noted.  Small right-sided pleural effusion layering dependently is similar to the prior examination.  Musculoskeletal: There are no aggressive appearing lytic or blastic lesions noted in the visualized portions of the skeleton.  IMPRESSION:  1.  Postradiation changes in the right hemithorax again noted, including evolving postradiation changes in the lungs and chronic small right-sided pleural effusion layering dependently.  Multiple tiny pulmonary nodules are unchanged compared to the prior examination.  No new nodules are identified on today's study. 2. Atherosclerosis, including left anterior descending coronary artery disease. 3.  Additional incidental findings, as above.   CT ABDOMEN AND PELVIS  Findings:  Abdomen/Pelvis: Numerous small gallstones are noted within the gallbladder.  No signs to suggest acute cholecystitis at  this time. The appearance of the liver, pancreas, spleen and bilateral adrenal glands are unremarkable to.  A tiny 2-3 mm low attenuation lesions are noted in the kidneys bilaterally, too small to definitively characterize (statistically likely to represent cysts), but similar to priors.  There is extensive atherosclerosis of the abdominal and pelvic vasculature, without definite aneurysm or dissection.  There is a likely high-grade stenosis of the right common iliac artery, but there is flow in the right external and internal iliac arteries (which could be because of incomplete occlusion or collateral flow).  Postoperative changes of bifemoral bypass graft is noted (the graft is grossly patent).  No ascites or pneumoperitoneum and no pathologic distension of bowel.  No definite pathologic lymphadenopathy identified within the abdomen or pelvis.  There is several colonic diverticula noted, most pronounced in the region of the sigmoid colon, without surrounding inflammatory changes to suggest acute diverticulitis at this time.  Status post total abdominal hysterectomy and bilateral salpingo-oophorectomy. Urinary bladder is unremarkable in appearance.  Musculoskeletal: There are no aggressive appearing lytic or blastic lesions noted in the visualized portions of the skeleton.  The small round lesion in the inferior endplate of T9 with a well- defined rim of sclerosis is unchanged, most compatible with a Schmorl's node.  IMPRESSION:  1.  No evidence to suggest metastatic disease to the abdomen or pelvis. 2.  Mild colonic diverticulosis without findings to suggest acute diverticulitis. 3.  Extensive atherosclerosis, including high-grade stenosis of the right common iliac artery (unchanged).  The patient is status post fem-fem bypass graft. 4.  Cholelithiasis without findings to suggest acute cholecystitis. 5.  Additional incidental findings, as above.  Original Report Authenticated By: Florencia Reasons, M.D.      ASSESSMENT: This is a very pleasant 78 years old white female with recurrent non-small cell lung cancer most recently treated with 6 cycles of systemic chemotherapy with carboplatin, paclitaxel and Avastin. The patient has no evidence for disease progression on his recent scan.  PLAN: I discussed the scan results with the patient and her family. I gave her the option between observation versus proceeding with maintenance therapy with Avastin. The patient is interested in proceeding with treatment. She would be treated with maintenance Avastin 15 mg/kg every 3 weeks. She will start the first cycle of her treatment today. The patient would come back for followup visit in 3 weeks with the start of cycle #2. She was advised to call me immediately if she has any concerning symptoms in the  interval.  All questions were answered. The patient knows to call the clinic with any problems, questions or concerns. We can certainly see the patient much sooner if necessary.  I spent 15 minutes counseling the patient face to face. The total time spent in the appointment was 25 minutes.

## 2012-01-25 NOTE — Telephone Encounter (Signed)
Gave patient appointment 02-15-2012 and 03-07-2012 printed out treatment and gave to [facility] to set up patient chemo times

## 2012-01-25 NOTE — Patient Instructions (Signed)
[facility] Discharge Instructions for Patients Receiving Chemotherapy  Today you received the following chemotherapy agents Avastin To help prevent nausea and vomiting after your treatment, we encourage you to take your nausea medication as prescribed.  If you develop nausea and vomiting that is not controlled by your nausea medication, call the clinic. If it is after clinic hours your family physician or the after hours number for the clinic or go to the Emergency Department.   BELOW ARE SYMPTOMS THAT SHOULD BE REPORTED IMMEDIATELY:  *FEVER GREATER THAN 100.5 F  *CHILLS WITH OR WITHOUT FEVER  NAUSEA AND VOMITING THAT IS NOT CONTROLLED WITH YOUR NAUSEA MEDICATION  *UNUSUAL SHORTNESS OF BREATH  *UNUSUAL BRUISING OR BLEEDING  TENDERNESS IN MOUTH AND THROAT WITH OR WITHOUT PRESENCE OF ULCERS  *URINARY PROBLEMS  *BOWEL PROBLEMS  UNUSUAL RASH Items with * indicate a potential emergency and should be followed up as soon as possible.  One of the nurses will contact you 24 hours after your treatment. Please let the nurse know about any problems that you may have experienced. Feel free to call the clinic you have any questions or concerns. The clinic phone number is [phone].   I have been informed and understand all the instructions given to me. I know to contact the clinic, my physician, or go to the Emergency Department if any problems should occur. I do not have any questions at this time, but understand that I may call the clinic during office hours   should I have any questions or need assistance in obtaining follow up care.    __________________________________________  _____________  __________ Signature of Patient or Authorized Representative            Date                   Time    __________________________________________ Nurse's Signature    

## 2012-01-31 ENCOUNTER — Telehealth: Payer: Self-pay

## 2012-01-31 NOTE — Telephone Encounter (Signed)
Per POF I have scheduled appts. TMB

## 2012-02-15 ENCOUNTER — Ambulatory Visit (HOSPITAL_BASED_OUTPATIENT_CLINIC_OR_DEPARTMENT_OTHER): Payer: Medicare Other | Admitting: Physician Assistant

## 2012-02-15 ENCOUNTER — Ambulatory Visit (HOSPITAL_BASED_OUTPATIENT_CLINIC_OR_DEPARTMENT_OTHER): Payer: Medicare Other

## 2012-02-15 ENCOUNTER — Encounter: Payer: Self-pay | Admitting: Physician Assistant

## 2012-02-15 ENCOUNTER — Telehealth: Payer: Self-pay | Admitting: Internal Medicine

## 2012-02-15 ENCOUNTER — Other Ambulatory Visit (HOSPITAL_BASED_OUTPATIENT_CLINIC_OR_DEPARTMENT_OTHER): Payer: Medicare Other | Admitting: Lab

## 2012-02-15 VITALS — BP 120/75 | HR 101 | Temp 97.0°F | Resp 18 | Ht 64.5 in | Wt 147.5 lb

## 2012-02-15 DIAGNOSIS — C3410 Malignant neoplasm of upper lobe, unspecified bronchus or lung: Secondary | ICD-10-CM

## 2012-02-15 DIAGNOSIS — C3490 Malignant neoplasm of unspecified part of unspecified bronchus or lung: Secondary | ICD-10-CM

## 2012-02-15 DIAGNOSIS — Z5112 Encounter for antineoplastic immunotherapy: Secondary | ICD-10-CM

## 2012-02-15 LAB — CBC WITH DIFFERENTIAL/PLATELET
Basophils Absolute: 0 10*3/uL (ref 0.0–0.1)
Eosinophils Absolute: 0.3 10*3/uL (ref 0.0–0.5)
HCT: 37.3 % (ref 34.8–46.6)
HGB: 12.7 g/dL (ref 11.6–15.9)
MONO#: 0.5 10*3/uL (ref 0.1–0.9)
NEUT#: 3.8 10*3/uL (ref 1.5–6.5)
NEUT%: 73.7 % (ref 38.4–76.8)
RDW: 14.9 % — ABNORMAL HIGH (ref 11.2–14.5)
WBC: 5.1 10*3/uL (ref 3.9–10.3)
lymph#: 0.6 10*3/uL — ABNORMAL LOW (ref 0.9–3.3)

## 2012-02-15 LAB — COMPREHENSIVE METABOLIC PANEL (CC13)
ALT: 16 U/L (ref 0–55)
AST: 16 U/L (ref 5–34)
Albumin: 3.6 g/dL (ref 3.5–5.0)
Alkaline Phosphatase: 66 U/L (ref 40–150)
BUN: 16 mg/dL (ref 7.0–26.0)
Calcium: 9.5 mg/dL (ref 8.4–10.4)
Chloride: 104 mEq/L (ref 98–107)
Glucose: 82 mg/dl (ref 70–99)
Potassium: 3.6 mEq/L (ref 3.5–5.1)
Sodium: 139 mEq/L (ref 136–145)

## 2012-02-15 LAB — UA PROTEIN, DIPSTICK - CHCC: Protein, ur: NEGATIVE mg/dL

## 2012-02-15 MED ORDER — SODIUM CHLORIDE 0.9 % IJ SOLN
10.0000 mL | INTRAMUSCULAR | Status: DC | PRN
  Administered 2012-02-15: 10 mL
  Filled 2012-02-15: qty 10

## 2012-02-15 MED ORDER — HEPARIN SOD (PORK) LOCK FLUSH 100 UNIT/ML IV SOLN
500.0000 [IU] | Freq: Once | INTRAVENOUS | Status: AC | PRN
  Administered 2012-02-15: 500 [IU]
  Filled 2012-02-15: qty 5

## 2012-02-15 MED ORDER — SODIUM CHLORIDE 0.9 % IV SOLN
Freq: Once | INTRAVENOUS | Status: AC
  Administered 2012-02-15: 12:00:00 via INTRAVENOUS

## 2012-02-15 MED ORDER — SODIUM CHLORIDE 0.9 % IV SOLN
15.0000 mg/kg | Freq: Once | INTRAVENOUS | Status: AC
  Administered 2012-02-15: 1025 mg via INTRAVENOUS
  Filled 2012-02-15: qty 41

## 2012-02-15 NOTE — Patient Instructions (Addendum)
Schedule appointment with Dermatologist for Shave biopsies one week before your next maintenance Avastin treatment. Follow up in 3 weeks prior to cycle #3 of your maintenance Avastin therapy

## 2012-02-15 NOTE — Telephone Encounter (Signed)
appts  Had been made,ck and re printed for pt aom

## 2012-02-15 NOTE — Patient Instructions (Signed)
[facility] Discharge Instructions for Patients Receiving Chemotherapy  Today you received the following chemotherapy agents Avastin To help prevent nausea and vomiting after your treatment, we encourage you to take your nausea medication as prescribed.  If you develop nausea and vomiting that is not controlled by your nausea medication, call the clinic. If it is after clinic hours your family physician or the after hours number for the clinic or go to the Emergency Department.   BELOW ARE SYMPTOMS THAT SHOULD BE REPORTED IMMEDIATELY:  *FEVER GREATER THAN 100.5 F  *CHILLS WITH OR WITHOUT FEVER  NAUSEA AND VOMITING THAT IS NOT CONTROLLED WITH YOUR NAUSEA MEDICATION  *UNUSUAL SHORTNESS OF BREATH  *UNUSUAL BRUISING OR BLEEDING  TENDERNESS IN MOUTH AND THROAT WITH OR WITHOUT PRESENCE OF ULCERS  *URINARY PROBLEMS  *BOWEL PROBLEMS  UNUSUAL RASH Items with * indicate a potential emergency and should be followed up as soon as possible.  One of the nurses will contact you 24 hours after your treatment. Please let the nurse know about any problems that you may have experienced. Feel free to call the clinic you have any questions or concerns. The clinic phone number is [phone].   I have been informed and understand all the instructions given to me. I know to contact the clinic, my physician, or go to the Emergency Department if any problems should occur. I do not have any questions at this time, but understand that I may call the clinic during office hours   should I have any questions or need assistance in obtaining follow up care.    __________________________________________  _____________  __________ Signature of Patient or Authorized Representative            Date                   Time    __________________________________________ Nurse's Signature    

## 2012-02-17 ENCOUNTER — Telehealth: Payer: Self-pay | Admitting: Medical Oncology

## 2012-02-17 NOTE — Telephone Encounter (Signed)
VM re pain in left lower mouth where she is missing 2 teeth . She does not see any sore or thrush. She said in the past she has had a sore mouth that got better , but this feels different. She denies fever . I told her to call her dentist and I will talk to Adrena.

## 2012-02-18 NOTE — Progress Notes (Signed)
[facility] [phone]   [phone]) [phone]  OFFICE PROGRESS NOTE  [name], MD [address] [facility] [place]. [place] [address]  DIAGNOSIS: Recurrent non-small cell lung cancer initially diagnosed as stage IIIA in (T1b N2 M0) in July 2012.  PRIOR THERAPY:  #1 Status post concurrent chemoradiation with weekly carboplatin and paclitaxel, last dose was given 03/01/2011.  #2 status post 3 seconds of consolidation chemotherapy was carboplatin for AUC of 5 and Alimta 500 mg/M2 giving him to see weeks, last dose was given 05/27/2011. # 3 systemic chemotherapy with carboplatin for AUC of 5, paclitaxel 175 mg/M2 and Avastin 15 mg/kg every 3 weeks. Status post 6 cycles   CURRENT THERAPY: maintenance Avastin 15 mg/kg every 3 weeks, status post 1 cycle  INTERVAL HISTORY: [name] 78 y.o. female returns to the clinic today for followup visit. She tolerated her first cycle of maintenance Avastin without difficulty. She reports feeling "tired and washed out" on some days but overall is feeling well. The patient is feeling fine today. She denied having any significant nausea or vomiting, no fever or chills. She has no significant peripheral neuropathy. No significant weight loss or night sweats. She denied having any chest pain, shortness breath, cough or hemoptysis. She will need some shave biopsies done through her dermatologist office and is wondering when the best time to schedule this procedure would be.  MEDICAL HISTORY: Past Medical History  Diagnosis Date  . Lung mass   . History of radiation therapy 7/31/212-03/08/2011    right upper lobe lung adenocarcinoma  . COPD (chronic obstructive pulmonary disease)   . Hypertension   . Hypercholesterolemia   . Osteoporosis   . History of chemotherapy     carboplatin/paclitaxel  . Cancer July 2012    , right upper lobe    ALLERGIES:  is allergic to aleve and zyban.  MEDICATIONS:   Current Outpatient Prescriptions  Medication Sig Dispense Refill  . aspirin 81 MG tablet Take 160 mg by mouth as needed.        . colesevelam (WELCHOL) 625 MG tablet Take 1,875 mg by mouth at bedtime.        [name] dexamethasone (DECADRON) 4 MG tablet Take 4 mg by mouth 2 (two) times daily with a meal. Take 1 bid the day before , the day of and the day after chemo      . docusate sodium (COLACE) 100 MG capsule Take 100 mg by mouth 2 (two) times daily as needed.        . lidocaine-prilocaine (EMLA) cream       . LORazepam (ATIVAN) 1 MG tablet Taken prior to CT scans      . losartan-hydrochlorothiazide (HYZAAR) 50-12.5 MG per tablet Take 1 tablet by mouth daily.        [name] oxyCODONE-acetaminophen (PERCOCET) 5-325 MG per tablet Take by mouth as needed.       . polyethylene glycol (MIRALAX / GLYCOLAX) packet Take 17 g by mouth daily.      . prochlorperazine (COMPAZINE) 10 MG tablet Take 1 tablet (10 mg total) by mouth every 6 (six) hours as needed.  30 tablet  1  . Thiamine HCl (VITAMIN B-1 PO) Take by mouth daily.      . traMADol (ULTRAM) 50 MG tablet Take 1 tablet (50 mg total) by mouth every 6 (six) hours as needed. Maximum dose= 8 tablets per day  60 tablet  0  . Vitamin  D, Ergocalciferol, (DRISDOL) 50000 UNITS CAPS Take 50,000 Units by mouth every 7 (seven) days.        SURGICAL HISTORY:  Past Surgical History  Procedure Date  . Abdominal hysterectomy     s/p for fibroid tumor  . Portacath placement     right ij     REVIEW OF SYSTEMS:  A comprehensive review of systems was negative except for: Constitutional: positive for malaise   PHYSICAL EXAMINATION: General appearance: alert, cooperative and no distress Head: Normocephalic, without obvious abnormality, atraumatic Neck: no adenopathy Lymph nodes: Cervical, supraclavicular, and axillary nodes normal. Resp: clear to auscultation bilaterally Back: symmetric, no curvature. ROM normal. No CVA tenderness. Cardio: regular rate and rhythm,  S1, S2 normal, no murmur, click, rub or gallop GI: soft, non-tender; bowel sounds normal; no masses,  no organomegaly Extremities: extremities normal, atraumatic, no cyanosis or edema Neurologic: Alert and oriented X 3, normal strength and tone. Normal symmetric reflexes. Normal coordination and gait  ECOG PERFORMANCE STATUS: 1 - Symptomatic but completely ambulatory  Blood pressure 120/75, pulse 101, temperature 97 F (36.1 C), temperature source Oral, resp. rate 18, height 5' 4.5" (1.638 m), weight 147 lb 8 oz (66.906 kg).  LABORATORY DATA: Lab Results  Component Value Date   WBC 5.1 02/15/2012   HGB 12.7 02/15/2012   HCT 37.3 02/15/2012   MCV 94.7 02/15/2012   PLT 177 02/15/2012      Chemistry      Component Value Date/Time   NA 139 02/15/2012 1023   NA 136 01/25/2012 1137   NA 142 09/16/2011 0917   K 3.6 02/15/2012 1023   K 4.1 01/25/2012 1137   K 3.7 09/16/2011 0917   CL 104 02/15/2012 1023   CL 109 01/25/2012 1137   CL 106 09/16/2011 0917   CO2 25 02/15/2012 1023   CO2 23 01/25/2012 1137   CO2 29 09/16/2011 0917   BUN 16.0 02/15/2012 1023   BUN 12 01/25/2012 1137   BUN 16 09/16/2011 0917   CREATININE 0.8 02/15/2012 1023   CREATININE 0.62 01/25/2012 1137   CREATININE 0.8 09/16/2011 0917      Component Value Date/Time   CALCIUM 9.5 02/15/2012 1023   CALCIUM 9.3 01/25/2012 1137   CALCIUM 8.3 09/16/2011 0917   ALKPHOS 66 02/15/2012 1023   ALKPHOS 51 01/25/2012 1137   ALKPHOS 62 09/16/2011 0917   AST 16 02/15/2012 1023   AST 13 01/25/2012 1137   AST 18 09/16/2011 0917   ALT 16 02/15/2012 1023   ALT 14 01/25/2012 1137   BILITOT 0.80 02/15/2012 1023   BILITOT 0.5 01/25/2012 1137   BILITOT 0.60 09/16/2011 0917       RADIOGRAPHIC STUDIES: Ct Chest W Contrast  01/21/2012  *RADIOLOGY REPORT*  Clinical Data:  History of lung cancer diagnosed in July 2012. Chemotherapy in progress.  Radiation therapy complete.  Cough and shortness of breath.  CT CHEST, ABDOMEN AND PELVIS WITH CONTRAST  Technique:   Multidetector CT imaging of the chest, abdomen and pelvis was performed following the standard protocol during bolus administration of intravenous contrast.  Contrast: OMNIPAQUE IOHEXOL 300 MG/ML  SOLN  Comparison:  CT of the chest abdomen and pelvis 11/19/2011.   CT CHEST  Findings:  Mediastinum: Heart size is normal. There is no significant pericardial fluid, thickening or pericardial calcification. There is atherosclerosis of the thoracic aorta, the great vessels of the mediastinum and the coronary arteries, including calcified atherosclerotic plaque in the left anterior descending coronary arteries. No  pathologically enlarged mediastinal or hilar lymph nodes. Esophagus is unremarkable in appearance.  Right internal jugular single lumen Port-A-Cath with tip terminating in the distal superior vena cava.  Lungs/Pleura: Compared to prior examinations numerous tiny pulmonary nodules scattered throughout the lungs bilaterally appear unchanged in size, number and distribution.  Specific examples include the following: 4 mm right lower lobe subpleural nodule (image 44 of series 5) is unchanged, and a 4 mm subpleural nodule in right lower lobe (image 42 of series 5) is unchanged. No other definite new or enlarging suspicious appearing pulmonary nodules or masses are otherwise identified.  Postradiation changes in the right perihilar region and medial aspect of the right lower lobe are again noted.  Small right-sided pleural effusion layering dependently is similar to the prior examination.  Musculoskeletal: There are no aggressive appearing lytic or blastic lesions noted in the visualized portions of the skeleton.  IMPRESSION:  1.  Postradiation changes in the right hemithorax again noted, including evolving postradiation changes in the lungs and chronic small right-sided pleural effusion layering dependently.  Multiple tiny pulmonary nodules are unchanged compared to the prior examination.  No new nodules are  identified on today's study. 2. Atherosclerosis, including left anterior descending coronary artery disease. 3.  Additional incidental findings, as above.   CT ABDOMEN AND PELVIS  Findings:  Abdomen/Pelvis: Numerous small gallstones are noted within the gallbladder.  No signs to suggest acute cholecystitis at this time. The appearance of the liver, pancreas, spleen and bilateral adrenal glands are unremarkable to.  A tiny 2-3 mm low attenuation lesions are noted in the kidneys bilaterally, too small to definitively characterize (statistically likely to represent cysts), but similar to priors.  There is extensive atherosclerosis of the abdominal and pelvic vasculature, without definite aneurysm or dissection.  There is a likely high-grade stenosis of the right common iliac artery, but there is flow in the right external and internal iliac arteries (which could be because of incomplete occlusion or collateral flow).  Postoperative changes of bifemoral bypass graft is noted (the graft is grossly patent).  No ascites or pneumoperitoneum and no pathologic distension of bowel.  No definite pathologic lymphadenopathy identified within the abdomen or pelvis.  There is several colonic diverticula noted, most pronounced in the region of the sigmoid colon, without surrounding inflammatory changes to suggest acute diverticulitis at this time.  Status post total abdominal hysterectomy and bilateral salpingo-oophorectomy. Urinary bladder is unremarkable in appearance.  Musculoskeletal: There are no aggressive appearing lytic or blastic lesions noted in the visualized portions of the skeleton.  The small round lesion in the inferior endplate of T9 with a well- defined rim of sclerosis is unchanged, most compatible with a Schmorl's node.  IMPRESSION:  1.  No evidence to suggest metastatic disease to the abdomen or pelvis. 2.  Mild colonic diverticulosis without findings to suggest acute diverticulitis. 3.  Extensive  atherosclerosis, including high-grade stenosis of the right common iliac artery (unchanged).  The patient is status post fem-fem bypass graft. 4.  Cholelithiasis without findings to suggest acute cholecystitis. 5.  Additional incidental findings, as above.  Original Report Authenticated By: Florencia Reasons, M.D.     ASSESSMENT: This is a very pleasant 78 years old white female with recurrent non-small cell lung cancer most recently treated with 6 cycles of systemic chemotherapy with carboplatin, paclitaxel and Avastin. The patient has no evidence for disease progression on his recent scan. She is currently being treated with maintenance chemotherapy in the form of Avastin at  15 mg per kilogram given every 3 weeks, status post 1 cycle. Patient was discussed with Dr. Arbutus Ped. She will proceed with cycle #2 of her maintenance chemotherapy with Avastin. She'll return in 3 weeks with repeat CBC differential, C. met and urine protein dipstick. She was advised to try to schedule her shave biopsies for the week prior to her chemotherapy. Patient voiced understanding.  [name], Monique Dupre E, PA-C   All questions were answered. The patient knows to call the clinic with any problems, questions or concerns. We can certainly see the patient much sooner if necessary.  I spent 20 minutes counseling the patient face to face. The total time spent in the appointment was 30 minutes.

## 2012-02-22 ENCOUNTER — Telehealth: Payer: Self-pay | Admitting: Medical Oncology

## 2012-02-22 NOTE — Telephone Encounter (Signed)
She saw dentist last week and  She was started on pcn.

## 2012-03-07 ENCOUNTER — Ambulatory Visit (HOSPITAL_BASED_OUTPATIENT_CLINIC_OR_DEPARTMENT_OTHER): Payer: Medicare Other | Admitting: Physician Assistant

## 2012-03-07 ENCOUNTER — Ambulatory Visit (HOSPITAL_BASED_OUTPATIENT_CLINIC_OR_DEPARTMENT_OTHER): Payer: Medicare Other

## 2012-03-07 ENCOUNTER — Other Ambulatory Visit (HOSPITAL_BASED_OUTPATIENT_CLINIC_OR_DEPARTMENT_OTHER): Payer: Medicare Other | Admitting: Lab

## 2012-03-07 ENCOUNTER — Encounter: Payer: Self-pay | Admitting: Physician Assistant

## 2012-03-07 ENCOUNTER — Telehealth: Payer: Self-pay | Admitting: Internal Medicine

## 2012-03-07 VITALS — BP 128/76 | HR 91 | Temp 97.2°F | Resp 20 | Ht 64.5 in | Wt 146.2 lb

## 2012-03-07 DIAGNOSIS — I10 Essential (primary) hypertension: Secondary | ICD-10-CM

## 2012-03-07 DIAGNOSIS — C3490 Malignant neoplasm of unspecified part of unspecified bronchus or lung: Secondary | ICD-10-CM

## 2012-03-07 DIAGNOSIS — M810 Age-related osteoporosis without current pathological fracture: Secondary | ICD-10-CM

## 2012-03-07 DIAGNOSIS — C3410 Malignant neoplasm of upper lobe, unspecified bronchus or lung: Secondary | ICD-10-CM

## 2012-03-07 DIAGNOSIS — Z5112 Encounter for antineoplastic immunotherapy: Secondary | ICD-10-CM

## 2012-03-07 DIAGNOSIS — J449 Chronic obstructive pulmonary disease, unspecified: Secondary | ICD-10-CM

## 2012-03-07 DIAGNOSIS — E7800 Pure hypercholesterolemia, unspecified: Secondary | ICD-10-CM

## 2012-03-07 LAB — COMPREHENSIVE METABOLIC PANEL (CC13)
ALT: 14 U/L (ref 0–55)
AST: 16 U/L (ref 5–34)
Alkaline Phosphatase: 64 U/L (ref 40–150)
CO2: 25 mEq/L (ref 22–29)
Creatinine: 0.8 mg/dL (ref 0.6–1.1)
Sodium: 139 mEq/L (ref 136–145)
Total Bilirubin: 0.7 mg/dL (ref 0.20–1.20)
Total Protein: 6.8 g/dL (ref 6.4–8.3)

## 2012-03-07 LAB — CBC WITH DIFFERENTIAL/PLATELET
Basophils Absolute: 0.1 10*3/uL (ref 0.0–0.1)
EOS%: 9.2 % — ABNORMAL HIGH (ref 0.0–7.0)
HGB: 13.3 g/dL (ref 11.6–15.9)
MCH: 31.7 pg (ref 25.1–34.0)
MCV: 94.7 fL (ref 79.5–101.0)
MONO%: 9.8 % (ref 0.0–14.0)
NEUT#: 3.2 10*3/uL (ref 1.5–6.5)
RBC: 4.19 10*6/uL (ref 3.70–5.45)
RDW: 14 % (ref 11.2–14.5)
lymph#: 0.5 10*3/uL — ABNORMAL LOW (ref 0.9–3.3)
nRBC: 0 % (ref 0–0)

## 2012-03-07 LAB — UA PROTEIN, DIPSTICK - CHCC: Protein, ur: NEGATIVE mg/dL

## 2012-03-07 MED ORDER — BEVACIZUMAB CHEMO INJECTION 400 MG/16ML
15.0000 mg/kg | Freq: Once | INTRAVENOUS | Status: AC
  Administered 2012-03-07: 1025 mg via INTRAVENOUS
  Filled 2012-03-07: qty 41

## 2012-03-07 MED ORDER — SODIUM CHLORIDE 0.9 % IV SOLN
Freq: Once | INTRAVENOUS | Status: AC
  Administered 2012-03-07: 12:00:00 via INTRAVENOUS

## 2012-03-07 MED ORDER — HEPARIN SOD (PORK) LOCK FLUSH 100 UNIT/ML IV SOLN
500.0000 [IU] | Freq: Once | INTRAVENOUS | Status: AC | PRN
  Administered 2012-03-07: 500 [IU]
  Filled 2012-03-07: qty 5

## 2012-03-07 MED ORDER — TRAMADOL HCL 50 MG PO TABS: 50.0000 mg | ORAL_TABLET | Freq: Four times a day (QID) | ORAL | Status: AC | PRN

## 2012-03-07 MED ORDER — SODIUM CHLORIDE 0.9 % IJ SOLN
10.0000 mL | INTRAMUSCULAR | Status: DC | PRN
  Administered 2012-03-07: 10 mL
  Filled 2012-03-07: qty 10

## 2012-03-07 NOTE — Telephone Encounter (Signed)
Gave pt appt for CT appt , pt will drink water based contrast @ [facility] radiology , NPO 4 hours prior to ct, pt has appt for lab, MD and chemo

## 2012-03-07 NOTE — Patient Instructions (Addendum)
Follow up with Dr, Arbutus Ped in 3 weeks with a restaging CT scan of your chest, abdomen and pelvis

## 2012-03-07 NOTE — Patient Instructions (Signed)
Cancer Center Discharge Instructions for Patients Receiving Chemotherapy  Today you received the following chemotherapy agents Avastin   If you develop nausea and vomiting that is not controlled by your nausea medication, call the clinic. If it is after clinic hours your family physician or the after hours number for the clinic or go to the Emergency Department.   BELOW ARE SYMPTOMS THAT SHOULD BE REPORTED IMMEDIATELY:  *FEVER GREATER THAN 100.5 F  *CHILLS WITH OR WITHOUT FEVER  NAUSEA AND VOMITING THAT IS NOT CONTROLLED WITH YOUR NAUSEA MEDICATION  *UNUSUAL SHORTNESS OF BREATH  *UNUSUAL BRUISING OR BLEEDING  TENDERNESS IN MOUTH AND THROAT WITH OR WITHOUT PRESENCE OF ULCERS  *URINARY PROBLEMS  *BOWEL PROBLEMS  UNUSUAL RASH Items with * indicate a potential emergency and should be followed up as soon as possible.  One of the nurses will contact you 24 hours after your treatment. Please let the nurse know about any problems that you may have experienced. Feel free to call the clinic you have any questions or concerns. The clinic phone number is [phone].   I have been informed and understand all the instructions given to me. I know to contact the clinic, my physician, or go to the Emergency Department if any problems should occur. I do not have any questions at this time, but understand that I may call the clinic during office hours   should I have any questions or need assistance in obtaining follow up care.    __________________________________________  _____________  __________ Signature of Patient or Authorized Representative            Date                   Time    __________________________________________ Nurse's Signature    

## 2012-03-07 NOTE — Progress Notes (Signed)
[facility] Telephone:([phone]   Fax:(336) [phone]  OFFICE PROGRESS NOTE  [name], MD [address] [facility], [place]. [place] [address]  DIAGNOSIS: Recurrent non-small cell lung cancer initially diagnosed as stage IIIA in (T1b N2 M0) in July 2012.  PRIOR THERAPY:  #1 Status post concurrent chemoradiation with weekly carboplatin and paclitaxel, last dose was given 03/01/2011.  #2 status post 3 seconds of consolidation chemotherapy was carboplatin for AUC of 5 and Alimta 500 mg/M2 giving him to see weeks, last dose was given 05/27/2011. # 3 systemic chemotherapy with carboplatin for AUC of 5, paclitaxel 175 mg/M2 and Avastin 15 mg/kg every 3 weeks. Status post 6 cycles   CURRENT THERAPY: maintenance Avastin 15 mg/kg every 3 weeks, status post 2 cycles  INTERVAL HISTORY: [name] 78 y.o. female returns to the clinic today for followup visit. She was recently placed on a course of penicillin for a painful tooth. Her Maurine Minister is told her that if this course of antibiotics does not take care of the problem and the pain returns she will likely need to tooth to be extracted. Currently she's had no recurrence of the pain. She requests a refill for her tramadol. She does plan to make an appointment with her primary care doctor regarding followup of her other health issues.  The patient is feeling fine today. She denied having any significant nausea or vomiting, no fever or chills. She has no significant peripheral neuropathy. No significant weight loss or night sweats. She denied having any chest pain, shortness breath, cough or hemoptysis.   MEDICAL HISTORY: Past Medical History  Diagnosis Date  . Lung mass   . History of radiation therapy 7/31/212-03/08/2011    right upper lobe lung adenocarcinoma  . COPD (chronic obstructive pulmonary disease)   . Hypertension   . Hypercholesterolemia   . Osteoporosis   . History of chemotherapy    carboplatin/paclitaxel  . Cancer July 2012    , right upper lobe    ALLERGIES:  is allergic to aleve and zyban.  MEDICATIONS:  Current Outpatient Prescriptions  Medication Sig Dispense Refill  . aspirin 81 MG tablet Take 160 mg by mouth as needed.        . colesevelam (WELCHOL) 625 MG tablet Take 1,875 mg by mouth at bedtime.        [name] dexamethasone (DECADRON) 4 MG tablet Take 4 mg by mouth 2 (two) times daily with a meal. Take 1 bid the day before , the day of and the day after chemo      . docusate sodium (COLACE) 100 MG capsule Take 100 mg by mouth 2 (two) times daily as needed.        . lidocaine-prilocaine (EMLA) cream       . LORazepam (ATIVAN) 1 MG tablet Taken prior to CT scans      . losartan-hydrochlorothiazide (HYZAAR) 50-12.5 MG per tablet Take 1 tablet by mouth daily.        [name] oxyCODONE-acetaminophen (PERCOCET) 5-325 MG per tablet Take by mouth as needed.       . penicillin v potassium (VEETID) 500 MG tablet       . polyethylene glycol (MIRALAX / GLYCOLAX) packet Take 17 g by mouth daily.      . prochlorperazine (COMPAZINE) 10 MG tablet Take 1 tablet (10 mg total) by mouth every 6 (six) hours as needed.  30 tablet  1  . Thiamine HCl (VITAMIN B-1 PO)  Take by mouth daily.      . traMADol (ULTRAM) 50 MG tablet Take 1 tablet (50 mg total) by mouth every 6 (six) hours as needed. Maximum dose= 8 tablets per day  60 tablet  0  . Vitamin D, Ergocalciferol, (DRISDOL) 50000 UNITS CAPS Take 50,000 Units by mouth every 7 (seven) days.       No current facility-administered medications for this visit.   Facility-Administered Medications Ordered in Other Visits  Medication Dose Route Frequency Provider Last Rate Last Dose  . 0.9 %  sodium chloride infusion   Intravenous Once Si Gaul, MD      . bevacizumab (AVASTIN) 1,025 mg in sodium chloride 0.9 % 100 mL chemo infusion  15 mg/kg (Treatment Plan Actual) Intravenous Once Si Gaul, MD   1,025 mg at 03/07/12 1226  . heparin  lock flush 100 unit/mL  500 Units Intracatheter Once PRN Si Gaul, MD   500 Units at 03/07/12 1300  . sodium chloride 0.9 % injection 10 mL  10 mL Intracatheter PRN Si Gaul, MD   10 mL at 03/07/12 1300    SURGICAL HISTORY:  Past Surgical History  Procedure Date  . Abdominal hysterectomy     s/p for fibroid tumor  . Portacath placement     right ij     REVIEW OF SYSTEMS:  Pertinent items are noted in HPI.   PHYSICAL EXAMINATION: General appearance: alert, cooperative and no distress Head: Normocephalic, without obvious abnormality, atraumatic Neck: no adenopathy Lymph nodes: Cervical, supraclavicular, and axillary nodes normal. Resp: clear to auscultation bilaterally Back: symmetric, no curvature. ROM normal. No CVA tenderness. Cardio: regular rate and rhythm, S1, S2 normal, no murmur, click, rub or gallop GI: soft, non-tender; bowel sounds normal; no masses,  no organomegaly Extremities: extremities normal, atraumatic, no cyanosis or edema Neurologic: Alert and oriented X 3, normal strength and tone. Normal symmetric reflexes. Normal coordination and gait  ECOG PERFORMANCE STATUS: 1 - Symptomatic but completely ambulatory  Blood pressure 128/76, pulse 91, temperature 97.2 F (36.2 C), resp. rate 20, height 5' 4.5" (1.638 m), weight 146 lb 3.2 oz (66.316 kg).  LABORATORY DATA: Lab Results  Component Value Date   WBC 4.6 03/07/2012   HGB 13.3 03/07/2012   HCT 39.7 03/07/2012   MCV 94.7 03/07/2012   PLT 151 03/07/2012      Chemistry      Component Value Date/Time   NA 139 03/07/2012 1021   NA 136 01/25/2012 1137   NA 142 09/16/2011 0917   K 3.8 03/07/2012 1021   K 4.1 01/25/2012 1137   K 3.7 09/16/2011 0917   CL 103 03/07/2012 1021   CL 109 01/25/2012 1137   CL 106 09/16/2011 0917   CO2 25 03/07/2012 1021   CO2 23 01/25/2012 1137   CO2 29 09/16/2011 0917   BUN 17.0 03/07/2012 1021   BUN 12 01/25/2012 1137   BUN 16 09/16/2011 0917   CREATININE 0.8 03/07/2012 1021    CREATININE 0.62 01/25/2012 1137   CREATININE 0.8 09/16/2011 0917      Component Value Date/Time   CALCIUM 9.6 03/07/2012 1021   CALCIUM 9.3 01/25/2012 1137   CALCIUM 8.3 09/16/2011 0917   ALKPHOS 64 03/07/2012 1021   ALKPHOS 51 01/25/2012 1137   ALKPHOS 62 09/16/2011 0917   AST 16 03/07/2012 1021   AST 13 01/25/2012 1137   AST 18 09/16/2011 0917   ALT 14 03/07/2012 1021   ALT 14 01/25/2012 1137   BILITOT 0.70  03/07/2012 1021   BILITOT 0.5 01/25/2012 1137   BILITOT 0.60 09/16/2011 0917       RADIOGRAPHIC STUDIES: Ct Chest W Contrast  01/21/2012  *RADIOLOGY REPORT*  Clinical Data:  History of lung cancer diagnosed in July 2012. Chemotherapy in progress.  Radiation therapy complete.  Cough and shortness of breath.  CT CHEST, ABDOMEN AND PELVIS WITH CONTRAST  Technique:  Multidetector CT imaging of the chest, abdomen and pelvis was performed following the standard protocol during bolus administration of intravenous contrast.  Contrast: OMNIPAQUE IOHEXOL 300 MG/ML  SOLN  Comparison:  CT of the chest abdomen and pelvis 11/19/2011.   CT CHEST  Findings:  Mediastinum: Heart size is normal. There is no significant pericardial fluid, thickening or pericardial calcification. There is atherosclerosis of the thoracic aorta, the great vessels of the mediastinum and the coronary arteries, including calcified atherosclerotic plaque in the left anterior descending coronary arteries. No pathologically enlarged mediastinal or hilar lymph nodes. Esophagus is unremarkable in appearance.  Right internal jugular single lumen Port-A-Cath with tip terminating in the distal superior vena cava.  Lungs/Pleura: Compared to prior examinations numerous tiny pulmonary nodules scattered throughout the lungs bilaterally appear unchanged in size, number and distribution.  Specific examples include the following: 4 mm right lower lobe subpleural nodule (image 44 of series 5) is unchanged, and a 4 mm subpleural nodule in right lower lobe (image  42 of series 5) is unchanged. No other definite new or enlarging suspicious appearing pulmonary nodules or masses are otherwise identified.  Postradiation changes in the right perihilar region and medial aspect of the right lower lobe are again noted.  Small right-sided pleural effusion layering dependently is similar to the prior examination.  Musculoskeletal: There are no aggressive appearing lytic or blastic lesions noted in the visualized portions of the skeleton.  IMPRESSION:  1.  Postradiation changes in the right hemithorax again noted, including evolving postradiation changes in the lungs and chronic small right-sided pleural effusion layering dependently.  Multiple tiny pulmonary nodules are unchanged compared to the prior examination.  No new nodules are identified on today's study. 2. Atherosclerosis, including left anterior descending coronary artery disease. 3.  Additional incidental findings, as above.   CT ABDOMEN AND PELVIS  Findings:  Abdomen/Pelvis: Numerous small gallstones are noted within the gallbladder.  No signs to suggest acute cholecystitis at this time. The appearance of the liver, pancreas, spleen and bilateral adrenal glands are unremarkable to.  A tiny 2-3 mm low attenuation lesions are noted in the kidneys bilaterally, too small to definitively characterize (statistically likely to represent cysts), but similar to priors.  There is extensive atherosclerosis of the abdominal and pelvic vasculature, without definite aneurysm or dissection.  There is a likely high-grade stenosis of the right common iliac artery, but there is flow in the right external and internal iliac arteries (which could be because of incomplete occlusion or collateral flow).  Postoperative changes of bifemoral bypass graft is noted (the graft is grossly patent).  No ascites or pneumoperitoneum and no pathologic distension of bowel.  No definite pathologic lymphadenopathy identified within the abdomen or pelvis.   There is several colonic diverticula noted, most pronounced in the region of the sigmoid colon, without surrounding inflammatory changes to suggest acute diverticulitis at this time.  Status post total abdominal hysterectomy and bilateral salpingo-oophorectomy. Urinary bladder is unremarkable in appearance.  Musculoskeletal: There are no aggressive appearing lytic or blastic lesions noted in the visualized portions of the skeleton.  The small  round lesion in the inferior endplate of T9 with a well- defined rim of sclerosis is unchanged, most compatible with a Schmorl's node.  IMPRESSION:  1.  No evidence to suggest metastatic disease to the abdomen or pelvis. 2.  Mild colonic diverticulosis without findings to suggest acute diverticulitis. 3.  Extensive atherosclerosis, including high-grade stenosis of the right common iliac artery (unchanged).  The patient is status post fem-fem bypass graft. 4.  Cholelithiasis without findings to suggest acute cholecystitis. 5.  Additional incidental findings, as above.  Original Report Authenticated By: Florencia Reasons, M.D.     ASSESSMENT: This is a very pleasant 78 years old white female with recurrent non-small cell lung cancer most recently treated with 6 cycles of systemic chemotherapy with carboplatin, paclitaxel and Avastin. The patient has no evidence for disease progression on his recent scan. She is currently being treated with maintenance chemotherapy in the form of Avastin at 15 mg per kilogram given every 3 weeks, status post 2 cycles. Patient was discussed with Dr. Arbutus Ped. She will proceed with cycle #3 of her maintenance chemotherapy with Avastin. She'll followup with Dr. Arbutus Ped in 3 weeks with repeat CBC differential, C. met and urine protein dipstick as well as a CT of her chest abdomen and pelvis with contrast to reevaluate her disease. Of note patient prefers the water-based contrast as this does not cause the nausea and vomiting that she had with the  more opaque variety of contrast. She was given a prescription for her Ultram tablets a total of 60 tablets with no refill. She is encouraged to followup with her primary care physician regarding her other health issues which include a hypertension COPD, hypercholesterolemia, and osteoporosis.,.  Tiana Loft E, PA-C   All questions were answered. The patient knows to call the clinic with any problems, questions or concerns. We can certainly see the patient much sooner if necessary.  I spent 20 minutes counseling the patient face to face. The total time spent in the appointment was 30 minutes.

## 2012-03-24 ENCOUNTER — Ambulatory Visit (HOSPITAL_COMMUNITY)
Admission: RE | Admit: 2012-03-24 | Discharge: 2012-03-24 | Disposition: A | Discharge status: Home / Self Care | Payer: Medicare Other | Source: Ambulatory Visit | Attending: Physician Assistant | Admitting: Physician Assistant

## 2012-03-24 DIAGNOSIS — K8020 Calculus of gallbladder without cholecystitis without obstruction: Secondary | ICD-10-CM | POA: Insufficient documentation

## 2012-03-24 DIAGNOSIS — C3490 Malignant neoplasm of unspecified part of unspecified bronchus or lung: Secondary | ICD-10-CM | POA: Insufficient documentation

## 2012-03-24 DIAGNOSIS — I2510 Atherosclerotic heart disease of native coronary artery without angina pectoris: Secondary | ICD-10-CM | POA: Insufficient documentation

## 2012-03-24 DIAGNOSIS — K5730 Diverticulosis of large intestine without perforation or abscess without bleeding: Secondary | ICD-10-CM | POA: Insufficient documentation

## 2012-03-24 DIAGNOSIS — I700 Atherosclerosis of aorta: Secondary | ICD-10-CM | POA: Insufficient documentation

## 2012-03-24 MED ORDER — IOHEXOL 300 MG/ML  SOLN
100.0000 mL | Freq: Once | INTRAMUSCULAR | Status: AC | PRN
  Administered 2012-03-24: 100 mL via INTRAVENOUS

## 2012-03-27 ENCOUNTER — Other Ambulatory Visit: Payer: Self-pay | Admitting: *Deleted

## 2012-03-28 ENCOUNTER — Other Ambulatory Visit (HOSPITAL_BASED_OUTPATIENT_CLINIC_OR_DEPARTMENT_OTHER): Payer: Medicare Other | Admitting: Lab

## 2012-03-28 ENCOUNTER — Ambulatory Visit (HOSPITAL_BASED_OUTPATIENT_CLINIC_OR_DEPARTMENT_OTHER): Payer: Medicare Other

## 2012-03-28 ENCOUNTER — Ambulatory Visit (HOSPITAL_BASED_OUTPATIENT_CLINIC_OR_DEPARTMENT_OTHER): Payer: Medicare Other | Admitting: Internal Medicine

## 2012-03-28 ENCOUNTER — Telehealth: Payer: Self-pay | Admitting: Internal Medicine

## 2012-03-28 VITALS — BP 119/75 | HR 96 | Temp 96.7°F | Resp 20

## 2012-03-28 DIAGNOSIS — C3410 Malignant neoplasm of upper lobe, unspecified bronchus or lung: Secondary | ICD-10-CM

## 2012-03-28 DIAGNOSIS — Z5112 Encounter for antineoplastic immunotherapy: Secondary | ICD-10-CM

## 2012-03-28 DIAGNOSIS — C3490 Malignant neoplasm of unspecified part of unspecified bronchus or lung: Secondary | ICD-10-CM

## 2012-03-28 LAB — CBC WITH DIFFERENTIAL/PLATELET
Basophils Absolute: 0.1 10*3/uL (ref 0.0–0.1)
Eosinophils Absolute: 0.7 10*3/uL — ABNORMAL HIGH (ref 0.0–0.5)
HGB: 14.3 g/dL (ref 11.6–15.9)
MCV: 94.9 fL (ref 79.5–101.0)
MONO#: 0.6 10*3/uL (ref 0.1–0.9)
MONO%: 11.2 % (ref 0.0–14.0)
NEUT#: 3.7 10*3/uL (ref 1.5–6.5)
Platelets: 167 10*3/uL (ref 145–400)
RDW: 14.5 % (ref 11.2–14.5)
WBC: 5.5 10*3/uL (ref 3.9–10.3)

## 2012-03-28 LAB — COMPREHENSIVE METABOLIC PANEL (CC13)
ALT: 12 U/L (ref 0–55)
Albumin: 3.7 g/dL (ref 3.5–5.0)
CO2: 23 mEq/L (ref 22–29)
Calcium: 9.8 mg/dL (ref 8.4–10.4)
Chloride: 105 mEq/L (ref 98–107)
Glucose: 85 mg/dl (ref 70–99)
Sodium: 140 mEq/L (ref 136–145)
Total Bilirubin: 0.6 mg/dL (ref 0.20–1.20)
Total Protein: 6.6 g/dL (ref 6.4–8.3)

## 2012-03-28 LAB — UA PROTEIN, DIPSTICK - CHCC: Protein, ur: NEGATIVE mg/dL

## 2012-03-28 MED ORDER — SODIUM CHLORIDE 0.9 % IV SOLN
15.0000 mg/kg | Freq: Once | INTRAVENOUS | Status: AC
  Administered 2012-03-28: 1025 mg via INTRAVENOUS
  Filled 2012-03-28: qty 41

## 2012-03-28 MED ORDER — SODIUM CHLORIDE 0.9 % IV SOLN
Freq: Once | INTRAVENOUS | Status: AC
  Administered 2012-03-28: 11:00:00 via INTRAVENOUS

## 2012-03-28 MED ORDER — SODIUM CHLORIDE 0.9 % IJ SOLN
10.0000 mL | INTRAMUSCULAR | Status: DC | PRN
  Administered 2012-03-28: 10 mL
  Filled 2012-03-28: qty 10

## 2012-03-28 MED ORDER — HEPARIN SOD (PORK) LOCK FLUSH 100 UNIT/ML IV SOLN
500.0000 [IU] | Freq: Once | INTRAVENOUS | Status: AC | PRN
  Administered 2012-03-28: 500 [IU]
  Filled 2012-03-28: qty 5

## 2012-03-28 NOTE — Patient Instructions (Addendum)
[facility] Discharge Instructions for Patients Receiving Chemotherapy  Today you received the following chemotherapy agents :  Avastin.  To help prevent nausea and vomiting after your treatment, we encourage you to take your nausea medication as instructed by your physician, and take meds as needed for nausea.    If you develop nausea and vomiting that is not controlled by your nausea medication, call the clinic. If it is after clinic hours your family physician or the after hours number for the clinic or go to the Emergency Department.   BELOW ARE SYMPTOMS THAT SHOULD BE REPORTED IMMEDIATELY:  *FEVER GREATER THAN 100.5 F  *CHILLS WITH OR WITHOUT FEVER  NAUSEA AND VOMITING THAT IS NOT CONTROLLED WITH YOUR NAUSEA MEDICATION  *UNUSUAL SHORTNESS OF BREATH  *UNUSUAL BRUISING OR BLEEDING  TENDERNESS IN MOUTH AND THROAT WITH OR WITHOUT PRESENCE OF ULCERS  *URINARY PROBLEMS  *BOWEL PROBLEMS  UNUSUAL RASH Items with * indicate a potential emergency and should be followed up as soon as possible.  One of the nurses will contact you 24 hours after your treatment. Please let the nurse know about any problems that you may have experienced. Feel free to call the clinic you have any questions or concerns. The clinic phone number is [phone].   I have been informed and understand all the instructions given to me. I know to contact the clinic, my physician, or go to the Emergency Department if any problems should occur. I do not have any questions at this time, but understand that I may call the clinic during office hours   should I have any questions or need assistance in obtaining follow up care.    __________________________________________  _____________  __________ Signature of Patient or Authorized Representative            Date                   Time    __________________________________________ Nurse's Signature

## 2012-03-28 NOTE — Telephone Encounter (Signed)
Gave pt appt for October and November 2013 lab, chemo and ML °

## 2012-03-28 NOTE — Patient Instructions (Addendum)
Your scan showed no evidence for disease progression. We will continue treatment with Avastin every 3 weeks. Followup in 3 weeks

## 2012-03-28 NOTE — Progress Notes (Signed)
[facility] Telephone:(336) [phone]   Fax:(336) [phone]  OFFICE PROGRESS NOTE  [name], MD [address] [facility], [place]. [place] [address]  DIAGNOSIS: Recurrent non-small cell lung cancer initially diagnosed as stage IIIA in (T1b N2 M0) in July 2012.   PRIOR THERAPY:  #1 Status post concurrent chemoradiation with weekly carboplatin and paclitaxel, last dose was given 03/01/2011.  #2 status post 3 seconds of consolidation chemotherapy was carboplatin for AUC of 5 and Alimta 500 mg/M2 giving him to see weeks, last dose was given 05/27/2011.  # 3 systemic chemotherapy with carboplatin for AUC of 5, paclitaxel 175 mg/M2 and Avastin 15 mg/kg every 3 weeks. Status post 6 cycles   CURRENT THERAPY: maintenance Avastin 15 mg/kg every 3 weeks, status post 3 cycles  INTERVAL HISTORY: [name] 78 y.o. female returns to the clinic today for followup visit accompanied by her husband. The patient is feeling fine today with no specific complaints. She denied having any significant chest pain, shortness breath, cough or hemoptysis. She denied having any significant weight loss or night sweats. The patient has no nausea or vomiting, no fever or chills. She is tolerating her maintenance treatment with Avastin fairly well with no significant adverse effects. She has repeat CT scan of the chest, abdomen and pelvis performed recently and she is here for evaluation and discussion of her scan results.  MEDICAL HISTORY: Past Medical History  Diagnosis Date  . Lung mass   . History of radiation therapy 7/31/212-03/08/2011    right upper lobe lung adenocarcinoma  . COPD (chronic obstructive pulmonary disease)   . Hypertension   . Hypercholesterolemia   . Osteoporosis   . History of chemotherapy     carboplatin/paclitaxel  . Cancer July 2012    , right upper lobe    ALLERGIES:  is allergic to aleve and zyban.  MEDICATIONS:  Current Outpatient  Prescriptions  Medication Sig Dispense Refill  . aspirin 81 MG tablet Take 160 mg by mouth as needed.        . colesevelam (WELCHOL) 625 MG tablet Take 1,875 mg by mouth at bedtime.        [name] dexamethasone (DECADRON) 4 MG tablet Take 4 mg by mouth 2 (two) times daily with a meal. Take 1 bid the day before , the day of and the day after chemo      . docusate sodium (COLACE) 100 MG capsule Take 100 mg by mouth 2 (two) times daily as needed.        . lidocaine-prilocaine (EMLA) cream       . LORazepam (ATIVAN) 1 MG tablet Taken prior to CT scans      . losartan-hydrochlorothiazide (HYZAAR) 50-12.5 MG per tablet Take 1 tablet by mouth daily.        [name] oxyCODONE-acetaminophen (PERCOCET) 5-325 MG per tablet Take by mouth as needed.       . penicillin v potassium (VEETID) 500 MG tablet       . polyethylene glycol (MIRALAX / GLYCOLAX) packet Take 17 g by mouth daily.      . prochlorperazine (COMPAZINE) 10 MG tablet Take 1 tablet (10 mg total) by mouth every 6 (six) hours as needed.  30 tablet  1  . Thiamine HCl (VITAMIN B-1 PO) Take by mouth daily.      . traMADol (ULTRAM) 50 MG tablet Take 1 tablet (50 mg total) by mouth every 6 (six) hours as needed. Maximum dose=  8 tablets per day  60 tablet  0  . Vitamin D, Ergocalciferol, (DRISDOL) 50000 UNITS CAPS Take 50,000 Units by mouth every 7 (seven) days.        SURGICAL HISTORY:  Past Surgical History  Procedure Date  . Abdominal hysterectomy     s/p for fibroid tumor  . Portacath placement     right ij     REVIEW OF SYSTEMS:  A comprehensive review of systems was negative.   PHYSICAL EXAMINATION: General appearance: alert, cooperative and no distress Head: Normocephalic, without obvious abnormality, atraumatic Neck: no adenopathy Lymph nodes: Cervical, supraclavicular, and axillary nodes normal. Resp: clear to auscultation bilaterally Cardio: regular rate and rhythm, S1, S2 normal, no murmur, click, rub or gallop GI: soft, non-tender; bowel  sounds normal; no masses,  no organomegaly Extremities: extremities normal, atraumatic, no cyanosis or edema Neurologic: Alert and oriented X 3, normal strength and tone. Normal symmetric reflexes. Normal coordination and gait  ECOG PERFORMANCE STATUS: 1 - Symptomatic but completely ambulatory  Blood pressure 119/75, pulse 96, temperature 96.7 F (35.9 C), temperature source Oral, resp. rate 20.  LABORATORY DATA: Lab Results  Component Value Date   WBC 5.5 03/28/2012   HGB 14.3 03/28/2012   HCT 43.4 03/28/2012   MCV 94.9 03/28/2012   PLT 167 03/28/2012      Chemistry      Component Value Date/Time   NA 139 03/07/2012 1021   NA 136 01/25/2012 1137   NA 142 09/16/2011 0917   K 3.8 03/07/2012 1021   K 4.1 01/25/2012 1137   K 3.7 09/16/2011 0917   CL 103 03/07/2012 1021   CL 109 01/25/2012 1137   CL 106 09/16/2011 0917   CO2 25 03/07/2012 1021   CO2 23 01/25/2012 1137   CO2 29 09/16/2011 0917   BUN 17.0 03/07/2012 1021   BUN 12 01/25/2012 1137   BUN 16 09/16/2011 0917   CREATININE 0.8 03/07/2012 1021   CREATININE 0.62 01/25/2012 1137   CREATININE 0.8 09/16/2011 0917      Component Value Date/Time   CALCIUM 9.6 03/07/2012 1021   CALCIUM 9.3 01/25/2012 1137   CALCIUM 8.3 09/16/2011 0917   ALKPHOS 64 03/07/2012 1021   ALKPHOS 51 01/25/2012 1137   ALKPHOS 62 09/16/2011 0917   AST 16 03/07/2012 1021   AST 13 01/25/2012 1137   AST 18 09/16/2011 0917   ALT 14 03/07/2012 1021   ALT 14 01/25/2012 1137   BILITOT 0.70 03/07/2012 1021   BILITOT 0.5 01/25/2012 1137   BILITOT 0.60 09/16/2011 0917       RADIOGRAPHIC STUDIES: Ct Abdomen Pelvis W Contrast  03/24/2012  *RADIOLOGY REPORT*  Clinical Data:  Lung cancer, restaging.  CT CHEST, ABDOMEN AND PELVIS WITH CONTRAST  Technique:  Multidetector CT imaging of the chest, abdomen and pelvis was performed following the standard protocol during bolus administration of intravenous contrast.  Contrast: OMNIPAQUE IOHEXOL 300 MG/ML  SOLN  Comparison:  01/21/2012  CT CHEST   Findings: No axillary or supraclavicular adenopathy.  No mediastinal or hilar adenopathy identified.  No pericardial or pleural effusion. Calcifications involving the LAD coronary artery noted.  Paramediastinal radiation changes identified within the right lung. Within the medial aspect of the right lower lobe there is a paramediastinal area of nodular, consolidation measuring 1.5 x 1.9 cm, image 27.  This is stable when compared with the previous exam. Within the anterior right upper lobe there is a focal area of paramediastinal consolidation/soft tissue measuring 2.7  x 1.1 cm, image 27.  This is also unchanged when compared with previous examination.  Again identified are multiple small pulmonary nodules in both lungs consistent with metastatic disease.  Index nodule in the right lower lobe measures 3.5 mm, image 43.  Unchanged from previous exam.  Slightly more anterior is a 5.7 mm nodule, image 43.  This is compared with 3.8 mm previously.  Right middle lobe nodule measures 6.4 mm, image 26.  Stable from previous exam.  Nodule within the left lower lobe measures 4.7 mm, image 23.  Previously this measured 4.5 mm.  Review of the visualized osseous structures shows no aggressive lytic or sclerotic bone lesions.  IMPRESSION:  1.  Similar appearance of post radiation changes in the right hemithorax. 2.  No significant change in multiple tiny nodules compared with previous exam consistent with metastatic disease.  CT ABDOMEN AND PELVIS  Findings:  4.7 mm hypodensity in the left hepatic lobe is identified, image 48.  Unchanged from previous exam, image 48. Several small stones noted within the gallbladder fundus.  No biliary dilatation.  The pancreas is normal.  The spleen is unremarkable.  Both adrenal glands are normal.  Normal appearance of the right kidney.  The left kidney is normal.  The urinary bladder appears within normal limits.  No adenopathy within the upper abdomen.  No pelvic or inguinal adenopathy.  The  there is calcified atherosclerotic disease affecting the abdominal aorta and its branches.  The patient is status post femoral to femoral bypass grafting.  The stomach and the small bowel loops are normal.  The proximal colon is unremarkable.  Multiple sigmoid diverticula without acute inflammation.  Review of the visualized osseous structures is significant for lumbar degenerative disc disease and facet degenerative change. There is ankylosis of the L4 and L5 vertebra.  IMPRESSION:  1.  No acute findings identified within the abdomen or pelvis. 2.  No evidence for metastatic disease.   Original Report Authenticated By: Rosealee Albee, M.D.     ASSESSMENT: This is a very pleasant 78 years old white female with metastatic non-small cell lung cancer, adenocarcinoma currently on maintenance treatment with Avastin status post 3 cycles. The patient is tolerating her treatment fairly well and she has no evidence for disease progression.  PLAN: I discussed the scan results with the patient and her husband. I recommended for her to continue on treatment with maintenance Avastin at the current dose. She will receive cycle #4 today. She would come back for followup visit in 3 weeks with the start of the next cycle of her treatment. She was advised to call me immediately if she has any concerning symptoms in the interval.  All questions were answered. The patient knows to call the clinic with any problems, questions or concerns. We can certainly see the patient much sooner if necessary.

## 2012-03-30 ENCOUNTER — Telehealth: Payer: Self-pay | Admitting: Internal Medicine

## 2012-03-30 NOTE — Telephone Encounter (Signed)
Tal;ked to patient gave her appt for October 2013, she will come by to get a calendar at the end of the month

## 2012-04-03 ENCOUNTER — Encounter: Payer: Self-pay | Admitting: Cardiology

## 2012-04-10 ENCOUNTER — Telehealth: Payer: Self-pay | Admitting: *Deleted

## 2012-04-10 NOTE — Telephone Encounter (Signed)
Pt called stating that he has to get a tooth extracted.  She wants to see when it would be appropriate to get this done since she is one avastin.  Per Dr Donnald Garre, it would need to be 3-4 weeks from her last cycle completed 10/8.  Informed pt that she can schedule her extraction starting week of 10/28.  She will call when he has her tooth extraction appt scheduled so we can r/s her appts from 10/29 to 3 weeks after her tooth extraction per Dr Donnald Garre.  SLJ

## 2012-04-14 ENCOUNTER — Telehealth: Payer: Self-pay | Admitting: *Deleted

## 2012-04-14 ENCOUNTER — Telehealth: Payer: Self-pay | Admitting: Internal Medicine

## 2012-04-14 NOTE — Telephone Encounter (Signed)
Pt called and f/u that her tooth extraction will be done on 10/30 at 10:00am.  Will proceed to cancel lab/ AJ/ Chemo appt scheduled 10/29 and r/s these appts to the week of 11/19.

## 2012-04-14 NOTE — Telephone Encounter (Signed)
s/w pt and she is aware of her appt,also mailed her a sch per her req     aom

## 2012-04-18 ENCOUNTER — Other Ambulatory Visit: Payer: Medicare Other | Admitting: Lab

## 2012-04-18 ENCOUNTER — Ambulatory Visit: Payer: Medicare Other

## 2012-04-18 ENCOUNTER — Ambulatory Visit: Payer: Medicare Other | Admitting: Physician Assistant

## 2012-04-25 ENCOUNTER — Telehealth: Payer: Self-pay | Admitting: *Deleted

## 2012-04-25 NOTE — Telephone Encounter (Signed)
Pt went to see PCP Dr Clelia Croft on Monday regarding new development of rectal bleeding that started on Saturday.  Per pt, rectal exam was done, and they are unsure whether it was hemorrhoids vs. Diverticulitis.  They will send her to a gastroenterologist if symptoms return.  Pt states she has no bleeding at this time.  Per Dr Donnald Garre, he agrees with Dr Alver Fisher plan and it is okay to proceed with chemo as scheduled.  Pt verbalized understanding.  SLJ

## 2012-05-03 ENCOUNTER — Telehealth: Payer: Self-pay | Admitting: Medical Oncology

## 2012-05-03 NOTE — Telephone Encounter (Signed)
Can she have chemotherapy next week -recent hx of blood in stool -saw Dr Dulce Sellar and he put her on suppository  For hemorrhoids. I told pt to keep her appts here as scheduled.

## 2012-05-09 ENCOUNTER — Ambulatory Visit (HOSPITAL_BASED_OUTPATIENT_CLINIC_OR_DEPARTMENT_OTHER): Payer: Medicare Other

## 2012-05-09 ENCOUNTER — Telehealth: Payer: Self-pay | Admitting: *Deleted

## 2012-05-09 ENCOUNTER — Other Ambulatory Visit: Payer: Medicare Other | Admitting: Lab

## 2012-05-09 ENCOUNTER — Ambulatory Visit (HOSPITAL_BASED_OUTPATIENT_CLINIC_OR_DEPARTMENT_OTHER): Payer: Medicare Other | Admitting: Physician Assistant

## 2012-05-09 ENCOUNTER — Telehealth: Payer: Self-pay | Admitting: Internal Medicine

## 2012-05-09 ENCOUNTER — Other Ambulatory Visit (HOSPITAL_BASED_OUTPATIENT_CLINIC_OR_DEPARTMENT_OTHER): Payer: Medicare Other | Admitting: Lab

## 2012-05-09 VITALS — BP 143/78 | HR 91 | Temp 97.6°F | Resp 20 | Ht 64.5 in | Wt 144.6 lb

## 2012-05-09 DIAGNOSIS — Z5112 Encounter for antineoplastic immunotherapy: Secondary | ICD-10-CM

## 2012-05-09 DIAGNOSIS — C3410 Malignant neoplasm of upper lobe, unspecified bronchus or lung: Secondary | ICD-10-CM

## 2012-05-09 DIAGNOSIS — C3490 Malignant neoplasm of unspecified part of unspecified bronchus or lung: Secondary | ICD-10-CM

## 2012-05-09 DIAGNOSIS — Z452 Encounter for adjustment and management of vascular access device: Secondary | ICD-10-CM

## 2012-05-09 LAB — COMPREHENSIVE METABOLIC PANEL (CC13)
ALT: 22 U/L (ref 0–55)
AST: 20 U/L (ref 5–34)
Albumin: 3.6 g/dL (ref 3.5–5.0)
Alkaline Phosphatase: 77 U/L (ref 40–150)
Glucose: 90 mg/dl (ref 70–99)
Potassium: 4.1 mEq/L (ref 3.5–5.1)
Sodium: 140 mEq/L (ref 136–145)
Total Bilirubin: 0.72 mg/dL (ref 0.20–1.20)
Total Protein: 6.7 g/dL (ref 6.4–8.3)

## 2012-05-09 LAB — CBC WITH DIFFERENTIAL/PLATELET
BASO%: 0.5 % (ref 0.0–2.0)
HCT: 42.7 % (ref 34.8–46.6)
MCHC: 32.6 g/dL (ref 31.5–36.0)
MONO#: 0.6 10*3/uL (ref 0.1–0.9)
NEUT%: 67.3 % (ref 38.4–76.8)
RDW: 14.2 % (ref 11.2–14.5)
WBC: 5.8 10*3/uL (ref 3.9–10.3)
lymph#: 0.5 10*3/uL — ABNORMAL LOW (ref 0.9–3.3)
nRBC: 0 % (ref 0–0)

## 2012-05-09 LAB — UA PROTEIN, DIPSTICK - CHCC: Protein, ur: NEGATIVE mg/dL

## 2012-05-09 MED ORDER — ALTEPLASE 2 MG IJ SOLR
2.0000 mg | Freq: Once | INTRAMUSCULAR | Status: AC | PRN
  Administered 2012-05-09: 2 mg
  Filled 2012-05-09: qty 2

## 2012-05-09 MED ORDER — SODIUM CHLORIDE 0.9 % IJ SOLN
10.0000 mL | INTRAMUSCULAR | Status: DC | PRN
  Administered 2012-05-09: 10 mL
  Filled 2012-05-09: qty 10

## 2012-05-09 MED ORDER — LIDOCAINE-PRILOCAINE 2.5-2.5 % EX CREA: TOPICAL_CREAM | CUTANEOUS | Status: AC | PRN

## 2012-05-09 MED ORDER — HEPARIN SOD (PORK) LOCK FLUSH 100 UNIT/ML IV SOLN
500.0000 [IU] | Freq: Once | INTRAVENOUS | Status: AC | PRN
  Administered 2012-05-09: 500 [IU]
  Filled 2012-05-09: qty 5

## 2012-05-09 MED ORDER — LIDOCAINE-PRILOCAINE 2.5-2.5 % EX CREA: TOPICAL_CREAM | CUTANEOUS | Status: DC | PRN

## 2012-05-09 MED ORDER — SODIUM CHLORIDE 0.9 % IV SOLN
Freq: Once | INTRAVENOUS | Status: AC
  Administered 2012-05-09: 12:00:00 via INTRAVENOUS

## 2012-05-09 MED ORDER — SODIUM CHLORIDE 0.9 % IV SOLN
1000.0000 mg | Freq: Once | INTRAVENOUS | Status: AC
  Administered 2012-05-09: 1000 mg via INTRAVENOUS
  Filled 2012-05-09: qty 40

## 2012-05-09 NOTE — Progress Notes (Signed)
Urine Protein negative 

## 2012-05-09 NOTE — Patient Instructions (Addendum)
[facility] Discharge Instructions for Patients Receiving Chemotherapy  Today you received the following chemotherapy agents: avastin   To help prevent nausea and vomiting after your treatment, we encourage you to take your nausea medication.  Take it as often as prescribed.     If you develop nausea and vomiting that is not controlled by your nausea medication, call the clinic. If it is after clinic hours your family physician or the after hours number for the clinic or go to the Emergency Department.   BELOW ARE SYMPTOMS THAT SHOULD BE REPORTED IMMEDIATELY:  *FEVER GREATER THAN 100.5 F  *CHILLS WITH OR WITHOUT FEVER  NAUSEA AND VOMITING THAT IS NOT CONTROLLED WITH YOUR NAUSEA MEDICATION  *UNUSUAL SHORTNESS OF BREATH  *UNUSUAL BRUISING OR BLEEDING  TENDERNESS IN MOUTH AND THROAT WITH OR WITHOUT PRESENCE OF ULCERS  *URINARY PROBLEMS  *BOWEL PROBLEMS  UNUSUAL RASH Items with * indicate a potential emergency and should be followed up as soon as possible.  Feel free to call the clinic you have any questions or concerns. The clinic phone number is [phone].   I have been informed and understand all the instructions given to me. I know to contact the clinic, my physician, or go to the Emergency Department if any problems should occur. I do not have any questions at this time, but understand that I may call the clinic during office hours   should I have any questions or need assistance in obtaining follow up care.    __________________________________________  _____________  __________ Signature of Patient or Authorized Representative            Date                   Time    __________________________________________ Nurse's Signature    

## 2012-05-09 NOTE — Telephone Encounter (Signed)
Per staff message and POF I have scheduled appts.  JMW  

## 2012-05-09 NOTE — Telephone Encounter (Signed)
appt made and printed for pt pt aware that tx will follow md and email to mw has been sent       anne

## 2012-05-09 NOTE — Patient Instructions (Addendum)
Follow up in 3 weeks prior to your next cycle of maintenance Avastin 

## 2012-05-15 NOTE — Progress Notes (Signed)
[facility] [phone]   [phone]  OFFICE PROGRESS NOTE  [name], MD [address] [facility], [place]. [place] [address]  DIAGNOSIS: Recurrent non-small cell lung cancer initially diagnosed as stage IIIA in (T1b N2 M0) in July 2012.   PRIOR THERAPY:  #1 Status post concurrent chemoradiation with weekly carboplatin and paclitaxel, last dose was given 03/01/2011.  #2 status post 3 seconds of consolidation chemotherapy was carboplatin for AUC of 5 and Alimta 500 mg/M2 giving him to see weeks, last dose was given 05/27/2011.  # 3 systemic chemotherapy with carboplatin for AUC of 5, paclitaxel 175 mg/M2 and Avastin 15 mg/kg every 3 weeks. Status post 6 cycles   CURRENT THERAPY: maintenance Avastin 15 mg/kg every 3 weeks, status post 4 cycles  INTERVAL HISTORY: [name] 78 y.o. female returns to the clinic today for followup visit accompanied by her husband. Patient reports his tooth extracted proximally 04/24/2012. She also states that she had blood in her stool for approximately 2 days. She was seen by Dr. Dulce Sellar who gave her a medicated suppository. She's had no further blood in her stool. She was told was possibly related to her hemorrhoids versus diverticulitis. Overall she's tolerating her chemotherapy relatively well although she occasionally has sores on her tongue but these have resolved. She requests a refill for her EMLA cream. The patient is feeling fine today with no specific complaints. She denied having any significant chest pain, shortness breath, cough or hemoptysis. She denied having any significant weight loss or night sweats. The patient has no nausea or vomiting, no fever or chills. She is tolerating her maintenance treatment with Avastin fairly well with no significant adverse effects.   MEDICAL HISTORY: Past Medical History  Diagnosis Date  . Lung mass   . History of radiation therapy  7/31/212-03/08/2011    right upper lobe lung adenocarcinoma  . COPD (chronic obstructive pulmonary disease)   . Hypertension   . Hypercholesterolemia   . Osteoporosis   . History of chemotherapy     carboplatin/paclitaxel  . Cancer July 2012    , right upper lobe    ALLERGIES:  is allergic to aleve and zyban.  MEDICATIONS:  Current Outpatient Prescriptions  Medication Sig Dispense Refill  . aspirin 81 MG tablet Take 160 mg by mouth as needed.        . colesevelam (WELCHOL) 625 MG tablet Take 1,875 mg by mouth at bedtime.        . docusate sodium (COLACE) 100 MG capsule Take 100 mg by mouth 2 (two) times daily as needed.        [name] HYDROcodone-acetaminophen (NORCO/VICODIN) 5-325 MG per tablet       . lidocaine-prilocaine (EMLA) cream Apply topically as needed.  30 g  1  . lidocaine-prilocaine (EMLA) cream Apply topically as needed. Use as directed  30 g  1  . LORazepam (ATIVAN) 1 MG tablet Taken prior to CT scans      . losartan-hydrochlorothiazide (HYZAAR) 50-12.5 MG per tablet Take 1 tablet by mouth daily. Takes 1/2 tablet by mouth daily      . oxyCODONE-acetaminophen (PERCOCET) 5-325 MG per tablet Take by mouth as needed.       . polyethylene glycol (MIRALAX / GLYCOLAX) packet Take 17 g by mouth daily.      . prochlorperazine (COMPAZINE) 10 MG tablet Take 1 tablet (10 mg total) by mouth every 6 (six) hours as needed.  30  tablet  1  . Thiamine HCl (VITAMIN B-1 PO) Take by mouth daily.      . traMADol (ULTRAM) 50 MG tablet Take 1 tablet (50 mg total) by mouth every 6 (six) hours as needed. Maximum dose= 8 tablets per day  60 tablet  0  . Vitamin D, Ergocalciferol, (DRISDOL) 50000 UNITS CAPS Take 50,000 Units by mouth every 7 (seven) days.        SURGICAL HISTORY:  Past Surgical History  Procedure Date  . Abdominal hysterectomy     s/p for fibroid tumor  . Portacath placement     right ij     REVIEW OF SYSTEMS:  A comprehensive review of systems was negative.   PHYSICAL  EXAMINATION: General appearance: alert, cooperative and no distress Head: Normocephalic, without obvious abnormality, atraumatic Neck: no adenopathy Lymph nodes: Cervical, supraclavicular, and axillary nodes normal. Resp: clear to auscultation bilaterally Cardio: regular rate and rhythm, S1, S2 normal, no murmur, click, rub or gallop GI: soft, non-tender; bowel sounds normal; no masses,  no organomegaly Extremities: extremities normal, atraumatic, no cyanosis or edema Neurologic: Alert and oriented X 3, normal strength and tone. Normal symmetric reflexes. Normal coordination and gait  ECOG PERFORMANCE STATUS: 1 - Symptomatic but completely ambulatory  Blood pressure 143/78, pulse 91, temperature 97.6 F (36.4 C), temperature source Oral, resp. rate 20, height 5' 4.5" (1.638 m), weight 144 lb 9.6 oz (65.59 kg).  LABORATORY DATA: Lab Results  Component Value Date   WBC 5.8 05/09/2012   HGB 13.9 05/09/2012   HCT 42.7 05/09/2012   MCV 91.0 05/09/2012   PLT 136* 05/09/2012      Chemistry      Component Value Date/Time   NA 140 05/09/2012 0927   NA 136 01/25/2012 1137   NA 142 09/16/2011 0917   K 4.1 05/09/2012 0927   K 4.1 01/25/2012 1137   K 3.7 09/16/2011 0917   CL 108* 05/09/2012 0927   CL 109 01/25/2012 1137   CL 106 09/16/2011 0917   CO2 25 05/09/2012 0927   CO2 23 01/25/2012 1137   CO2 29 09/16/2011 0917   BUN 19.0 05/09/2012 0927   BUN 12 01/25/2012 1137   BUN 16 09/16/2011 0917   CREATININE 0.8 05/09/2012 0927   CREATININE 0.62 01/25/2012 1137   CREATININE 0.8 09/16/2011 0917      Component Value Date/Time   CALCIUM 9.4 05/09/2012 0927   CALCIUM 9.3 01/25/2012 1137   CALCIUM 8.3 09/16/2011 0917   ALKPHOS 77 05/09/2012 0927   ALKPHOS 51 01/25/2012 1137   ALKPHOS 62 09/16/2011 0917   AST 20 05/09/2012 0927   AST 13 01/25/2012 1137   AST 18 09/16/2011 0917   ALT 22 05/09/2012 0927   ALT 14 01/25/2012 1137   BILITOT 0.72 05/09/2012 0927   BILITOT 0.5 01/25/2012 1137   BILITOT 0.60  09/16/2011 0917       RADIOGRAPHIC STUDIES: Ct Abdomen Pelvis W Contrast  03/24/2012  *RADIOLOGY REPORT*  Clinical Data:  Lung cancer, restaging.  CT CHEST, ABDOMEN AND PELVIS WITH CONTRAST  Technique:  Multidetector CT imaging of the chest, abdomen and pelvis was performed following the standard protocol during bolus administration of intravenous contrast.  Contrast: OMNIPAQUE IOHEXOL 300 MG/ML  SOLN  Comparison:  01/21/2012  CT CHEST  Findings: No axillary or supraclavicular adenopathy.  No mediastinal or hilar adenopathy identified.  No pericardial or pleural effusion. Calcifications involving the LAD coronary artery noted.  Paramediastinal radiation changes identified within the  right lung. Within the medial aspect of the right lower lobe there is a paramediastinal area of nodular, consolidation measuring 1.5 x 1.9 cm, image 27.  This is stable when compared with the previous exam. Within the anterior right upper lobe there is a focal area of paramediastinal consolidation/soft tissue measuring 2.7 x 1.1 cm, image 27.  This is also unchanged when compared with previous examination.  Again identified are multiple small pulmonary nodules in both lungs consistent with metastatic disease.  Index nodule in the right lower lobe measures 3.5 mm, image 43.  Unchanged from previous exam.  Slightly more anterior is a 5.7 mm nodule, image 43.  This is compared with 3.8 mm previously.  Right middle lobe nodule measures 6.4 mm, image 26.  Stable from previous exam.  Nodule within the left lower lobe measures 4.7 mm, image 23.  Previously this measured 4.5 mm.  Review of the visualized osseous structures shows no aggressive lytic or sclerotic bone lesions.  IMPRESSION:  1.  Similar appearance of post radiation changes in the right hemithorax. 2.  No significant change in multiple tiny nodules compared with previous exam consistent with metastatic disease.  CT ABDOMEN AND PELVIS  Findings:  4.7 mm hypodensity in the  left hepatic lobe is identified, image 48.  Unchanged from previous exam, image 48. Several small stones noted within the gallbladder fundus.  No biliary dilatation.  The pancreas is normal.  The spleen is unremarkable.  Both adrenal glands are normal.  Normal appearance of the right kidney.  The left kidney is normal.  The urinary bladder appears within normal limits.  No adenopathy within the upper abdomen.  No pelvic or inguinal adenopathy.  The there is calcified atherosclerotic disease affecting the abdominal aorta and its branches.  The patient is status post femoral to femoral bypass grafting.  The stomach and the small bowel loops are normal.  The proximal colon is unremarkable.  Multiple sigmoid diverticula without acute inflammation.  Review of the visualized osseous structures is significant for lumbar degenerative disc disease and facet degenerative change. There is ankylosis of the L4 and L5 vertebra.  IMPRESSION:  1.  No acute findings identified within the abdomen or pelvis. 2.  No evidence for metastatic disease.   Original Report Authenticated By: Rosealee Albee, M.D.     ASSESSMENT/PLAN: This is a very pleasant 78 years old white female with metastatic non-small cell lung cancer, adenocarcinoma currently on maintenance treatment with Avastin status post 4 cycles. The patient is tolerating her treatment fairly well and she has no evidence for disease progression. Patient was discussed with Dr. Arbutus Ped. She'll proceed with cycle #5 of maintenance Avastin today as scheduled. She will return in 3 weeks prior to her next scheduled cycle of maintenance Avastin.  Laural Benes, Anis Cinelli E, PA-C   All questions were answered. The patient knows to call the clinic with any problems, questions or concerns. We can certainly see the patient much sooner if necessary.

## 2012-05-30 ENCOUNTER — Ambulatory Visit (HOSPITAL_BASED_OUTPATIENT_CLINIC_OR_DEPARTMENT_OTHER): Payer: Medicare Other

## 2012-05-30 ENCOUNTER — Encounter: Payer: Self-pay | Admitting: Physician Assistant

## 2012-05-30 ENCOUNTER — Telehealth: Payer: Self-pay | Admitting: *Deleted

## 2012-05-30 ENCOUNTER — Other Ambulatory Visit (HOSPITAL_BASED_OUTPATIENT_CLINIC_OR_DEPARTMENT_OTHER): Payer: Medicare Other | Admitting: Lab

## 2012-05-30 ENCOUNTER — Ambulatory Visit (HOSPITAL_BASED_OUTPATIENT_CLINIC_OR_DEPARTMENT_OTHER): Payer: Medicare Other | Admitting: Physician Assistant

## 2012-05-30 ENCOUNTER — Telehealth: Payer: Self-pay | Admitting: Internal Medicine

## 2012-05-30 VITALS — BP 123/73 | HR 84 | Temp 96.9°F | Resp 18 | Ht 64.5 in | Wt 144.1 lb

## 2012-05-30 DIAGNOSIS — C3490 Malignant neoplasm of unspecified part of unspecified bronchus or lung: Secondary | ICD-10-CM

## 2012-05-30 DIAGNOSIS — C3410 Malignant neoplasm of upper lobe, unspecified bronchus or lung: Secondary | ICD-10-CM

## 2012-05-30 DIAGNOSIS — Z5112 Encounter for antineoplastic immunotherapy: Secondary | ICD-10-CM

## 2012-05-30 LAB — CBC WITH DIFFERENTIAL/PLATELET
BASO%: 0.6 % (ref 0.0–2.0)
Basophils Absolute: 0 10e3/uL (ref 0.0–0.1)
EOS%: 12 % — ABNORMAL HIGH (ref 0.0–7.0)
Eosinophils Absolute: 0.8 10e3/uL — ABNORMAL HIGH (ref 0.0–0.5)
HCT: 44.5 % (ref 34.8–46.6)
HGB: 14.7 g/dL (ref 11.6–15.9)
LYMPH%: 5.8 % — ABNORMAL LOW (ref 14.0–49.7)
MCH: 30.1 pg (ref 25.1–34.0)
MCHC: 33 g/dL (ref 31.5–36.0)
MCV: 91.2 fL (ref 79.5–101.0)
MONO#: 0.7 10e3/uL (ref 0.1–0.9)
MONO%: 10.5 % (ref 0.0–14.0)
NEUT#: 4.9 10e3/uL (ref 1.5–6.5)
NEUT%: 71.1 % (ref 38.4–76.8)
Platelets: 166 10e3/uL (ref 145–400)
RBC: 4.88 10e6/uL (ref 3.70–5.45)
RDW: 14.7 % — ABNORMAL HIGH (ref 11.2–14.5)
WBC: 6.9 10e3/uL (ref 3.9–10.3)
lymph#: 0.4 10e3/uL — ABNORMAL LOW (ref 0.9–3.3)
nRBC: 0 % (ref 0–0)

## 2012-05-30 LAB — COMPREHENSIVE METABOLIC PANEL (CC13)
ALT: 25 U/L (ref 0–55)
CO2: 26 mEq/L (ref 22–29)
Calcium: 9.1 mg/dL (ref 8.4–10.4)
Chloride: 102 mEq/L (ref 98–107)
Potassium: 4.1 mEq/L (ref 3.5–5.1)
Sodium: 138 mEq/L (ref 136–145)
Total Protein: 6.5 g/dL (ref 6.4–8.3)

## 2012-05-30 LAB — UA PROTEIN, DIPSTICK - CHCC: Protein, ur: <30 mg/dL

## 2012-05-30 MED ORDER — SODIUM CHLORIDE 0.9 % IV SOLN: Freq: Once | INTRAVENOUS | Status: DC

## 2012-05-30 MED ORDER — SODIUM CHLORIDE 0.9 % IV SOLN
1000.0000 mg | Freq: Once | INTRAVENOUS | Status: AC
  Administered 2012-05-30: 1000 mg via INTRAVENOUS
  Filled 2012-05-30: qty 40

## 2012-05-30 NOTE — Telephone Encounter (Signed)
appts made and printed for pt,pt aware that she will get a call From sch. With her scan appt

## 2012-05-30 NOTE — Patient Instructions (Addendum)
Follow up with Dr. Arbutus Ped in 3 weeks with restaging CT scans of your chest, abdomen and pelvis to re-evaluate your disease

## 2012-05-30 NOTE — Telephone Encounter (Signed)
Per staff message and POF I have scheduled appt.  JMW  

## 2012-05-30 NOTE — Patient Instructions (Addendum)
[facility] Discharge Instructions for Patients Receiving Chemotherapy  Today you received the following chemotherapy agents avastin.  To help prevent nausea and vomiting after your treatment, we encourage you to take your nausea medication as prescribed Begin taking it as directed and take it as often as prescribed for the next 48-72 hours as needed.   If you develop nausea and vomiting that is not controlled by your nausea medication, call the clinic. If it is after clinic hours your family physician or the after hours number for the clinic or go to the Emergency Department.   BELOW ARE SYMPTOMS THAT SHOULD BE REPORTED IMMEDIATELY:  *FEVER GREATER THAN 100.5 F  *CHILLS WITH OR WITHOUT FEVER  NAUSEA AND VOMITING THAT IS NOT CONTROLLED WITH YOUR NAUSEA MEDICATION  *UNUSUAL SHORTNESS OF BREATH  *UNUSUAL BRUISING OR BLEEDING  TENDERNESS IN MOUTH AND THROAT WITH OR WITHOUT PRESENCE OF ULCERS  *URINARY PROBLEMS  *BOWEL PROBLEMS  UNUSUAL RASH Items with * indicate a potential emergency and should be followed up as soon as possible.  One of the nurses will contact you 24 hours after your treatment. Please let the nurse know about any problems that you may have experienced. Feel free to call the clinic you have any questions or concerns. The clinic phone number is [phone].   I have been informed and understand all the instructions given to me. I know to contact the clinic, my physician, or go to the Emergency Department if any problems should occur. I do not have any questions at this time, but understand that I may call the clinic during office hours   should I have any questions or need assistance in obtaining follow up care.    __________________________________________  _____________  __________ Signature of Patient or Authorized Representative            Date                   Time    __________________________________________ Nurse's Signature

## 2012-05-31 ENCOUNTER — Other Ambulatory Visit: Payer: Self-pay | Admitting: Certified Registered Nurse Anesthetist

## 2012-06-02 NOTE — Progress Notes (Signed)
[facility] [phone]   Fax:(336) [phone]  OFFICE PROGRESS NOTE  [name], MD [address] [facility], [place]. [place] [address]  DIAGNOSIS: Recurrent non-small cell lung cancer initially diagnosed as stage IIIA in (T1b N2 M0) in July 2012.   PRIOR THERAPY:  #1 Status post concurrent chemoradiation with weekly carboplatin and paclitaxel, last dose was given 03/01/2011.  #2 status post 3 seconds of consolidation chemotherapy was carboplatin for AUC of 5 and Alimta 500 mg/M2 giving him to see weeks, last dose was given 05/27/2011.  # 3 systemic chemotherapy with carboplatin for AUC of 5, paclitaxel 175 mg/M2 and Avastin 15 mg/kg every 3 weeks. Status post 6 cycles   CURRENT THERAPY: maintenance Avastin 15 mg/kg every 3 weeks, status post 5 cycles  INTERVAL HISTORY: [name] 78 y.o. female returns to the clinic today for followup visit accompanied by her husband. Today she is feeling well and voices no specific complaints. She still has not had her shave biopsies done by her dermatologist and wants to clarify the timing related to her Avastin treatments. She's had no bleeding. She denied having any significant chest pain, shortness breath, cough or hemoptysis. She denied having any significant weight loss or night sweats. The patient has no nausea or vomiting, no fever or chills. She is tolerating her maintenance treatment with Avastin fairly well with no significant adverse effects.   MEDICAL HISTORY: Past Medical History  Diagnosis Date  . Lung mass   . History of radiation therapy 7/31/212-03/08/2011    right upper lobe lung adenocarcinoma  . COPD (chronic obstructive pulmonary disease)   . Hypertension   . Hypercholesterolemia   . Osteoporosis   . History of chemotherapy     carboplatin/paclitaxel  . Cancer July 2012    , right upper lobe    ALLERGIES:  is allergic to aleve and zyban.  MEDICATIONS:  Current  Outpatient Prescriptions  Medication Sig Dispense Refill  . docusate sodium (COLACE) 100 MG capsule Take 100 mg by mouth 2 (two) times daily as needed.        . lidocaine-prilocaine (EMLA) cream Apply topically as needed.  30 g  1  . lidocaine-prilocaine (EMLA) cream Apply topically as needed. Use as directed  30 g  1  . losartan-hydrochlorothiazide (HYZAAR) 50-12.5 MG per tablet Take 1 tablet by mouth daily. Takes 1/2 tablet by mouth daily      . polyethylene glycol (MIRALAX / GLYCOLAX) packet Take 17 g by mouth daily.      . prochlorperazine (COMPAZINE) 10 MG tablet Take 1 tablet (10 mg total) by mouth every 6 (six) hours as needed.  30 tablet  1  . Thiamine HCl (VITAMIN B-1 PO) Take by mouth daily.      . traMADol (ULTRAM) 50 MG tablet Take 1 tablet (50 mg total) by mouth every 6 (six) hours as needed. Maximum dose= 8 tablets per day  60 tablet  0    SURGICAL HISTORY:  Past Surgical History  Procedure Date  . Abdominal hysterectomy     s/p for fibroid tumor  . Portacath placement     right ij     REVIEW OF SYSTEMS:  A comprehensive review of systems was negative.   PHYSICAL EXAMINATION: General appearance: alert, cooperative and no distress Head: Normocephalic, without obvious abnormality, atraumatic Neck: no adenopathy Lymph nodes: Cervical, supraclavicular, and axillary nodes normal. Resp: clear to auscultation bilaterally Cardio: regular rate and rhythm, S1,  S2 normal, no murmur, click, rub or gallop GI: soft, non-tender; bowel sounds normal; no masses,  no organomegaly Extremities: extremities normal, atraumatic, no cyanosis or edema Neurologic: Alert and oriented X 3, normal strength and tone. Normal symmetric reflexes. Normal coordination and gait  ECOG PERFORMANCE STATUS: 1 - Symptomatic but completely ambulatory  Blood pressure 123/73, pulse 84, temperature 96.9 F (36.1 C), temperature source Oral, resp. rate 18, height 5' 4.5" (1.638 m), weight 144 lb 1.6 oz (65.363  kg).  LABORATORY DATA: Lab Results  Component Value Date   WBC 6.9 05/30/2012   HGB 14.7 05/30/2012   HCT 44.5 05/30/2012   MCV 91.2 05/30/2012   PLT 166 05/30/2012      Chemistry      Component Value Date/Time   NA 138 05/30/2012 1033   NA 136 01/25/2012 1137   NA 142 09/16/2011 0917   K 4.1 05/30/2012 1033   K 4.1 01/25/2012 1137   K 3.7 09/16/2011 0917   CL 102 05/30/2012 1033   CL 109 01/25/2012 1137   CL 106 09/16/2011 0917   CO2 26 05/30/2012 1033   CO2 23 01/25/2012 1137   CO2 29 09/16/2011 0917   BUN 16.0 05/30/2012 1033   BUN 12 01/25/2012 1137   BUN 16 09/16/2011 0917   CREATININE 0.9 05/30/2012 1033   CREATININE 0.62 01/25/2012 1137   CREATININE 0.8 09/16/2011 0917      Component Value Date/Time   CALCIUM 9.1 05/30/2012 1033   CALCIUM 9.3 01/25/2012 1137   CALCIUM 8.3 09/16/2011 0917   ALKPHOS 184* 05/30/2012 1033   ALKPHOS 51 01/25/2012 1137   ALKPHOS 62 09/16/2011 0917   AST 27 05/30/2012 1033   AST 13 01/25/2012 1137   AST 18 09/16/2011 0917   ALT 25 05/30/2012 1033   ALT 14 01/25/2012 1137   BILITOT 1.16 05/30/2012 1033   BILITOT 0.5 01/25/2012 1137   BILITOT 0.60 09/16/2011 0917       RADIOGRAPHIC STUDIES: Ct Abdomen Pelvis W Contrast  03/24/2012  *RADIOLOGY REPORT*  Clinical Data:  Lung cancer, restaging.  CT CHEST, ABDOMEN AND PELVIS WITH CONTRAST  Technique:  Multidetector CT imaging of the chest, abdomen and pelvis was performed following the standard protocol during bolus administration of intravenous contrast.  Contrast: OMNIPAQUE IOHEXOL 300 MG/ML  SOLN  Comparison:  01/21/2012  CT CHEST  Findings: No axillary or supraclavicular adenopathy.  No mediastinal or hilar adenopathy identified.  No pericardial or pleural effusion. Calcifications involving the LAD coronary artery noted.  Paramediastinal radiation changes identified within the right lung. Within the medial aspect of the right lower lobe there is a paramediastinal area of nodular, consolidation measuring 1.5  x 1.9 cm, image 27.  This is stable when compared with the previous exam. Within the anterior right upper lobe there is a focal area of paramediastinal consolidation/soft tissue measuring 2.7 x 1.1 cm, image 27.  This is also unchanged when compared with previous examination.  Again identified are multiple small pulmonary nodules in both lungs consistent with metastatic disease.  Index nodule in the right lower lobe measures 3.5 mm, image 43.  Unchanged from previous exam.  Slightly more anterior is a 5.7 mm nodule, image 43.  This is compared with 3.8 mm previously.  Right middle lobe nodule measures 6.4 mm, image 26.  Stable from previous exam.  Nodule within the left lower lobe measures 4.7 mm, image 23.  Previously this measured 4.5 mm.  Review of the visualized osseous structures  shows no aggressive lytic or sclerotic bone lesions.  IMPRESSION:  1.  Similar appearance of post radiation changes in the right hemithorax. 2.  No significant change in multiple tiny nodules compared with previous exam consistent with metastatic disease.  CT ABDOMEN AND PELVIS  Findings:  4.7 mm hypodensity in the left hepatic lobe is identified, image 48.  Unchanged from previous exam, image 48. Several small stones noted within the gallbladder fundus.  No biliary dilatation.  The pancreas is normal.  The spleen is unremarkable.  Both adrenal glands are normal.  Normal appearance of the right kidney.  The left kidney is normal.  The urinary bladder appears within normal limits.  No adenopathy within the upper abdomen.  No pelvic or inguinal adenopathy.  The there is calcified atherosclerotic disease affecting the abdominal aorta and its branches.  The patient is status post femoral to femoral bypass grafting.  The stomach and the small bowel loops are normal.  The proximal colon is unremarkable.  Multiple sigmoid diverticula without acute inflammation.  Review of the visualized osseous structures is significant for lumbar  degenerative disc disease and facet degenerative change. There is ankylosis of the L4 and L5 vertebra.  IMPRESSION:  1.  No acute findings identified within the abdomen or pelvis. 2.  No evidence for metastatic disease.   Original Report Authenticated By: Rosealee Albee, M.D.     ASSESSMENT/PLAN: This is a very pleasant 78 years old white female with metastatic non-small cell lung cancer, adenocarcinoma currently on maintenance treatment with Avastin status post 5 cycles. The patient is tolerating her treatment fairly well and she has no evidence for disease progression. Patient was discussed with Dr. Arbutus Ped. She'll proceed with cycle #6 of maintenance Avastin today as scheduled. She'll followup with Dr. Arbutus Ped in 3 weeks with repeat CBC differential, C. met and CT of the chest abdomen and pelvis with contrast to reevaluate her disease. In regards to the shave biopsies needed from her dermatologist she may have these done 1-2 weeks after her Avastin treatment. Both patient and her husband voiced understanding.   Laural Benes, Betul Brisky E, PA-C   All questions were answered. The patient knows to call the clinic with any problems, questions or concerns. We can certainly see the patient much sooner if necessary.

## 2012-06-09 ENCOUNTER — Telehealth: Payer: Self-pay | Admitting: Medical Oncology

## 2012-06-09 NOTE — Telephone Encounter (Signed)
Called to see if pt requested tramadol refill- her pharmacist said not today , her last refill was 05/23/12

## 2012-06-16 ENCOUNTER — Ambulatory Visit (HOSPITAL_COMMUNITY): Payer: Medicare Other

## 2012-06-16 ENCOUNTER — Ambulatory Visit (HOSPITAL_COMMUNITY)
Admission: RE | Admit: 2012-06-16 | Discharge: 2012-06-16 | Disposition: A | Discharge status: Home / Self Care | Payer: Medicare Other | Source: Ambulatory Visit | Attending: Physician Assistant | Admitting: Physician Assistant

## 2012-06-16 DIAGNOSIS — C3490 Malignant neoplasm of unspecified part of unspecified bronchus or lung: Secondary | ICD-10-CM

## 2012-06-16 DIAGNOSIS — J90 Pleural effusion, not elsewhere classified: Secondary | ICD-10-CM | POA: Insufficient documentation

## 2012-06-16 MED ORDER — IOHEXOL 300 MG/ML  SOLN
100.0000 mL | Freq: Once | INTRAMUSCULAR | Status: AC | PRN
  Administered 2012-06-16: 100 mL via INTRAVENOUS

## 2012-06-20 ENCOUNTER — Ambulatory Visit: Payer: Medicare Other

## 2012-06-20 ENCOUNTER — Ambulatory Visit (HOSPITAL_BASED_OUTPATIENT_CLINIC_OR_DEPARTMENT_OTHER): Payer: Medicare Other | Admitting: Internal Medicine

## 2012-06-20 ENCOUNTER — Other Ambulatory Visit (HOSPITAL_BASED_OUTPATIENT_CLINIC_OR_DEPARTMENT_OTHER): Payer: Medicare Other | Admitting: Lab

## 2012-06-20 ENCOUNTER — Telehealth: Payer: Self-pay | Admitting: Internal Medicine

## 2012-06-20 ENCOUNTER — Encounter: Payer: Self-pay | Admitting: Internal Medicine

## 2012-06-20 VITALS — BP 127/79 | HR 101 | Temp 98.0°F | Resp 18 | Ht 64.5 in | Wt 143.0 lb

## 2012-06-20 DIAGNOSIS — I10 Essential (primary) hypertension: Secondary | ICD-10-CM

## 2012-06-20 DIAGNOSIS — C3490 Malignant neoplasm of unspecified part of unspecified bronchus or lung: Secondary | ICD-10-CM

## 2012-06-20 DIAGNOSIS — C3410 Malignant neoplasm of upper lobe, unspecified bronchus or lung: Secondary | ICD-10-CM

## 2012-06-20 DIAGNOSIS — K7689 Other specified diseases of liver: Secondary | ICD-10-CM

## 2012-06-20 LAB — CBC WITH DIFFERENTIAL/PLATELET
BASO%: 0.6 % (ref 0.0–2.0)
EOS%: 10.3 % — ABNORMAL HIGH (ref 0.0–7.0)
LYMPH%: 8.6 % — ABNORMAL LOW (ref 14.0–49.7)
MCHC: 33.3 g/dL (ref 31.5–36.0)
MONO#: 0.4 10*3/uL (ref 0.1–0.9)
Platelets: 131 10*3/uL — ABNORMAL LOW (ref 145–400)
RBC: 4.76 10*6/uL (ref 3.70–5.45)
WBC: 7.2 10*3/uL (ref 3.9–10.3)
nRBC: 0 % (ref 0–0)

## 2012-06-20 NOTE — Progress Notes (Signed)
[facility] Telephone:(336) [phone]   Fax:(336) [phone]  OFFICE PROGRESS NOTE  Monique Baars, MD [address] [facility], [place]. [place] [address]  DIAGNOSIS: Recurrent non-small cell lung cancer, adenocarcinoma with positive EGFR mutation in exon 19 and negative ALK gene translocation initially diagnosed as stage IIIA in (T1b N2 M0) in July 2012.   PRIOR THERAPY:  #1 Status post concurrent chemoradiation with weekly carboplatin and paclitaxel, last dose was given 03/01/2011.  #2 status post 3 seconds of consolidation chemotherapy was carboplatin for AUC of 5 and Alimta 500 mg/M2 giving him to see weeks, last dose was given 05/27/2011.  # 3 systemic chemotherapy with carboplatin for AUC of 5, paclitaxel 175 mg/M2 and Avastin 15 mg/kg every 3 weeks. Status post 6 cycles. #4 maintenance therapy with the Avastin 15 mg/kg every 3 weeks, status post 6 cycles, last dose was given on 06/02/2012 discontinued today secondary to disease progression.   CURRENT THERAPY: The patient will start in the next few days treatment with Gilotrif (Afatinib) 40 mg by mouth daily.  INTERVAL HISTORY: [name] 78 y.o. female returns to the clinic today for followup visit accompanied her husband. The patient is feeling fine today with no specific complaints. She tolerated the previous treatment with Avastin fairly well with no significant adverse effects. She denied having any bleeding issues. She denied having any chest pain, shortness breath, cough or hemoptysis. The patient denied having any significant weight loss or night sweats. She had repeat CT scan of the chest, abdomen and pelvis performed recently and she is here for evaluation and discussion of her scan results.  MEDICAL HISTORY: Past Medical History  Diagnosis Date  . Lung mass   . History of radiation therapy 7/31/212-03/08/2011    right upper lobe lung adenocarcinoma  . COPD (chronic obstructive  pulmonary disease)   . Hypertension   . Hypercholesterolemia   . Osteoporosis   . History of chemotherapy     carboplatin/paclitaxel  . Cancer July 2012    , right upper lobe    ALLERGIES:  is allergic to aleve and zyban.  MEDICATIONS:  Current Outpatient Prescriptions  Medication Sig Dispense Refill  . docusate sodium (COLACE) 100 MG capsule Take 100 mg by mouth 2 (two) times daily as needed.        . lidocaine-prilocaine (EMLA) cream Apply topically as needed.  30 g  1  . lidocaine-prilocaine (EMLA) cream Apply topically as needed. Use as directed  30 g  1  . losartan-hydrochlorothiazide (HYZAAR) 50-12.5 MG per tablet Take 1 tablet by mouth daily. Takes 1/2 tablet by mouth daily      . phenylephrine (SUDAFED PE) 10 MG TABS Take 10 mg by mouth every 4 (four) hours as needed.      . polyethylene glycol (MIRALAX / GLYCOLAX) packet Take 17 g by mouth daily.      . prochlorperazine (COMPAZINE) 10 MG tablet Take 1 tablet (10 mg total) by mouth every 6 (six) hours as needed.  30 tablet  1  . Thiamine HCl (VITAMIN B-1 PO) Take by mouth daily.      . traMADol (ULTRAM) 50 MG tablet Take 1 tablet (50 mg total) by mouth every 6 (six) hours as needed. Maximum dose= 8 tablets per day  60 tablet  0    SURGICAL HISTORY:  Past Surgical History  Procedure Date  . Abdominal hysterectomy     s/p for fibroid tumor  . Portacath placement  right ij     REVIEW OF SYSTEMS:  A comprehensive review of systems was negative.   PHYSICAL EXAMINATION: General appearance: alert, cooperative and no distress Head: Normocephalic, without obvious abnormality, atraumatic Neck: no adenopathy Lymph nodes: Cervical, supraclavicular, and axillary nodes normal. Resp: clear to auscultation bilaterally Cardio: regular rate and rhythm, S1, S2 normal, no murmur, click, rub or gallop GI: soft, non-tender; bowel sounds normal; no masses,  no organomegaly Extremities: extremities normal, atraumatic, no cyanosis or  edema Neurologic: Alert and oriented X 3, normal strength and tone. Normal symmetric reflexes. Normal coordination and gait  ECOG PERFORMANCE STATUS: 0 - Asymptomatic  Blood pressure 127/79, pulse 101, temperature 98 F (36.7 C), temperature source Oral, resp. rate 18, height 5' 4.5" (1.638 m), weight 143 lb (64.864 kg).  LABORATORY DATA: Lab Results  Component Value Date   WBC 7.2 06/20/2012   HGB 14.3 06/20/2012   HCT 42.9 06/20/2012   MCV 90.1 06/20/2012   PLT 131* 06/20/2012      Chemistry      Component Value Date/Time   NA 138 05/30/2012 1033   NA 136 01/25/2012 1137   NA 142 09/16/2011 0917   K 4.1 05/30/2012 1033   K 4.1 01/25/2012 1137   K 3.7 09/16/2011 0917   CL 102 05/30/2012 1033   CL 109 01/25/2012 1137   CL 106 09/16/2011 0917   CO2 26 05/30/2012 1033   CO2 23 01/25/2012 1137   CO2 29 09/16/2011 0917   BUN 16.0 05/30/2012 1033   BUN 12 01/25/2012 1137   BUN 16 09/16/2011 0917   CREATININE 0.9 05/30/2012 1033   CREATININE 0.62 01/25/2012 1137   CREATININE 0.8 09/16/2011 0917      Component Value Date/Time   CALCIUM 9.1 05/30/2012 1033   CALCIUM 9.3 01/25/2012 1137   CALCIUM 8.3 09/16/2011 0917   ALKPHOS 184* 05/30/2012 1033   ALKPHOS 51 01/25/2012 1137   ALKPHOS 62 09/16/2011 0917   AST 27 05/30/2012 1033   AST 13 01/25/2012 1137   AST 18 09/16/2011 0917   ALT 25 05/30/2012 1033   ALT 14 01/25/2012 1137   BILITOT 1.16 05/30/2012 1033   BILITOT 0.5 01/25/2012 1137   BILITOT 0.60 09/16/2011 0917       RADIOGRAPHIC STUDIES: Ct Chest W Contrast  06/16/2012  *RADIOLOGY REPORT*  Clinical Data:  Recurrent non-small cell lung carcinoma initially diagnosed is stage IIIA in July 2012.  CT CHEST, ABDOMEN AND PELVIS WITH CONTRAST  Technique:  Multidetector CT imaging of the chest, abdomen and pelvis was performed following the standard protocol during bolus administration of intravenous contrast.  Contrast: OMNIPAQUE IOHEXOL 300 MG/ML  SOLN  Comparison:  03/24/2012   CT CHEST   Findings:  Right-sided Port-A-Cath remains in place with tip positioned at the mid to distal SVC level.  Tiny filling defect associated with the catheter end is compatible with some minimal fibrin sheath or adherent clot. The catheter tip has pulled back slightly in the interval since the prior exam  There is no axillary lymphadenopathy.  Tiny bilateral thyroid nodules are stable.  Postradiation change is noted in the medial right lung with interval increase in size of a soft tissue attenuating component along the medial pleura, adjacent to the aortic root.  This soft tissue measures 1.7 x 2.9 cm today compared to 1.1 x 2.7 cm previously.  Posterior to the right hilum is another nodular soft tissue component which has become more confluent and has increased slightly in  size in the interval.  This measures 1.7 x 2.0 cm today compared to 1.5 x 1.9 cm previously.  A small right pleural effusion is new in the interval.  Soft tissue attenuation in the right hilum is stable.  There is no discrete measurable abnormal lymph node in the mediastinum or left hilum on today's study.  Heart size is normal.  No pericardial effusion. Coronary artery calcification is noted.  Lung windows show interval development of substantial interstitial prominence in the right upper lobe.  Scattered bilateral pulmonary parenchymal nodules persist.  Some of these have increased (7 mm posterior left lower lobe pulmonary nodule on image 38 measured 4 mm previously).  Multiple new tiny bilateral pulmonary parenchymal nodules are evident (see the posterior left lower lobe on image 31, for example).  IMPRESSION:  Interval increase in size of the two nodular components contained within the post radiation change in the medial right lung.  These imaging features are suspicious for disease progression, especially in light of the findings reported below.  Interval diffuse increase in interstitial opacity involving the right upper lobe and to a lesser  degree, the right lower lobe. This could be secondary to central obstruction from disease progression or be related to lymphangitic tumor spread.  Interval progression of numerous tiny bilateral pulmonary parenchymal nodules with development of new tiny nodules in the interval.  These features are highly suspicious for disease progression.   CT ABDOMEN AND PELVIS  Findings:  Since the prior study, there is decreased perfusion and some volume loss involving the lateral segment of the left liver. The left portal vein is markedly attenuated and appears irregular, suggesting metastatic involvement.  It is unclear from these images whether the metastatic disease is merely compressing the left portal vein or invading it.  A new 9 mm hypoattenuating lesion is seen in the posterior right liver.  Other scattered very small low-density lesions are seen elsewhere through the liver parenchyma.  The spleen, stomach, duodenum, pancreas, and adrenal glands are unremarkable.  The gallbladder is decompressed without a tiny calcified stones seen in the fundus.  Central sinus cysts are noted in the kidneys bilaterally.  No abdominal aortic aneurysm.  A new left para-aortic 2.1 x 1.2 cm lymph node is seen on image 73.  Multiple small aorto caval lymph nodes in the retroperitoneal space of the abdomen are also new.  No intraperitoneal free fluid or bowel obstruction.  Imaging through the pelvis shows no free intraperitoneal fluid.  A fem-fem bypass graft is noted and opacifies throughout its length. Multiple new common iliac lymph nodes are noted bilaterally. Although these do not meet CT criteria for pathologic enlargement, there interval appearance is concerning.  Uterus is surgically absent.  There is no adnexal mass. Diverticular changes are seen in the left colon without diverticulitis.  Terminal ileum is normal. The appendix is not visualized, but there is no edema or inflammation in the region of the cecum.  Bone windows reveal  no worrisome lytic or sclerotic osseous lesions.  IMPRESSION: Interval development of multiple small low-density liver lesions concerning for metastatic involvement.  There is abnormal perfusion to the lateral segment the left liver with fairly marked attenuation of the left portal vein, suggesting metastatic involvement of this vascular structure.  The main portal vein and right portal vein remain patent.  Interval development of retroperitoneal lymphadenopathy, consistent with metastatic disease.   Original Report Authenticated By: Kennith Center, M.D.     ASSESSMENT: This is a very pleasant 78  years old white female with recurrent non-small cell lung cancer, adenocarcinoma with positive EGFR mutation in exon 19 and negative ALK gene translocation. Unfortunately she has evidence for disease progression on his recent CT scan of the chest, abdomen and pelvis with new small liver lesions.  PLAN: I discussed the scan results and showed the images to the patient and her husband today. I look at her previous surgery resection final pathology report and I found that the patient has positive EGFR mutation in exon 19. I would consider the patient for treatment with Gilotrif (Afatinib) 40 mg by mouth daily. She would come back in a few days for education before starting this treatment and also for more detailed discussion of the adverse effect of this targeted agent.  All questions were answered. The patient knows to call the clinic with any problems, questions or concerns. We can certainly see the patient much sooner if necessary.  I spent 15 minutes counseling the patient face to face. The total time spent in the appointment was 25 minutes.

## 2012-06-20 NOTE — Telephone Encounter (Signed)
gv and printed appt schedule for pt for Jan 2014. °

## 2012-06-20 NOTE — Patient Instructions (Signed)
Your scan showed evidence for disease progression. I would consider starting on treatment with Gilotrif (Afatinib) 40 mg by mouth daily as your surgical resection specimen showed positive EGFR mutation.

## 2012-06-22 ENCOUNTER — Other Ambulatory Visit: Payer: Self-pay | Admitting: *Deleted

## 2012-06-22 ENCOUNTER — Ambulatory Visit (HOSPITAL_BASED_OUTPATIENT_CLINIC_OR_DEPARTMENT_OTHER): Payer: Medicare Other | Admitting: Internal Medicine

## 2012-06-22 ENCOUNTER — Encounter: Payer: Self-pay | Admitting: Internal Medicine

## 2012-06-22 VITALS — BP 127/75 | HR 106 | Temp 97.9°F | Resp 20 | Ht 64.5 in | Wt 141.0 lb

## 2012-06-22 DIAGNOSIS — C3410 Malignant neoplasm of upper lobe, unspecified bronchus or lung: Secondary | ICD-10-CM

## 2012-06-22 DIAGNOSIS — C3490 Malignant neoplasm of unspecified part of unspecified bronchus or lung: Secondary | ICD-10-CM

## 2012-06-22 MED ORDER — AFATINIB DIMALEATE 40 MG PO TABS: 40.0000 mg | ORAL_TABLET | Freq: Every day | ORAL | Status: DC

## 2012-06-23 ENCOUNTER — Telehealth: Payer: Self-pay | Admitting: *Deleted

## 2012-06-23 ENCOUNTER — Other Ambulatory Visit: Payer: Self-pay | Admitting: *Deleted

## 2012-06-23 NOTE — Telephone Encounter (Signed)
Rx for afatinib ordered 06/22/12.  Informed pt to call us when she had prescription so we can change her f/u appt so she will have had medication in system for approx 2 weeks.  Pt verbalized understanding.  Will also begin scheduling pt for port flushes x 6 weeks.  Onc tx schedule filled out.  SLJ

## 2012-06-24 NOTE — Progress Notes (Signed)
[facility] [phone]   Fax:([phone]  OFFICE PROGRESS NOTE  [name], MD [address] [facility], [place]. [place] [address]  DIAGNOSIS: Recurrent non-small cell lung cancer, adenocarcinoma with positive EGFR mutation in exon 19 and negative ALK gene translocation initially diagnosed as stage IIIA in (T1b N2 M0) in July 2012.   PRIOR THERAPY:  #1 Status post concurrent chemoradiation with weekly carboplatin and paclitaxel, last dose was given 03/01/2011.  #2 status post 3 seconds of consolidation chemotherapy was carboplatin for AUC of 5 and Alimta 500 mg/M2 giving him to see weeks, last dose was given 05/27/2011.  # 3 systemic chemotherapy with carboplatin for AUC of 5, paclitaxel 175 mg/M2 and Avastin 15 mg/kg every 3 weeks. Status post 6 cycles.  #4 maintenance therapy with the Avastin 15 mg/kg every 3 weeks, status post 6 cycles, last dose was given on 06/02/2012 discontinued today secondary to disease progression.   CURRENT THERAPY: The patient will start in the next few days treatment with Gilotrif (Afatinib) 40 mg by mouth daily.  INTERVAL HISTORY: [name] 79 y.o. female returns to the clinic today for followup visit accompanied by her husband and daughter. The patient has no complaints today. She was found to have both the EGFR mutation in exon 19 and I consider the patient for treatment with Gilotrif. She is here today for evaluation and more detailed discussion of this treatment options. She denied having any significant chest pain or shortness breath, no cough or hemoptysis. She denied having any significant weight loss or night sweats. She has no nausea or vomiting.  MEDICAL HISTORY: Past Medical History  Diagnosis Date  . Lung mass   . History of radiation therapy 7/31/212-03/08/2011    right upper lobe lung adenocarcinoma  . COPD (chronic obstructive pulmonary disease)   . Hypertension   .  Hypercholesterolemia   . Osteoporosis   . History of chemotherapy     carboplatin/paclitaxel  . Cancer July 2012    , right upper lobe    ALLERGIES:  is allergic to aleve and zyban.  MEDICATIONS:  Current Outpatient Prescriptions  Medication Sig Dispense Refill  . docusate sodium (COLACE) 100 MG capsule Take 100 mg by mouth 2 (two) times daily as needed.        . lidocaine-prilocaine (EMLA) cream Apply topically as needed.  30 g  1  . lidocaine-prilocaine (EMLA) cream Apply topically as needed. Use as directed  30 g  1  . losartan-hydrochlorothiazide (HYZAAR) 50-12.5 MG per tablet Take 1 tablet by mouth daily. Takes 1/2 tablet by mouth daily      . phenylephrine (SUDAFED PE) 10 MG TABS Take 10 mg by mouth every 4 (four) hours as needed.      . polyethylene glycol (MIRALAX / GLYCOLAX) packet Take 17 g by mouth daily.      . prochlorperazine (COMPAZINE) 10 MG tablet Take 1 tablet (10 mg total) by mouth every 6 (six) hours as needed.  30 tablet  1  . Thiamine HCl (VITAMIN B-1 PO) Take by mouth daily.      . traMADol (ULTRAM) 50 MG tablet Take 1 tablet (50 mg total) by mouth every 6 (six) hours as needed. Maximum dose= 8 tablets per day  60 tablet  0  . afatinib dimaleate (GILOTRIF) 40 MG tablet Take 1 tablet (40 mg total) by mouth daily. Take on an empty stomach 1hr before or 2 hrs after meals.  30 tablet  2    SURGICAL HISTORY:  Past Surgical History  Procedure Date  . Abdominal hysterectomy     s/p for fibroid tumor  . Portacath placement     right ij     REVIEW OF SYSTEMS:  A comprehensive review of systems was negative.   PHYSICAL EXAMINATION: General appearance: alert, cooperative and no distress Head: Normocephalic, without obvious abnormality, atraumatic Neck: no adenopathy Lymph nodes: Cervical, supraclavicular, and axillary nodes normal. Resp: clear to auscultation bilaterally Cardio: regular rate and rhythm, S1, S2 normal, no murmur, click, rub or gallop GI: soft,  non-tender; bowel sounds normal; no masses,  no organomegaly Extremities: extremities normal, atraumatic, no cyanosis or edema Neurologic: Alert and oriented X 3, normal strength and tone. Normal symmetric reflexes. Normal coordination and gait  ECOG PERFORMANCE STATUS: 1 - Symptomatic but completely ambulatory  Blood pressure 127/75, pulse 106, temperature 97.9 F (36.6 C), temperature source Oral, resp. rate 20, height 5' 4.5" (1.638 m), weight 141 lb (63.957 kg).  LABORATORY DATA: Lab Results  Component Value Date   WBC 7.2 06/20/2012   HGB 14.3 06/20/2012   HCT 42.9 06/20/2012   MCV 90.1 06/20/2012   PLT 131* 06/20/2012      Chemistry      Component Value Date/Time   NA 138 05/30/2012 1033   NA 136 01/25/2012 1137   NA 142 09/16/2011 0917   K 4.1 05/30/2012 1033   K 4.1 01/25/2012 1137   K 3.7 09/16/2011 0917   CL 102 05/30/2012 1033   CL 109 01/25/2012 1137   CL 106 09/16/2011 0917   CO2 26 05/30/2012 1033   CO2 23 01/25/2012 1137   CO2 29 09/16/2011 0917   BUN 16.0 05/30/2012 1033   BUN 12 01/25/2012 1137   BUN 16 09/16/2011 0917   CREATININE 0.9 05/30/2012 1033   CREATININE 0.62 01/25/2012 1137   CREATININE 0.8 09/16/2011 0917      Component Value Date/Time   CALCIUM 9.1 05/30/2012 1033   CALCIUM 9.3 01/25/2012 1137   CALCIUM 8.3 09/16/2011 0917   ALKPHOS 184* 05/30/2012 1033   ALKPHOS 51 01/25/2012 1137   ALKPHOS 62 09/16/2011 0917   AST 27 05/30/2012 1033   AST 13 01/25/2012 1137   AST 18 09/16/2011 0917   ALT 25 05/30/2012 1033   ALT 14 01/25/2012 1137   BILITOT 1.16 05/30/2012 1033   BILITOT 0.5 01/25/2012 1137   BILITOT 0.60 09/16/2011 0917       RADIOGRAPHIC STUDIES: Ct Chest W Contrast  06/16/2012  *RADIOLOGY REPORT*  Clinical Data:  Recurrent non-small cell lung carcinoma initially diagnosed is stage IIIA in July 2012.  CT CHEST, ABDOMEN AND PELVIS WITH CONTRAST  Technique:  Multidetector CT imaging of the chest, abdomen and pelvis was performed following the standard  protocol during bolus administration of intravenous contrast.  Contrast: OMNIPAQUE IOHEXOL 300 MG/ML  SOLN  Comparison:  03/24/2012   CT CHEST  Findings:  Right-sided Port-A-Cath remains in place with tip positioned at the mid to distal SVC level.  Tiny filling defect associated with the catheter end is compatible with some minimal fibrin sheath or adherent clot. The catheter tip has pulled back slightly in the interval since the prior exam  There is no axillary lymphadenopathy.  Tiny bilateral thyroid nodules are stable.  Postradiation change is noted in the medial right lung with interval increase in size of a soft tissue attenuating component along the medial pleura, adjacent to the aortic root.  This soft tissue measures 1.7 x 2.9 cm today compared to 1.1 x 2.7 cm previously.  Posterior to the right hilum is another nodular soft tissue component which has become more confluent and has increased slightly in size in the interval.  This measures 1.7 x 2.0 cm today compared to 1.5 x 1.9 cm previously.  A small right pleural effusion is new in the interval.  Soft tissue attenuation in the right hilum is stable.  There is no discrete measurable abnormal lymph node in the mediastinum or left hilum on today's study.  Heart size is normal.  No pericardial effusion. Coronary artery calcification is noted.  Lung windows show interval development of substantial interstitial prominence in the right upper lobe.  Scattered bilateral pulmonary parenchymal nodules persist.  Some of these have increased (7 mm posterior left lower lobe pulmonary nodule on image 38 measured 4 mm previously).  Multiple new tiny bilateral pulmonary parenchymal nodules are evident (see the posterior left lower lobe on image 31, for example).  IMPRESSION:  Interval increase in size of the two nodular components contained within the post radiation change in the medial right lung.  These imaging features are suspicious for disease progression,  especially in light of the findings reported below.  Interval diffuse increase in interstitial opacity involving the right upper lobe and to a lesser degree, the right lower lobe. This could be secondary to central obstruction from disease progression or be related to lymphangitic tumor spread.  Interval progression of numerous tiny bilateral pulmonary parenchymal nodules with development of new tiny nodules in the interval.  These features are highly suspicious for disease progression.   CT ABDOMEN AND PELVIS  Findings:  Since the prior study, there is decreased perfusion and some volume loss involving the lateral segment of the left liver. The left portal vein is markedly attenuated and appears irregular, suggesting metastatic involvement.  It is unclear from these images whether the metastatic disease is merely compressing the left portal vein or invading it.  A new 9 mm hypoattenuating lesion is seen in the posterior right liver.  Other scattered very small low-density lesions are seen elsewhere through the liver parenchyma.  The spleen, stomach, duodenum, pancreas, and adrenal glands are unremarkable.  The gallbladder is decompressed without a tiny calcified stones seen in the fundus.  Central sinus cysts are noted in the kidneys bilaterally.  No abdominal aortic aneurysm.  A new left para-aortic 2.1 x 1.2 cm lymph node is seen on image 73.  Multiple small aorto caval lymph nodes in the retroperitoneal space of the abdomen are also new.  No intraperitoneal free fluid or bowel obstruction.  Imaging through the pelvis shows no free intraperitoneal fluid.  A fem-fem bypass graft is noted and opacifies throughout its length. Multiple new common iliac lymph nodes are noted bilaterally. Although these do not meet CT criteria for pathologic enlargement, there interval appearance is concerning.  Uterus is surgically absent.  There is no adnexal mass. Diverticular changes are seen in the left colon without  diverticulitis.  Terminal ileum is normal. The appendix is not visualized, but there is no edema or inflammation in the region of the cecum.  Bone windows reveal no worrisome lytic or sclerotic osseous lesions.  IMPRESSION: Interval development of multiple small low-density liver lesions concerning for metastatic involvement.  There is abnormal perfusion to the lateral segment the left liver with fairly marked attenuation of the left portal vein, suggesting metastatic involvement of this vascular structure.  The main  portal vein and right portal vein remain patent.  Interval development of retroperitoneal lymphadenopathy, consistent with metastatic disease.   Original Report Authenticated By: Kennith Center, M.D.      ASSESSMENT: This is a very pleasant 79 years old white female with metastatic non-small cell lung cancer, adenocarcinoma with positive EGFR mutation in exon 19. The patient failed first-line chemotherapy with carboplatin, paclitaxel and Avastin followed by maintenance Avastin.   PLAN: I have a lengthy discussion with the patient and her family today about her current condition with the finding of positive EGFR mutation.. I recommended for the patient treatment with Gilotrif (Afatinib) 40 mg by mouth daily. I discussed with the patient adverse effect of this treatment including but not limited to skin rash, diarrhea, interstitial lung disease, liver or renal dysfunction. The patient would like to proceed with treatment as planned. I will send her prescription to [facility] outpatient pharmacy. She is expected to start this treatment in the next few days if there is no issues with her insurance coverage. The patient would come back for followup visit in 2 weeks for evaluation and management any adverse effect of her treatment. She was advised to call immediately if she has any concerning symptoms in the interval.  All questions were answered. The patient knows to call the clinic with any  problems, questions or concerns. We can certainly see the patient much sooner if necessary.  I spent 15 minutes counseling the patient face to face. The total time spent in the appointment was 25 minutes.

## 2012-06-24 NOTE — Patient Instructions (Signed)
Your tumor cells has positive EGFR mutation. Would discuss treatment with Gilotrif

## 2012-06-26 ENCOUNTER — Telehealth: Payer: Self-pay | Admitting: Medical Oncology

## 2012-06-26 ENCOUNTER — Telehealth: Payer: Self-pay | Admitting: *Deleted

## 2012-06-26 NOTE — Telephone Encounter (Signed)
INFORMED TERRIN THAT PT. WILL BE RECEIVING GILOTRIF [AFATINIB]. A COPY OF DR.MOHAMED'S 06/09/13 DICTATION WAS FAXED TO DR.MCCUEN'S OFFICE.

## 2012-06-26 NOTE — Telephone Encounter (Signed)
Pt picked up her gilotrif today

## 2012-06-30 ENCOUNTER — Telehealth: Payer: Self-pay | Admitting: Medical Oncology

## 2012-06-30 DIAGNOSIS — C3490 Malignant neoplasm of unspecified part of unspecified bronchus or lung: Secondary | ICD-10-CM

## 2012-06-30 NOTE — Telephone Encounter (Signed)
I also told pt okay to schedule mammogram and to wait for lab results on Monday before scheduling dental cleaning.

## 2012-06-30 NOTE — Telephone Encounter (Signed)
Diarrhea started -not bad . She is using imodium - she asked if it is  ok to use imodium for diarrhea ( she said  she has liver involvement and wanted to know if ok to use imodium)  I told her ok to use imodium as directed.. She also wants her port flushed because it " sealed off "  at her last chemo. She wants approval to  get her mammogram and get her teeth cleaned. I told her okay for imodium and increase fluids. I scheduled labs and port flush for Monday and  I moved her f/u to 2 weeks from Gliotrif start date ( 06/27/12).

## 2012-07-03 ENCOUNTER — Other Ambulatory Visit (HOSPITAL_BASED_OUTPATIENT_CLINIC_OR_DEPARTMENT_OTHER): Payer: Medicare Other | Admitting: Lab

## 2012-07-03 ENCOUNTER — Ambulatory Visit: Payer: Medicare Other

## 2012-07-03 ENCOUNTER — Other Ambulatory Visit: Payer: Medicare Other | Admitting: Lab

## 2012-07-03 VITALS — BP 122/73 | HR 89 | Temp 97.0°F

## 2012-07-03 DIAGNOSIS — C3410 Malignant neoplasm of upper lobe, unspecified bronchus or lung: Secondary | ICD-10-CM

## 2012-07-03 DIAGNOSIS — C3490 Malignant neoplasm of unspecified part of unspecified bronchus or lung: Secondary | ICD-10-CM

## 2012-07-03 LAB — CBC WITH DIFFERENTIAL/PLATELET
Basophils Absolute: 0 10*3/uL (ref 0.0–0.1)
EOS%: 12.1 % — ABNORMAL HIGH (ref 0.0–7.0)
HCT: 42.9 % (ref 34.8–46.6)
HGB: 14.2 g/dL (ref 11.6–15.9)
LYMPH%: 8.7 % — ABNORMAL LOW (ref 14.0–49.7)
MCH: 29.6 pg (ref 25.1–34.0)
MCV: 89.4 fL (ref 79.5–101.0)
MONO%: 6.9 % (ref 0.0–14.0)
NEUT%: 71.7 % (ref 38.4–76.8)
Platelets: 161 10*3/uL (ref 145–400)

## 2012-07-03 LAB — COMPREHENSIVE METABOLIC PANEL (CC13)
AST: 33 U/L (ref 5–34)
Albumin: 3.4 g/dL — ABNORMAL LOW (ref 3.5–5.0)
Alkaline Phosphatase: 187 U/L — ABNORMAL HIGH (ref 40–150)
BUN: 16 mg/dL (ref 7.0–26.0)
Creatinine: 0.8 mg/dL (ref 0.6–1.1)
Potassium: 3.7 mEq/L (ref 3.5–5.1)
Total Bilirubin: 0.68 mg/dL (ref 0.20–1.20)

## 2012-07-03 MED ORDER — HEPARIN SOD (PORK) LOCK FLUSH 100 UNIT/ML IV SOLN
500.0000 [IU] | Freq: Once | INTRAVENOUS | Status: AC
  Administered 2012-07-03: 500 [IU] via INTRAVENOUS
  Filled 2012-07-03: qty 5

## 2012-07-03 MED ORDER — SODIUM CHLORIDE 0.9 % IJ SOLN
10.0000 mL | INTRAMUSCULAR | Status: DC | PRN
  Administered 2012-07-03: 10 mL via INTRAVENOUS
  Filled 2012-07-03: qty 10

## 2012-07-05 ENCOUNTER — Other Ambulatory Visit: Payer: Self-pay | Admitting: *Deleted

## 2012-07-05 ENCOUNTER — Telehealth: Payer: Self-pay | Admitting: Internal Medicine

## 2012-07-05 NOTE — Telephone Encounter (Signed)
Talked tp patient and gave her appt for 07/11/12 r/s from 1/16 per MD. Appt are lab and MD , flush cancelled per patient nurse notified. Last flush was 07/03/12

## 2012-07-06 ENCOUNTER — Other Ambulatory Visit: Payer: Medicare Other | Admitting: Lab

## 2012-07-06 ENCOUNTER — Ambulatory Visit: Payer: Medicare Other | Admitting: Internal Medicine

## 2012-07-10 ENCOUNTER — Other Ambulatory Visit: Payer: Self-pay | Admitting: *Deleted

## 2012-07-11 ENCOUNTER — Other Ambulatory Visit: Payer: Self-pay | Admitting: *Deleted

## 2012-07-11 ENCOUNTER — Other Ambulatory Visit (HOSPITAL_BASED_OUTPATIENT_CLINIC_OR_DEPARTMENT_OTHER): Payer: Medicare Other | Admitting: Lab

## 2012-07-11 ENCOUNTER — Encounter: Payer: Self-pay | Admitting: Internal Medicine

## 2012-07-11 ENCOUNTER — Telehealth: Payer: Self-pay | Admitting: Internal Medicine

## 2012-07-11 ENCOUNTER — Ambulatory Visit (HOSPITAL_BASED_OUTPATIENT_CLINIC_OR_DEPARTMENT_OTHER): Payer: Medicare Other | Admitting: Internal Medicine

## 2012-07-11 VITALS — BP 130/70 | HR 94 | Temp 97.8°F | Resp 18 | Ht 64.5 in | Wt 139.0 lb

## 2012-07-11 DIAGNOSIS — R21 Rash and other nonspecific skin eruption: Secondary | ICD-10-CM

## 2012-07-11 DIAGNOSIS — C3490 Malignant neoplasm of unspecified part of unspecified bronchus or lung: Secondary | ICD-10-CM

## 2012-07-11 DIAGNOSIS — R197 Diarrhea, unspecified: Secondary | ICD-10-CM

## 2012-07-11 DIAGNOSIS — C3410 Malignant neoplasm of upper lobe, unspecified bronchus or lung: Secondary | ICD-10-CM

## 2012-07-11 LAB — COMPREHENSIVE METABOLIC PANEL (CC13)
ALT: 23 U/L (ref 0–55)
AST: 22 U/L (ref 5–34)
Albumin: 3.2 g/dL — ABNORMAL LOW (ref 3.5–5.0)
Alkaline Phosphatase: 143 U/L (ref 40–150)
BUN: 16 mg/dL (ref 7.0–26.0)
CO2: 26 mEq/L (ref 22–29)
Calcium: 9 mg/dL (ref 8.4–10.4)
Chloride: 104 mEq/L (ref 98–107)
Creatinine: 1 mg/dL (ref 0.6–1.1)
Glucose: 127 mg/dl — ABNORMAL HIGH (ref 70–99)
Potassium: 3.4 mEq/L — ABNORMAL LOW (ref 3.5–5.1)
Sodium: 141 mEq/L (ref 136–145)
Total Bilirubin: 0.61 mg/dL (ref 0.20–1.20)
Total Protein: 6.5 g/dL (ref 6.4–8.3)

## 2012-07-11 LAB — CBC WITH DIFFERENTIAL/PLATELET
BASO%: 0.7 % (ref 0.0–2.0)
Eosinophils Absolute: 0.9 10*3/uL — ABNORMAL HIGH (ref 0.0–0.5)
HCT: 41.1 % (ref 34.8–46.6)
LYMPH%: 5.6 % — ABNORMAL LOW (ref 14.0–49.7)
MCHC: 33.5 g/dL (ref 31.5–36.0)
MCV: 90.5 fL (ref 79.5–101.0)
MONO%: 6.3 % (ref 0.0–14.0)
NEUT%: 75.7 % (ref 38.4–76.8)
Platelets: 158 10*3/uL (ref 145–400)
RBC: 4.54 10*6/uL (ref 3.70–5.45)

## 2012-07-11 MED ORDER — CLINDAMYCIN PHOSPHATE 1 % EX SOLN: Freq: Two times a day (BID) | CUTANEOUS | Status: DC

## 2012-07-11 MED ORDER — CHLORHEXIDINE GLUCONATE 0.12 % MT SOLN: 15.0000 mL | Freq: Two times a day (BID) | OROMUCOSAL | Status: DC

## 2012-07-11 MED ORDER — DOXYCYCLINE HYCLATE 100 MG PO TABS: 100.0000 mg | ORAL_TABLET | Freq: Two times a day (BID) | ORAL | Status: DC

## 2012-07-11 MED ORDER — PROCHLORPERAZINE MALEATE 10 MG PO TABS: 10.0000 mg | ORAL_TABLET | Freq: Four times a day (QID) | ORAL | Status: DC | PRN

## 2012-07-11 MED ORDER — PROCHLORPERAZINE MALEATE 10 MG PO TABS: 10.0000 mg | ORAL_TABLET | Freq: Four times a day (QID) | ORAL | Status: AC | PRN

## 2012-07-11 NOTE — Progress Notes (Signed)
[facility] Telephone:([phone]   Fax:(336) [phone]  OFFICE PROGRESS NOTE  [name], MD [address] [facility], [place]. [place] [address]  DIAGNOSIS: Recurrent non-small cell lung cancer, adenocarcinoma with positive EGFR mutation in exon 19 and negative ALK gene translocation initially diagnosed as stage IIIA in (T1b N2 M0) in July 2012.   PRIOR THERAPY:  #1 Status post concurrent chemoradiation with weekly carboplatin and paclitaxel, last dose was given 03/01/2011.  #2 status post 3 seconds of consolidation chemotherapy was carboplatin for AUC of 5 and Alimta 500 mg/M2 giving him to see weeks, last dose was given 05/27/2011.  # 3 systemic chemotherapy with carboplatin for AUC of 5, paclitaxel 175 mg/M2 and Avastin 15 mg/kg every 3 weeks. Status post 6 cycles.  #4 maintenance therapy with the Avastin 15 mg/kg every 3 weeks, status post 6 cycles, last dose was given on 06/02/2012 discontinued today secondary to disease progression.   CURRENT THERAPY:Gilotrif (Afatinib) 40 mg by mouth daily started 06/27/2012.  INTERVAL HISTORY: [name] 79 y.o. female returns to the clinic today for followup visit accompanied by her husband. The patient is feeling fine and tolerating her treatment with Gilotrif (Afatinib) fairly well with no significant adverse effects except for grade 2 skin rash as well as few episodes of diarrhea. She is using Imodium with improvement in her diarrhea. She denied having any significant chest pain, shortness breath, cough or hemoptysis. The patient denied having any significant weight loss.  MEDICAL HISTORY: Past Medical History  Diagnosis Date  . Lung mass   . History of radiation therapy 7/31/212-03/08/2011    right upper lobe lung adenocarcinoma  . COPD (chronic obstructive pulmonary disease)   . Hypertension   . Hypercholesterolemia   . Osteoporosis   . History of chemotherapy     carboplatin/paclitaxel    . Cancer July 2012    , right upper lobe    ALLERGIES:  is allergic to aleve and zyban.  MEDICATIONS:  Current Outpatient Prescriptions  Medication Sig Dispense Refill  . afatinib dimaleate (GILOTRIF) 40 MG tablet Take 1 tablet (40 mg total) by mouth daily. Take on an empty stomach 1hr before or 2 hrs after meals.  30 tablet  2  . cetirizine (ZYRTEC) 5 MG tablet Take 5 mg by mouth daily.      [name] lidocaine-prilocaine (EMLA) cream Apply topically as needed.  30 g  1  . lidocaine-prilocaine (EMLA) cream Apply topically as needed. Use as directed  30 g  1  . losartan-hydrochlorothiazide (HYZAAR) 50-12.5 MG per tablet Take 1 tablet by mouth daily. Takes 1/2 tablet by mouth daily      . prochlorperazine (COMPAZINE) 10 MG tablet Take 1 tablet (10 mg total) by mouth every 6 (six) hours as needed.  30 tablet  1  . Thiamine HCl (VITAMIN B-1 PO) Take by mouth daily.      . traMADol (ULTRAM) 50 MG tablet Take 1 tablet (50 mg total) by mouth every 6 (six) hours as needed. Maximum dose= 8 tablets per day  60 tablet  0    SURGICAL HISTORY:  Past Surgical History  Procedure Date  . Abdominal hysterectomy     s/p for fibroid tumor  . Portacath placement     right ij     REVIEW OF SYSTEMS:  A comprehensive review of systems was negative except for: Constitutional: positive for fatigue Skin rash on the face   PHYSICAL EXAMINATION: General appearance:  alert, cooperative and no distress Head: Normocephalic, without obvious abnormality, atraumatic Neck: no adenopathy Lymph nodes: Cervical, supraclavicular, and axillary nodes normal. Resp: clear to auscultation bilaterally Cardio: regular rate and rhythm, S1, S2 normal, no murmur, click, rub or gallop GI: soft, non-tender; bowel sounds normal; no masses,  no organomegaly Extremities: extremities normal, atraumatic, no cyanosis or edema Neurologic: Alert and oriented X 3, normal strength and tone. Normal symmetric reflexes. Normal coordination and  gait Skin examination: Showed grade 2 maculopapular skin rash on the face.  ECOG PERFORMANCE STATUS: 1 - Symptomatic but completely ambulatory  Blood pressure 130/70, pulse 94, temperature 97.8 F (36.6 C), temperature source Oral, resp. rate 18, height 5' 4.5" (1.638 m), weight 139 lb (63.05 kg).  LABORATORY DATA: Lab Results  Component Value Date   WBC 7.4 07/11/2012   HGB 13.8 07/11/2012   HCT 41.1 07/11/2012   MCV 90.5 07/11/2012   PLT 158 07/11/2012      Chemistry      Component Value Date/Time   NA 139 07/03/2012 0938   NA 136 01/25/2012 1137   NA 142 09/16/2011 0917   K 3.7 07/03/2012 0938   K 4.1 01/25/2012 1137   K 3.7 09/16/2011 0917   CL 107 07/03/2012 0938   CL 109 01/25/2012 1137   CL 106 09/16/2011 0917   CO2 24 07/03/2012 0938   CO2 23 01/25/2012 1137   CO2 29 09/16/2011 0917   BUN 16.0 07/03/2012 0938   BUN 12 01/25/2012 1137   BUN 16 09/16/2011 0917   CREATININE 0.8 07/03/2012 0938   CREATININE 0.62 01/25/2012 1137   CREATININE 0.8 09/16/2011 0917      Component Value Date/Time   CALCIUM 9.2 07/03/2012 0938   CALCIUM 9.3 01/25/2012 1137   CALCIUM 8.3 09/16/2011 0917   ALKPHOS 187* 07/03/2012 0938   ALKPHOS 51 01/25/2012 1137   ALKPHOS 62 09/16/2011 0917   AST 33 07/03/2012 0938   AST 13 01/25/2012 1137   AST 18 09/16/2011 0917   ALT 30 07/03/2012 0938   ALT 14 01/25/2012 1137   BILITOT 0.68 07/03/2012 0938   BILITOT 0.5 01/25/2012 1137   BILITOT 0.60 09/16/2011 0917       ASSESSMENT: This is a very pleasant 79 years old white female with metastatic non-small cell lung cancer, adenocarcinoma and positive EGFR mutation in exon 19, currently on therapy with Gilotrif started 2 weeks ago. The patient is tolerating her treatment fairly well except for skin rash and diarrhea which are the expected major adverse effect of this treatment.  PLAN: I recommended for the patient to continue her treatment with Gilotrif as scheduled. For the skin rash, I started the patient on doxycycline 100 mg by  mouth twice a day in addition to clindamycin lotion 1% to be applied to the skin rash area twice a day. For the diarrhea, the patient will continue on treatment with Imodium on as-needed basis and I may consider her for treatment with Lomotil if diarrhea is not controlled. The patient would come back for followup visit in 2 weeks. She was advised to call immediately if she has any concerning symptoms in the interval.  All questions were answered. The patient knows to call the clinic with any problems, questions or concerns. We can certainly see the patient much sooner if necessary.  I spent 15 minutes counseling the patient face to face. The total time spent in the appointment was 25 minutes.

## 2012-07-11 NOTE — Telephone Encounter (Signed)
gv and printed appt schedule for Feb...the patient aware °

## 2012-07-11 NOTE — Patient Instructions (Signed)
Continue treatment with Gilotrif. You were given prescription today for doxycycline as well as clindamycin lotion for the skin rash. Followup in 2 weeks

## 2012-07-17 ENCOUNTER — Telehealth: Payer: Self-pay | Admitting: Medical Oncology

## 2012-07-17 NOTE — Telephone Encounter (Signed)
Per Dr Arbutus Ped the stomatitis is a side effect of gilotrif and if it worsens she needs to call back and he will reduce next refill . Otherwise she needs to continue Gilotrif. Pt understands.

## 2012-07-17 NOTE — Telephone Encounter (Signed)
Over the weekend had " diarrhea badly, 6-8 times a day and waking her up at  night. " .  Her PCP prescribed Lomotil and flagyl and she stopped an antibiotic. Her diarrhea has improved. Should she hold chemo ?

## 2012-07-19 ENCOUNTER — Telehealth: Payer: Self-pay | Admitting: Medical Oncology

## 2012-07-19 DIAGNOSIS — R197 Diarrhea, unspecified: Secondary | ICD-10-CM

## 2012-07-19 MED ORDER — LOPERAMIDE HCL 2 MG PO TABS: 2.0000 mg | ORAL_TABLET | Freq: Four times a day (QID) | ORAL | Status: DC | PRN

## 2012-07-19 NOTE — Telephone Encounter (Signed)
Per Dr Arbutus Ped I instructed pt to start Imodium ( she has it but was not taking it) in addition to lomotil. She will try to stay on Gilotrif  40 mg tablet (  Has 9 left) . I told her Dr Arbutus Ped is going to reduce the dose and said she can try to finish 40 mg tablets or pick up lower dose at [facility] outpt. [name] said she will see if she can continue 40 mg dose with addition of imodium. She stated her mouth/lips were a little better. I told her to call me back if she feels like she needs IVF. She voices understanding.

## 2012-07-20 ENCOUNTER — Other Ambulatory Visit: Payer: Self-pay | Admitting: Medical Oncology

## 2012-07-20 ENCOUNTER — Telehealth: Payer: Self-pay | Admitting: Medical Oncology

## 2012-07-20 ENCOUNTER — Ambulatory Visit (HOSPITAL_BASED_OUTPATIENT_CLINIC_OR_DEPARTMENT_OTHER): Payer: Medicare Other

## 2012-07-20 VITALS — BP 113/61 | HR 91 | Temp 98.1°F | Resp 17

## 2012-07-20 DIAGNOSIS — Z95828 Presence of other vascular implants and grafts: Secondary | ICD-10-CM

## 2012-07-20 DIAGNOSIS — E860 Dehydration: Secondary | ICD-10-CM

## 2012-07-20 DIAGNOSIS — R197 Diarrhea, unspecified: Secondary | ICD-10-CM

## 2012-07-20 DIAGNOSIS — C3490 Malignant neoplasm of unspecified part of unspecified bronchus or lung: Secondary | ICD-10-CM

## 2012-07-20 MED ORDER — SODIUM CHLORIDE 0.9 % IJ SOLN
10.0000 mL | INTRAMUSCULAR | Status: DC | PRN
  Administered 2012-07-20: 10 mL via INTRAVENOUS
  Filled 2012-07-20: qty 10

## 2012-07-20 MED ORDER — HEPARIN SOD (PORK) LOCK FLUSH 100 UNIT/ML IV SOLN
500.0000 [IU] | Freq: Once | INTRAVENOUS | Status: AC
  Administered 2012-07-20: 500 [IU] via INTRAVENOUS
  Filled 2012-07-20: qty 5

## 2012-07-20 MED ORDER — SODIUM CHLORIDE 0.9 % IV SOLN
Freq: Once | INTRAVENOUS | Status: AC
Start: 2012-07-20 — End: 2012-07-20
  Administered 2012-07-20: 13:00:00 via INTRAVENOUS

## 2012-07-20 NOTE — Telephone Encounter (Signed)
appt made for IVF-pt notified

## 2012-07-20 NOTE — Patient Instructions (Signed)
Dehydration, Adult Dehydration is when you lose more fluids from the body than you take in. Vital organs like the kidneys, brain, and heart cannot function without a proper amount of fluids and salt. Any loss of fluids from the body can cause dehydration.  CAUSES   Vomiting.  Diarrhea.  Excessive sweating.  Excessive urine output.  Fever. SYMPTOMS  Mild dehydration  Thirst.  Dry lips.  Slightly dry mouth. Moderate dehydration  Very dry mouth.  Sunken eyes.  Skin does not bounce back quickly when lightly pinched and released.  Dark urine and decreased urine production.  Decreased tear production.  Headache. Severe dehydration  Very dry mouth.  Extreme thirst.  Rapid, weak pulse (more than 100 beats per minute at rest).  Cold hands and feet.  Not able to sweat in spite of heat and temperature.  Rapid breathing.  Blue lips.  Confusion and lethargy.  Difficulty being awakened.  Minimal urine production.  No tears. DIAGNOSIS  Your caregiver will diagnose dehydration based on your symptoms and your exam. Blood and urine tests will help confirm the diagnosis. The diagnostic evaluation should also identify the cause of dehydration. TREATMENT  Treatment of mild or moderate dehydration can often be done at home by increasing the amount of fluids that you drink. It is best to drink small amounts of fluid more often. Drinking too much at one time can make vomiting worse. Refer to the home care instructions below. Severe dehydration needs to be treated at the hospital where you will probably be given intravenous (IV) fluids that contain water and electrolytes. HOME CARE INSTRUCTIONS   Ask your caregiver about specific rehydration instructions.  Drink enough fluids to keep your urine clear or pale yellow.  Drink small amounts frequently if you have nausea and vomiting.  Eat as you normally do.  Avoid:  Foods or drinks high in sugar.  Carbonated  drinks.  Juice.  Extremely hot or cold fluids.  Drinks with caffeine.  Fatty, greasy foods.  Alcohol.  Tobacco.  Overeating.  Gelatin desserts.  Wash your hands well to avoid spreading bacteria and viruses.  Only take over-the-counter or prescription medicines for pain, discomfort, or fever as directed by your caregiver.  Ask your caregiver if you should continue all prescribed and over-the-counter medicines.  Keep all follow-up appointments with your caregiver. SEEK MEDICAL CARE IF:  You have abdominal pain and it increases or stays in one area (localizes).  You have a rash, stiff neck, or severe headache.  You are irritable, sleepy, or difficult to awaken.  You are weak, dizzy, or extremely thirsty. SEEK IMMEDIATE MEDICAL CARE IF:   You are unable to keep fluids down or you get worse despite treatment.  You have frequent episodes of vomiting or diarrhea.  You have blood or green matter (bile) in your vomit.  You have blood in your stool or your stool looks black and tarry.  You have not urinated in 6 to 8 hours, or you have only urinated a small amount of very dark urine.  You have a fever.  You faint. MAKE SURE YOU:   Understand these instructions.  Will watch your condition.  Will get help right away if you are not doing well or get worse. Document Released: 06/07/2005 Document Revised: 08/30/2011 Document Reviewed: 01/25/2011 [employer] Patient Information 2013 [employer].  

## 2012-07-20 NOTE — Telephone Encounter (Signed)
I called pt this afternoon and she feels much better after IVF. Per Dr Arbutus Ped I told her to stop flagyl  ,restart doxycyclilne and to hold Gilotrif until diarrhea stools are down to 1-2 times /day. I also told her to call me tomorrow with update.

## 2012-07-21 ENCOUNTER — Telehealth: Payer: Self-pay | Admitting: Medical Oncology

## 2012-07-21 NOTE — Telephone Encounter (Signed)
No Bm last night . Two episodes of diarrheal stools this am. I toldl pt to call back in an hour and if she had another stool she may need IVF

## 2012-07-21 NOTE — Telephone Encounter (Signed)
She has not had any more stools . I told her to increase fluids and to call for any concerns

## 2012-07-25 ENCOUNTER — Ambulatory Visit (HOSPITAL_BASED_OUTPATIENT_CLINIC_OR_DEPARTMENT_OTHER): Payer: Medicare Other | Admitting: Physician Assistant

## 2012-07-25 ENCOUNTER — Other Ambulatory Visit (HOSPITAL_BASED_OUTPATIENT_CLINIC_OR_DEPARTMENT_OTHER): Payer: Medicare Other | Admitting: Lab

## 2012-07-25 ENCOUNTER — Encounter: Payer: Self-pay | Admitting: Physician Assistant

## 2012-07-25 ENCOUNTER — Telehealth: Payer: Self-pay | Admitting: Internal Medicine

## 2012-07-25 VITALS — BP 109/63 | HR 95 | Temp 97.2°F | Resp 18 | Ht 64.5 in | Wt 137.3 lb

## 2012-07-25 DIAGNOSIS — C3410 Malignant neoplasm of upper lobe, unspecified bronchus or lung: Secondary | ICD-10-CM

## 2012-07-25 DIAGNOSIS — C3490 Malignant neoplasm of unspecified part of unspecified bronchus or lung: Secondary | ICD-10-CM

## 2012-07-25 LAB — CBC WITH DIFFERENTIAL/PLATELET
Basophils Absolute: 0 10*3/uL (ref 0.0–0.1)
Eosinophils Absolute: 0.5 10*3/uL (ref 0.0–0.5)
HCT: 40.8 % (ref 34.8–46.6)
LYMPH%: 7.4 % — ABNORMAL LOW (ref 14.0–49.7)
MCV: 88.5 fL (ref 79.5–101.0)
MONO#: 0.4 10*3/uL (ref 0.1–0.9)
MONO%: 5.9 % (ref 0.0–14.0)
NEUT#: 5.1 10*3/uL (ref 1.5–6.5)
NEUT%: 78.9 % — ABNORMAL HIGH (ref 38.4–76.8)
Platelets: 176 10*3/uL (ref 145–400)
RBC: 4.6 10*6/uL (ref 3.70–5.45)
WBC: 6.5 10*3/uL (ref 3.9–10.3)

## 2012-07-25 LAB — COMPREHENSIVE METABOLIC PANEL (CC13)
Alkaline Phosphatase: 111 U/L (ref 40–150)
BUN: 15 mg/dL (ref 7.0–26.0)
CO2: 23 mEq/L (ref 22–29)
Creatinine: 0.9 mg/dL (ref 0.6–1.1)
Glucose: 149 mg/dl — ABNORMAL HIGH (ref 70–99)
Sodium: 141 mEq/L (ref 136–145)
Total Bilirubin: 0.61 mg/dL (ref 0.20–1.20)
Total Protein: 5.9 g/dL — ABNORMAL LOW (ref 6.4–8.3)

## 2012-07-25 NOTE — Telephone Encounter (Signed)
Gave pt appt for flush, lab ,ML for February, March and April 2014

## 2012-07-25 NOTE — Patient Instructions (Addendum)
Start afatinib 30 mg by mouth daily Follow up in 2 weeks Your port a cath will be due to be flushed in early March 2014

## 2012-07-25 NOTE — Progress Notes (Signed)
Quick Note:  Call patient with the result and Rx K Dur 20 meq po qd X 7 ______

## 2012-07-26 ENCOUNTER — Telehealth: Payer: Self-pay | Admitting: Pharmacist

## 2012-07-26 ENCOUNTER — Telehealth: Payer: Self-pay | Admitting: *Deleted

## 2012-07-26 DIAGNOSIS — E876 Hypokalemia: Secondary | ICD-10-CM

## 2012-07-26 MED ORDER — POTASSIUM CHLORIDE CRYS ER 20 MEQ PO TBCR: 20.0000 meq | EXTENDED_RELEASE_TABLET | Freq: Every day | ORAL | Status: DC

## 2012-07-26 NOTE — Telephone Encounter (Signed)
Message copied by Caren Griffins on Wed Jul 26, 2012 11:01 AM ------      Message from: Si Gaul      Created: Tue Jul 25, 2012  6:00 PM       Call patient with the result and Rx K Dur 20 meq po qd X 7

## 2012-07-26 NOTE — Telephone Encounter (Signed)
Pt verbalized understanding.  SLJ 

## 2012-07-26 NOTE — Telephone Encounter (Signed)
reached [name] today. She is  a very pleasant lady who just started back up on her Gilotrif after some episodes of diarrhea. She originally started on 40 mg early january and now has just restarted at the reduced dose of 30 mg yesterday (2/4). She has no complaints at this time as the diarrhea has resolved. Will f/u in 1 week to assess lower dose effects and adherence.   Christell Faith, PharmD

## 2012-07-28 ENCOUNTER — Telehealth: Payer: Self-pay | Admitting: *Deleted

## 2012-07-28 NOTE — Progress Notes (Signed)
[facility] [phone]   [phone]  OFFICE PROGRESS NOTE  [name], MD [address] [facility], [place]. [place] [address]  DIAGNOSIS: Recurrent non-small cell lung cancer, adenocarcinoma with positive EGFR mutation in exon 19 and negative ALK gene translocation initially diagnosed as stage IIIA in (T1b N2 M0) in July 2012.   PRIOR THERAPY:  #1 Status post concurrent chemoradiation with weekly carboplatin and paclitaxel, last dose was given 03/01/2011.  #2 status post 3 seconds of consolidation chemotherapy was carboplatin for AUC of 5 and Alimta 500 mg/M2 giving him to see weeks, last dose was given 05/27/2011.  # 3 systemic chemotherapy with carboplatin for AUC of 5, paclitaxel 175 mg/M2 and Avastin 15 mg/kg every 3 weeks. Status post 6 cycles.  #4 maintenance therapy with the Avastin 15 mg/kg every 3 weeks, status post 6 cycles, last dose was given on 06/02/2012 discontinued today secondary to disease progression.   CURRENT THERAPY:Gilotrif (Afatinib) 40 mg by mouth daily started 06/27/2012. Status post approximately 1 month of therapy.  INTERVAL HISTORY: [name] 79 y.o. female returns to the clinic today for followup visit accompanied by her husband. She was lacking 8 days of completing a month on Gliotrif 40 mg by mouth daily. Therapy was held due to significant diarrhea. She repots that she has had normal bowel movements for the past 3 days with no further diarrhea. She was prescribed Lomotil by Dr. Waynard Edwards. She was taking the Lomotil and Imodium at the same time.. She denied having any significant chest pain, shortness breath, cough or hemoptysis. The patient denied having any significant weight loss.  MEDICAL HISTORY: Past Medical History  Diagnosis Date  . Lung mass   . History of radiation therapy 7/31/212-03/08/2011    right upper lobe lung adenocarcinoma  . COPD (chronic obstructive pulmonary disease)    . Hypertension   . Hypercholesterolemia   . Osteoporosis   . History of chemotherapy     carboplatin/paclitaxel  . Cancer July 2012    , right upper lobe    ALLERGIES:  is allergic to aleve and zyban.  MEDICATIONS:  Current Outpatient Prescriptions  Medication Sig Dispense Refill  . afatinib dimaleate (GILOTRIF) 30 MG tablet Take 30 mg by mouth daily. Take on an empty stomach 1hr before or 2hrs after meals.      . cetirizine (ZYRTEC) 5 MG tablet Take 5 mg by mouth daily.      . chlorhexidine (PERIDEX) 0.12 % solution Use as directed 15 mLs in the mouth or throat 2 (two) times daily.  240 mL  2  . clindamycin (CLEOCIN-T) 1 % external solution Apply topically 2 (two) times daily.  60 mL  2  . doxycycline (VIBRA-TABS) 100 MG tablet Take 1 tablet (100 mg total) by mouth 2 (two) times daily.  60 tablet  0  . lidocaine-prilocaine (EMLA) cream Apply topically as needed.  30 g  1  . lidocaine-prilocaine (EMLA) cream Apply topically as needed. Use as directed  30 g  1  . loperamide (IMODIUM A-D) 2 MG tablet Take 1 tablet (2 mg total) by mouth 4 (four) times daily as needed for diarrhea or loose stools.  30 tablet  0  . losartan-hydrochlorothiazide (HYZAAR) 50-12.5 MG per tablet Take 1 tablet by mouth daily. Takes 1/2 tablet by mouth daily      . potassium chloride SA (K-DUR,KLOR-CON) 20 MEQ tablet Take 1 tablet (20 mEq total) by mouth daily. X  7 days  7 tablet  0  . prochlorperazine (COMPAZINE) 10 MG tablet Take 1 tablet (10 mg total) by mouth every 6 (six) hours as needed.  30 tablet  1  . Thiamine HCl (VITAMIN B-1 PO) Take by mouth daily.      . traMADol (ULTRAM) 50 MG tablet Take 1 tablet (50 mg total) by mouth every 6 (six) hours as needed. Maximum dose= 8 tablets per day  60 tablet  0    SURGICAL HISTORY:  Past Surgical History  Procedure Date  . Abdominal hysterectomy     s/p for fibroid tumor  . Portacath placement     right ij     REVIEW OF SYSTEMS:  A comprehensive review of  systems was negative except for: Constitutional: positive for fatigue Skin rash on the face   PHYSICAL EXAMINATION: General appearance: alert, cooperative and no distress Head: Normocephalic, without obvious abnormality, atraumatic Neck: no adenopathy Lymph nodes: Cervical, supraclavicular, and axillary nodes normal. Resp: clear to auscultation bilaterally Cardio: regular rate and rhythm, S1, S2 normal, no murmur, click, rub or gallop GI: soft, non-tender; bowel sounds normal; no masses,  no organomegaly Extremities: extremities normal, atraumatic, no cyanosis or edema Neurologic: Alert and oriented X 3, normal strength and tone. Normal symmetric reflexes. Normal coordination and gait Skin examination: Showed grade 1 maculopapular skin rash on the face.  ECOG PERFORMANCE STATUS: 1 - Symptomatic but completely ambulatory  Blood pressure 109/63, pulse 95, temperature 97.2 F (36.2 C), temperature source Oral, resp. rate 18, height 5' 4.5" (1.638 m), weight 137 lb 4.8 oz (62.279 kg).  LABORATORY DATA: Lab Results  Component Value Date   WBC 6.5 07/25/2012   HGB 13.9 07/25/2012   HCT 40.8 07/25/2012   MCV 88.5 07/25/2012   PLT 176 07/25/2012      Chemistry      Component Value Date/Time   NA 141 07/25/2012 1150   NA 136 01/25/2012 1137   NA 142 09/16/2011 0917   K 3.1 Repeated and Verified* 07/25/2012 1150   K 4.1 01/25/2012 1137   K 3.7 09/16/2011 0917   CL 108* 07/25/2012 1150   CL 109 01/25/2012 1137   CL 106 09/16/2011 0917   CO2 23 07/25/2012 1150   CO2 23 01/25/2012 1137   CO2 29 09/16/2011 0917   BUN 15.0 07/25/2012 1150   BUN 12 01/25/2012 1137   BUN 16 09/16/2011 0917   CREATININE 0.9 07/25/2012 1150   CREATININE 0.62 01/25/2012 1137   CREATININE 0.8 09/16/2011 0917      Component Value Date/Time   CALCIUM 9.2 07/25/2012 1150   CALCIUM 9.3 01/25/2012 1137   CALCIUM 8.3 09/16/2011 0917   ALKPHOS 111 07/25/2012 1150   ALKPHOS 51 01/25/2012 1137   ALKPHOS 62 09/16/2011 0917   AST 24 07/25/2012 1150   AST 13  01/25/2012 1137   AST 18 09/16/2011 0917   ALT 17 07/25/2012 1150   ALT 14 01/25/2012 1137   BILITOT 0.61 07/25/2012 1150   BILITOT 0.5 01/25/2012 1137   BILITOT 0.60 09/16/2011 2130       ASSESSMENT/PLAN: This is a very pleasant 79 years old white female with metastatic non-small cell lung cancer, adenocarcinoma and positive EGFR mutation in exon 19, currently on therapy with Gilotrif started 3 weeks ago. The patient was discussed with Dr. Arbutus Ped. She will discontinue the Gliotrif 40 mg dose. She will begin treatment with Gliotrif at 30 mg by mouth daily. Should she develop diarrhea she may alternate  Imodium and Lomotil as needed. The patient voiced understanding. She will follow up in 2 weeks for a symptom management visit.   [name] E, PA-C   She was advised to call immediately if she has any concerning symptoms in the interval.  All questions were answered. The patient knows to call the clinic with any problems, questions or concerns. We can certainly see the patient much sooner if necessary.  I spent 20 minutes counseling the patient face to face. The total time spent in the appointment was 30 minutes.

## 2012-07-28 NOTE — Telephone Encounter (Signed)
Pt called stating that this afternoon she started experiencing urinary frequency, urgency, and burning.  She feels like she has a UTI and wants to know what to do.  Dr Donnald Garre and Forde Radon out of office today.  Consulted with Dr Cleophas Dunker, she recommended that pt go to urgent care to be evaluated.  Pt verbalized understanding. SLJ

## 2012-08-04 ENCOUNTER — Other Ambulatory Visit: Payer: Self-pay | Admitting: *Deleted

## 2012-08-04 ENCOUNTER — Telehealth: Payer: Self-pay | Admitting: *Deleted

## 2012-08-04 ENCOUNTER — Encounter: Payer: Self-pay | Admitting: *Deleted

## 2012-08-04 NOTE — Progress Notes (Signed)
Open in error

## 2012-08-04 NOTE — Telephone Encounter (Signed)
Patient calling in to report that she has started having 3-4 loose stools a day for past couple of days. She has called her primary MD, and Dr Waynard Edwards gave her Rx for Lomotil. She states she has been taking about 6 a day. She is still eating a drinking plenty. Does not feel she is really dehydrated at this point. Note that patient also was started on Cipro on 07/28/12 for UTI, and this could be contributing to the diarrhea as well. Patient only has 2 more doses to be done with this. Spoke to Dr Arbutus Ped in [place]), patient is to continue all meds, but be more diligent with taking the Lomotil. She is to call us if the diarrhea continues after maximizing the Lomotil and Imodium.

## 2012-08-07 ENCOUNTER — Telehealth: Payer: Self-pay | Admitting: Pharmacist

## 2012-08-07 NOTE — Telephone Encounter (Signed)
Called [name] this afternoon to follow up on her Gilotrif and to discuss the diarrhea side effects she has been having. She states that she is still having ~4-6 bowel movements a day and is having a difficult time staying hydrated. I discussed the importance of drinking plenty of fluids to account for the loses due to her diarrhea and patient stated understanding and will make this a priority. I also discussed the lomotil and imodium that she has prescribed. [name] stated that she is still only taking the lomotil and I instructed her that she can/should be using both if she is not getting any relief with her max dose of lomotil. She stated understanding and said she will try alternating with imodium and lomotil and see if this improves her symptoms. Patient to see Tiana Loft on Thursday 2/20.  Christell Faith, PharmD

## 2012-08-10 ENCOUNTER — Other Ambulatory Visit: Payer: Medicare Other | Admitting: Lab

## 2012-08-10 ENCOUNTER — Telehealth: Payer: Self-pay | Admitting: Internal Medicine

## 2012-08-10 ENCOUNTER — Other Ambulatory Visit: Payer: Self-pay | Admitting: Medical Oncology

## 2012-08-10 ENCOUNTER — Ambulatory Visit (HOSPITAL_BASED_OUTPATIENT_CLINIC_OR_DEPARTMENT_OTHER): Payer: Medicare Other | Admitting: Physician Assistant

## 2012-08-10 VITALS — BP 93/58 | HR 94 | Temp 97.0°F | Resp 18 | Ht 64.5 in | Wt 131.0 lb

## 2012-08-10 LAB — CBC WITH DIFFERENTIAL/PLATELET
Basophils Absolute: 0 10*3/uL (ref 0.0–0.1)
Eosinophils Absolute: 0.3 10*3/uL (ref 0.0–0.5)
HCT: 43.3 % (ref 34.8–46.6)
HGB: 14.4 g/dL (ref 11.6–15.9)
LYMPH%: 4.9 % — ABNORMAL LOW (ref 14.0–49.7)
MCV: 88.1 fL (ref 79.5–101.0)
MONO#: 0.8 10*3/uL (ref 0.1–0.9)
MONO%: 9.4 % (ref 0.0–14.0)
NEUT#: 7.2 10*3/uL — ABNORMAL HIGH (ref 1.5–6.5)
Platelets: 177 10*3/uL (ref 145–400)
RBC: 4.92 10*6/uL (ref 3.70–5.45)
WBC: 8.8 10*3/uL (ref 3.9–10.3)

## 2012-08-10 LAB — COMPREHENSIVE METABOLIC PANEL (CC13)
Albumin: 3.5 g/dL (ref 3.5–5.0)
Alkaline Phosphatase: 102 U/L (ref 40–150)
BUN: 39.6 mg/dL — ABNORMAL HIGH (ref 7.0–26.0)
CO2: 25 mEq/L (ref 22–29)
Calcium: 9.7 mg/dL (ref 8.4–10.4)
Chloride: 101 mEq/L (ref 98–107)
Glucose: 106 mg/dl — ABNORMAL HIGH (ref 70–99)
Potassium: 3.3 mEq/L — ABNORMAL LOW (ref 3.5–5.1)

## 2012-08-10 LAB — URINALYSIS, MICROSCOPIC - CHCC
Bilirubin (Urine): NEGATIVE
Glucose: NEGATIVE mg/dL
Ketones: NEGATIVE mg/dL

## 2012-08-10 LAB — UA PROTEIN, DIPSTICK - CHCC: Protein, ur: 30 mg/dL

## 2012-08-10 MED ORDER — POTASSIUM CHLORIDE CRYS ER 20 MEQ PO TBCR: 20.0000 meq | EXTENDED_RELEASE_TABLET | Freq: Every day | ORAL | Status: DC

## 2012-08-10 MED ORDER — DIPHENOXYLATE-ATROPINE 2.5-0.025 MG PO TABS: 2.0000 | ORAL_TABLET | Freq: Four times a day (QID) | ORAL | Status: DC | PRN

## 2012-08-10 MED ORDER — PHENAZOPYRIDINE HCL 100 MG PO TABS: ORAL_TABLET | ORAL | Status: DC

## 2012-08-10 NOTE — Patient Instructions (Addendum)
Hold your Gliotrif for the next 4 to 5 days Avoid dairy in your diet until your diarrhea resolves Follow a "BRAT" diet Push fluids including cranberry juice. Take Pyridium as directed for pain with urination Take potassium as prescibed Follow up with Dr. Arbutus Ped in 3 weeks with a restaging CT scan of your chest, abdomen and pelvis to re-evaluate your disease

## 2012-08-10 NOTE — Telephone Encounter (Signed)
gv and printed appt schedule for march...pt wants to drink water based and is aware that cs will contact with time of ct

## 2012-08-13 NOTE — Progress Notes (Signed)
[facility] [phone]   Fax:(336) [phone]  OFFICE PROGRESS NOTE  [name], MD [address] [facility], [place]. [place] [address]  DIAGNOSIS: Recurrent non-small cell lung cancer, adenocarcinoma with positive EGFR mutation in exon 19 and negative ALK gene translocation initially diagnosed as stage IIIA in (T1b N2 M0) in July 2012.   PRIOR THERAPY:  #1 Status post concurrent chemoradiation with weekly carboplatin and paclitaxel, last dose was given 03/01/2011.  #2 status post 3 seconds of consolidation chemotherapy was carboplatin for AUC of 5 and Alimta 500 mg/M2 giving him to see weeks, last dose was given 05/27/2011.  # 3 systemic chemotherapy with carboplatin for AUC of 5, paclitaxel 175 mg/M2 and Avastin 15 mg/kg every 3 weeks. Status post 6 cycles.  #4 maintenance therapy with the Avastin 15 mg/kg every 3 weeks, status post 6 cycles, last dose was given on 06/02/2012 discontinued today secondary to disease progression.  #5 Gilotrif (Afatinib) 40 mg by mouth daily started 06/27/2012. Status post approximately 1 month of therapy.  CURRENT THERAPY:Gilotrif (Afatinib) 30 mg by mouth daily status post approximately 2 weeks of therapy  INTERVAL HISTORY: [name] 79 y.o. female returns to the clinic today for followup visit accompanied by her granddaughter, Marchelle Folks. She reports that she is still having 6-7 episodes of diarrhea daily. She had a urinary tract infection February 7 and was treated with 7 days of antibiotics that she completed 08/04/2012. Antibiotic was ciprofloxacin. She discontinued the doxycycline she was instructed. She was [facility]. She reports 2 days ago she developed burning with urination as well as some pain with urination. Regarding her diarrhea she states that she ran out of Lomotil yesterday. She was on potassium supplementation for hypokalemia  reports that she only took 5-7 days due to difficulty swallowing as well as some nausea and vomiting. She notes some hoarseness in her voice for the past 7-10 days has had a runny nose and some postnasal drip. She denied any fever or chills. She denied having any significant chest pain, shortness breath, cough or hemoptysis. She has noted some weight loss related to the diarrhea  MEDICAL HISTORY: Past Medical History  Diagnosis Date  . Lung mass   . History of radiation therapy 7/31/212-03/08/2011    right upper lobe lung adenocarcinoma  . COPD (chronic obstructive pulmonary disease)   . Hypertension   . Hypercholesterolemia   . Osteoporosis   . History of chemotherapy     carboplatin/paclitaxel  . Cancer July 2012    , right upper lobe    ALLERGIES:  is allergic to aleve and zyban.  MEDICATIONS:  Current Outpatient Prescriptions  Medication Sig Dispense Refill  . afatinib dimaleate (GILOTRIF) 30 MG tablet Take 30 mg by mouth daily. Take on an empty stomach 1hr before or 2hrs after meals.      . cetirizine (ZYRTEC) 5 MG tablet Take 5 mg by mouth daily.      . chlorhexidine (PERIDEX) 0.12 % solution Use as directed 15 mLs in the mouth or throat 2 (two) times daily.  240 mL  2  . clindamycin (CLEOCIN-T) 1 % external solution Apply topically 2 (two) times daily.  60 mL  2  . diphenoxylate-atropine (LOMOTIL) 2.5-0.025 MG per tablet Take 2 tablets by mouth 4 (four) times daily as needed for diarrhea or loose stools.  30 tablet  0  . lidocaine-prilocaine (EMLA)  cream Apply topically as needed.  30 g  1  . lidocaine-prilocaine (EMLA) cream Apply topically as needed. Use as directed  30 g  1  . loperamide (IMODIUM A-D) 2 MG tablet Take 1 tablet (2 mg total) by mouth 4 (four) times daily as needed for diarrhea or loose stools.  30 tablet  0  . losartan-hydrochlorothiazide (HYZAAR) 50-12.5 MG per tablet Take 1 tablet by mouth daily. Takes 1/2 tablet by mouth daily      . phenazopyridine  (PYRIDIUM) 100 MG tablet 1 to 2 tablets by mouth every 8 hours as needed for pain with urination  20 tablet  0  . potassium chloride SA (K-DUR,KLOR-CON) 20 MEQ tablet Take 1 tablet (20 mEq total) by mouth daily. X 7 days  7 tablet  0  . potassium chloride SA (K-DUR,KLOR-CON) 20 MEQ tablet Take 1 tablet (20 mEq total) by mouth daily.  5 tablet  0  . prochlorperazine (COMPAZINE) 10 MG tablet Take 1 tablet (10 mg total) by mouth every 6 (six) hours as needed.  30 tablet  1  . Thiamine HCl (VITAMIN B-1 PO) Take by mouth daily.      . traMADol (ULTRAM) 50 MG tablet Take 1 tablet (50 mg total) by mouth every 6 (six) hours as needed. Maximum dose= 8 tablets per day  60 tablet  0   No current facility-administered medications for this visit.    SURGICAL HISTORY:  Past Surgical History  Procedure Laterality Date  . Abdominal hysterectomy      s/p for fibroid tumor  . Portacath placement      right ij     REVIEW OF SYSTEMS:  A comprehensive review of systems was negative except for: Constitutional: positive for fatigue Gastrointestinal: positive for diarrhea Genitourinary: positive for dysuria   PHYSICAL EXAMINATION: General appearance: alert, cooperative and no distress Head: Normocephalic, without obvious abnormality, atraumatic Neck: no adenopathy Lymph nodes: Cervical, supraclavicular, and axillary nodes normal. Resp: clear to auscultation bilaterally Cardio: regular rate and rhythm, S1, S2 normal, no murmur, click, rub or gallop GI: soft, non-tender; bowel sounds normal; no masses,  no organomegaly Extremities: extremities normal, atraumatic, no cyanosis or edema Neurologic: Alert and oriented X 3, normal strength and tone. Normal symmetric reflexes. Normal coordination and gait   ECOG PERFORMANCE STATUS: 1 - Symptomatic but completely ambulatory  Blood pressure 93/58, pulse 94, temperature 97 F (36.1 C), temperature source Oral, resp. rate 18, height 5' 4.5" (1.638 m), weight 131 lb  (59.421 kg).  LABORATORY DATA: Lab Results  Component Value Date   WBC 8.8 08/10/2012   HGB 14.4 08/10/2012   HCT 43.3 08/10/2012   MCV 88.1 08/10/2012   PLT 177 08/10/2012      Chemistry      Component Value Date/Time   NA 137 08/10/2012 1153   NA 136 01/25/2012 1137   NA 142 09/16/2011 0917   K 3.3* 08/10/2012 1153   K 4.1 01/25/2012 1137   K 3.7 09/16/2011 0917   CL 101 08/10/2012 1153   CL 109 01/25/2012 1137   CL 106 09/16/2011 0917   CO2 25 08/10/2012 1153   CO2 23 01/25/2012 1137   CO2 29 09/16/2011 0917   BUN 39.6* 08/10/2012 1153   BUN 12 01/25/2012 1137   BUN 16 09/16/2011 0917   CREATININE 1.5* 08/10/2012 1153   CREATININE 0.62 01/25/2012 1137   CREATININE 0.8 09/16/2011 0917      Component Value Date/Time   CALCIUM 9.7 08/10/2012 1153  CALCIUM 9.3 01/25/2012 1137   CALCIUM 8.3 09/16/2011 0917   ALKPHOS 102 08/10/2012 1153   ALKPHOS 51 01/25/2012 1137   ALKPHOS 62 09/16/2011 0917   AST 23 08/10/2012 1153   AST 13 01/25/2012 1137   AST 18 09/16/2011 0917   ALT 19 08/10/2012 1153   ALT 14 01/25/2012 1137   BILITOT 0.81 08/10/2012 1153   BILITOT 0.5 01/25/2012 1137   BILITOT 0.60 09/16/2011 1610       ASSESSMENT/PLAN: This is a very pleasant 79 years old white female with metastatic non-small cell lung cancer, adenocarcinoma and positive EGFR mutation in exon 19, currently on therapy with Gilotrif initially at 40 mg by mouth daily status post approximately one month at that dose now taking Gliotrif at 30 mg by mouth daily secondary to significant diarrhea, status post 2 weeks of therapy.The patient was discussed with Dr. Arbutus Ped. Patient was advised to hold her Gleevec her for the next 4-5 days. She is to avoid dairy and her diet to her diarrhea resolves. She is to followup diet. She will push fluids including cranberry juice. She is given a prescription for peridium to be taken as directed for her dysuria. We will check a urinalysis and urine culture and sensitivity. For hypokalemia she will take her  potassium as prescribed, she may crush this and take it in applesauce. She will followup with Dr. Arbutus Ped in 3 weeks with a restaging CT scan of her chest abdomen and pelvis to reevaluate her disease. Her Lomotil was also refilled. Should her urine culture be positive, appropriate antibody therapy will be instituted.  Pio Eatherly E, PA-C   She was advised to call immediately if she has any concerning symptoms in the interval.  All questions were answered. The patient knows to call the clinic with any problems, questions or concerns. We can certainly see the patient much sooner if necessary.  I spent 20 minutes counseling the patient face to face. The total time spent in the appointment was 30 minutes.

## 2012-08-22 ENCOUNTER — Telehealth: Payer: Self-pay | Admitting: Medical Oncology

## 2012-08-22 MED ORDER — DIPHENOXYLATE-ATROPINE 2.5-0.025 MG PO TABS: 2.0000 | ORAL_TABLET | Freq: Four times a day (QID) | ORAL | Status: DC | PRN

## 2012-08-22 NOTE — Telephone Encounter (Signed)
Requests refill for lomotil . , having 2 loose stools daily . Also reports leg cramps "waking her up at night or in am with real bad leg cramps" She is seeing her PCP in am and will talk to her PCP about leg cramps. I sent lomotil refill for auth

## 2012-08-24 ENCOUNTER — Other Ambulatory Visit: Payer: Self-pay | Admitting: *Deleted

## 2012-08-24 MED ORDER — AFATINIB DIMALEATE 30 MG PO TABS: 30.0000 mg | ORAL_TABLET | Freq: Every day | ORAL | Status: DC

## 2012-08-28 ENCOUNTER — Other Ambulatory Visit: Payer: Self-pay | Admitting: Physician Assistant

## 2012-08-28 ENCOUNTER — Ambulatory Visit (HOSPITAL_COMMUNITY)
Admission: RE | Admit: 2012-08-28 | Discharge: 2012-08-28 | Disposition: A | Discharge status: Home / Self Care | Payer: Medicare Other | Source: Ambulatory Visit | Attending: Physician Assistant | Admitting: Physician Assistant

## 2012-08-28 ENCOUNTER — Encounter (HOSPITAL_COMMUNITY): Payer: Self-pay

## 2012-08-28 DIAGNOSIS — R197 Diarrhea, unspecified: Secondary | ICD-10-CM | POA: Insufficient documentation

## 2012-08-28 DIAGNOSIS — R634 Abnormal weight loss: Secondary | ICD-10-CM | POA: Insufficient documentation

## 2012-08-28 DIAGNOSIS — J984 Other disorders of lung: Secondary | ICD-10-CM | POA: Insufficient documentation

## 2012-08-28 DIAGNOSIS — J90 Pleural effusion, not elsewhere classified: Secondary | ICD-10-CM | POA: Insufficient documentation

## 2012-08-28 DIAGNOSIS — Y842 Radiological procedure and radiotherapy as the cause of abnormal reaction of the patient, or of later complication, without mention of misadventure at the time of the procedure: Secondary | ICD-10-CM | POA: Insufficient documentation

## 2012-08-28 DIAGNOSIS — Z79899 Other long term (current) drug therapy: Secondary | ICD-10-CM | POA: Insufficient documentation

## 2012-08-28 DIAGNOSIS — J701 Chronic and other pulmonary manifestations due to radiation: Secondary | ICD-10-CM | POA: Insufficient documentation

## 2012-08-28 DIAGNOSIS — K7200 Acute and subacute hepatic failure without coma: Secondary | ICD-10-CM | POA: Insufficient documentation

## 2012-08-28 DIAGNOSIS — C7800 Secondary malignant neoplasm of unspecified lung: Secondary | ICD-10-CM | POA: Insufficient documentation

## 2012-08-28 DIAGNOSIS — I2510 Atherosclerotic heart disease of native coronary artery without angina pectoris: Secondary | ICD-10-CM | POA: Insufficient documentation

## 2012-08-28 DIAGNOSIS — Z923 Personal history of irradiation: Secondary | ICD-10-CM | POA: Insufficient documentation

## 2012-08-28 DIAGNOSIS — C3490 Malignant neoplasm of unspecified part of unspecified bronchus or lung: Secondary | ICD-10-CM | POA: Insufficient documentation

## 2012-08-28 DIAGNOSIS — R109 Unspecified abdominal pain: Secondary | ICD-10-CM | POA: Insufficient documentation

## 2012-08-28 DIAGNOSIS — C787 Secondary malignant neoplasm of liver and intrahepatic bile duct: Secondary | ICD-10-CM | POA: Insufficient documentation

## 2012-08-28 HISTORY — DX: Secondary malignant neoplasm of liver and intrahepatic bile duct: C78.7

## 2012-08-28 MED ORDER — IOHEXOL 300 MG/ML  SOLN
50.0000 mL | INTRAMUSCULAR | Status: AC
  Administered 2012-08-28: 50 mL via ORAL

## 2012-08-31 ENCOUNTER — Encounter: Payer: Self-pay | Admitting: Internal Medicine

## 2012-08-31 ENCOUNTER — Ambulatory Visit (HOSPITAL_BASED_OUTPATIENT_CLINIC_OR_DEPARTMENT_OTHER): Payer: Medicare Other | Admitting: Internal Medicine

## 2012-08-31 ENCOUNTER — Ambulatory Visit: Payer: Medicare Other

## 2012-08-31 ENCOUNTER — Other Ambulatory Visit: Payer: Self-pay | Admitting: *Deleted

## 2012-08-31 ENCOUNTER — Other Ambulatory Visit (HOSPITAL_BASED_OUTPATIENT_CLINIC_OR_DEPARTMENT_OTHER): Payer: Medicare Other | Admitting: Lab

## 2012-08-31 ENCOUNTER — Telehealth: Payer: Self-pay | Admitting: Internal Medicine

## 2012-08-31 DIAGNOSIS — C3410 Malignant neoplasm of upper lobe, unspecified bronchus or lung: Secondary | ICD-10-CM

## 2012-08-31 LAB — CBC WITH DIFFERENTIAL/PLATELET
Basophils Absolute: 0 10*3/uL (ref 0.0–0.1)
EOS%: 7.5 % — ABNORMAL HIGH (ref 0.0–7.0)
Eosinophils Absolute: 0.4 10*3/uL (ref 0.0–0.5)
HCT: 37.8 % (ref 34.8–46.6)
HGB: 12.6 g/dL (ref 11.6–15.9)
MCH: 29.8 pg (ref 25.1–34.0)
MCV: 89 fL (ref 79.5–101.0)
MONO%: 7.4 % (ref 0.0–14.0)
NEUT#: 4.5 10*3/uL (ref 1.5–6.5)
NEUT%: 79.1 % — ABNORMAL HIGH (ref 38.4–76.8)
RDW: 15.3 % — ABNORMAL HIGH (ref 11.2–14.5)

## 2012-08-31 LAB — COMPREHENSIVE METABOLIC PANEL (CC13)
Albumin: 3.2 g/dL — ABNORMAL LOW (ref 3.5–5.0)
Alkaline Phosphatase: 84 U/L (ref 40–150)
BUN: 19.5 mg/dL (ref 7.0–26.0)
CO2: 24 mEq/L (ref 22–29)
Glucose: 88 mg/dl (ref 70–99)
Potassium: 3 mEq/L — CL (ref 3.5–5.1)
Sodium: 143 mEq/L (ref 136–145)
Total Bilirubin: 0.54 mg/dL (ref 0.20–1.20)
Total Protein: 6.4 g/dL (ref 6.4–8.3)

## 2012-08-31 MED ORDER — SODIUM CHLORIDE 0.9 % IJ SOLN
10.0000 mL | INTRAMUSCULAR | Status: DC | PRN
  Administered 2012-08-31: 10 mL via INTRAVENOUS
  Filled 2012-08-31: qty 10

## 2012-08-31 MED ORDER — DIPHENOXYLATE-ATROPINE 2.5-0.025 MG PO TABS: 2.0000 | ORAL_TABLET | Freq: Four times a day (QID) | ORAL | Status: DC | PRN

## 2012-08-31 MED ORDER — POTASSIUM CHLORIDE CRYS ER 20 MEQ PO TBCR: 40.0000 meq | EXTENDED_RELEASE_TABLET | Freq: Every day | ORAL | Status: DC

## 2012-08-31 MED ORDER — HEPARIN SOD (PORK) LOCK FLUSH 100 UNIT/ML IV SOLN
500.0000 [IU] | Freq: Once | INTRAVENOUS | Status: AC
  Administered 2012-08-31: 500 [IU] via INTRAVENOUS
  Filled 2012-08-31: qty 5

## 2012-08-31 NOTE — Telephone Encounter (Signed)
gv and printed appt schedule for pt for April °

## 2012-08-31 NOTE — Progress Notes (Signed)
[facility] Telephone:([phone]   Fax:(336) [phone]  OFFICE PROGRESS NOTE  [name], MD [address] [facility], [place]. [place] [address]  DIAGNOSIS: Recurrent non-small cell lung cancer, adenocarcinoma with positive EGFR mutation in exon 19 and negative ALK gene translocation initially diagnosed as stage IIIA in (T1b N2 M0) in July 2012.   PRIOR THERAPY:  #1 Status post concurrent chemoradiation with weekly carboplatin and paclitaxel, last dose was given 03/01/2011.  #2 status post 3 seconds of consolidation chemotherapy was carboplatin for AUC of 5 and Alimta 500 mg/M2 giving him to see weeks, last dose was given 05/27/2011.  # 3 systemic chemotherapy with carboplatin for AUC of 5, paclitaxel 175 mg/M2 and Avastin 15 mg/kg every 3 weeks. Status post 6 cycles.  #4 maintenance therapy with the Avastin 15 mg/kg every 3 weeks, status post 6 cycles, last dose was given on 06/02/2012 discontinued today secondary to disease progression.  #5 Gilotrif (Afatinib) 40 mg by mouth daily started 06/27/2012. Status post approximately 1 month of therapy.   CURRENT THERAPY:Gilotrif (Afatinib) 30 mg by mouth daily status post approximately one month of therapy   INTERVAL HISTORY: [name] 79 y.o. female returns to the clinic today for followup visit accompanied by her husband, daughter and grandson. The patient is tolerating her treatment with Gilotrif fairly well except for persistent diarrhea up to 6 times daily. She is currently on Imodium and use Lomotil 2-3 times a day. She has very mild skin rash. She denied having any significant weight loss or night sweats. Her diarrhea increased after the oral contrast for her recent CT scan. She had a recent CT scan of the chest, abdomen and pelvis for restaging of her disease and the patient is here today for evaluation and discussion of her treatment options.  MEDICAL HISTORY: Past Medical History    Diagnosis Date  . Lung mass   . History of radiation therapy 7/31/212-03/08/2011    right upper lobe lung adenocarcinoma  . COPD (chronic obstructive pulmonary disease)   . Hypertension   . Hypercholesterolemia   . Osteoporosis   . History of chemotherapy     carboplatin/paclitaxel  . lung ca July 2012    , right upper lobe  . Liver metastases dx'd 06/16/12    ALLERGIES:  is allergic to aleve and zyban.  MEDICATIONS:  Current Outpatient Prescriptions  Medication Sig Dispense Refill  . afatinib dimaleate (GILOTRIF) 30 MG tablet Take 1 tablet (30 mg total) by mouth daily. Take on an empty stomach 1hr before or 2hrs after meals.  30 tablet  0  . cetirizine (ZYRTEC) 5 MG tablet Take 5 mg by mouth daily.      . chlorhexidine (PERIDEX) 0.12 % solution Use as directed 15 mLs in the mouth or throat 2 (two) times daily.  240 mL  2  . clindamycin (CLEOCIN-T) 1 % external solution Apply topically 2 (two) times daily.  60 mL  2  . diphenoxylate-atropine (LOMOTIL) 2.5-0.025 MG per tablet Take 2 tablets by mouth 4 (four) times daily as needed for diarrhea or loose stools.  30 tablet  0  . lidocaine-prilocaine (EMLA) cream Apply topically as needed. Use as directed  30 g  1  . loperamide (IMODIUM A-D) 2 MG tablet Take 1 tablet (2 mg total) by mouth 4 (four) times daily as needed for diarrhea or loose stools.  30 tablet  0  . losartan-hydrochlorothiazide (HYZAAR) 50-12.5 MG per tablet Take  1 tablet by mouth daily. Takes 1/2 tablet by mouth daily      . phenazopyridine (PYRIDIUM) 100 MG tablet 1 to 2 tablets by mouth every 8 hours as needed for pain with urination  20 tablet  0  . potassium chloride SA (K-DUR,KLOR-CON) 20 MEQ tablet Take 1 tablet (20 mEq total) by mouth daily.  5 tablet  0  . prochlorperazine (COMPAZINE) 10 MG tablet Take 1 tablet (10 mg total) by mouth every 6 (six) hours as needed.  30 tablet  1  . Thiamine HCl (VITAMIN B-1 PO) Take by mouth daily.      . traMADol (ULTRAM) 50 MG  tablet Take 1 tablet (50 mg total) by mouth every 6 (six) hours as needed. Maximum dose= 8 tablets per day  60 tablet  0   No current facility-administered medications for this visit.    SURGICAL HISTORY:  Past Surgical History  Procedure Laterality Date  . Abdominal hysterectomy      s/p for fibroid tumor  . Portacath placement      right ij     REVIEW OF SYSTEMS:  A comprehensive review of systems was negative except for: Constitutional: positive for fatigue Gastrointestinal: positive for diarrhea   PHYSICAL EXAMINATION: General appearance: alert, cooperative and no distress Head: Normocephalic, without obvious abnormality, atraumatic Neck: no adenopathy Lymph nodes: Cervical, supraclavicular, and axillary nodes normal. Resp: clear to auscultation bilaterally Cardio: regular rate and rhythm, S1, S2 normal, no murmur, click, rub or gallop GI: soft, non-tender; bowel sounds normal; no masses,  no organomegaly Extremities: extremities normal, atraumatic, no cyanosis or edema Neurologic: Alert and oriented X 3, normal strength and tone. Normal symmetric reflexes. Normal coordination and gait  ECOG PERFORMANCE STATUS: 1 - Symptomatic but completely ambulatory  Blood pressure 130/72, pulse 93, temperature 98.8 F (37.1 C), temperature source Oral, resp. rate 18, height 5' 4.5" (1.638 m), weight 131 lb 3.2 oz (59.512 kg).  LABORATORY DATA: Lab Results  Component Value Date   WBC 5.6 08/31/2012   HGB 12.6 08/31/2012   HCT 37.8 08/31/2012   MCV 89.0 08/31/2012   PLT 182 08/31/2012      Chemistry      Component Value Date/Time   NA 137 08/10/2012 1153   NA 136 01/25/2012 1137   NA 142 09/16/2011 0917   K 3.3* 08/10/2012 1153   K 4.1 01/25/2012 1137   K 3.7 09/16/2011 0917   CL 101 08/10/2012 1153   CL 109 01/25/2012 1137   CL 106 09/16/2011 0917   CO2 25 08/10/2012 1153   CO2 23 01/25/2012 1137   CO2 29 09/16/2011 0917   BUN 39.6* 08/10/2012 1153   BUN 12 01/25/2012 1137   BUN 16 09/16/2011  0917   CREATININE 1.5* 08/10/2012 1153   CREATININE 0.62 01/25/2012 1137   CREATININE 0.8 09/16/2011 0917      Component Value Date/Time   CALCIUM 9.7 08/10/2012 1153   CALCIUM 9.3 01/25/2012 1137   CALCIUM 8.3 09/16/2011 0917   ALKPHOS 102 08/10/2012 1153   ALKPHOS 51 01/25/2012 1137   ALKPHOS 62 09/16/2011 0917   AST 23 08/10/2012 1153   AST 13 01/25/2012 1137   AST 18 09/16/2011 0917   ALT 19 08/10/2012 1153   ALT 14 01/25/2012 1137   BILITOT 0.81 08/10/2012 1153   BILITOT 0.5 01/25/2012 1137   BILITOT 0.60 09/16/2011 0917       RADIOGRAPHIC STUDIES: Ct Chest Wo Contrast  08/28/2012  *RADIOLOGY REPORT*  Clinical  Data:  Lung cancer with hepatic metastases.  Ongoing chemotherapy.  Radiation therapy completed to September 2012. Weight loss with abdominal pain and diarrhea.  CT CHEST, ABDOMEN AND PELVIS WITHOUT CONTRAST  Technique:  Multidetector CT imaging of the chest, abdomen and pelvis was performed following the standard protocol without IV contrast.  Comparison:  06/16/2012.   CT CHEST  Findings:  No pathologically enlarged mediastinal or axillary lymph nodes.  Hilar regions are difficult to definitively evaluate without IV contrast.  Coronary artery calcification and stenting. Heart size normal.  No pericardial effusion.  Small right pleural effusion, stable.  Evolving radiation fibrosis and volume loss in the medial right hemithorax. Associated nodular components in the medial right lower lobe are unchanged.  There has been interval improvement in diffuse micro nodularity throughout the lungs, basilar predominant, without complete resolution.  An irregular nodular density in the right lower lobe measures 9 mm (image 44).  Airway is unremarkable.  IMPRESSION:  1.  Interval response to therapy as evidenced by decrease in size and number of widespread miliary pulmonary parenchymal metastases, without complete resolution. 2.  Irregular nodular density in the right lower lobe.  Continued attention on follow-up  exams is warranted. 3.  Stable radiation fibrosis and volume loss in the right hemithorax. Associated nodular components in the medial right lower lobe are unchanged. 4.  Stable small right pleural effusion.   CT ABDOMEN AND PELVIS  Findings:  Progressive left hepatic lobe atrophy with areas of low attenuation.  Sludge and stones in the gallbladder. Adrenal glands, kidneys, spleen, pancreas, stomach and bowel are unremarkable.  Atherosclerotic calcification of the arterial vasculature without abdominal aortic aneurysm.  A bifemoral bypass graft is in place. No pathologically enlarged lymph nodes.  No free fluid. Hysterectomy.  A sclerotic lesion in the T10 vertebral body is unchanged.  IMPRESSION:  1.  Progressive left hepatic lobe atrophy, presumably related to metastatic disease.  This area is poorly evaluated in the absence of IV contrast. 2.  Interval resolution of previously seen retroperitoneal abdominal and pelvic adenopathy.   Original Report Authenticated By: Leanna Battles, M.D.     ASSESSMENT: This is a very pleasant 79 years old white female with recurrent non-small cell lung cancer, adenocarcinoma with positive EGFR mutation in exon 19 currently on treatment with Gilotrif 30 mg by mouth daily. The patient is tolerating her treatment well except for persistent diarrhea which was worse after the oral contrast for the recent scan. She had significant improvement in her disease after treatment with Gilotrif  PLAN: I discussed the scan results with the patient and her family. I recommended for her to continue on Gilotrif with the current dose 30 mg by mouth daily. She was advised to take Imodium 1-2 mg after every loose stool with a maximum of 16 mg a day. The patient was also advised to take Lomotil 2 tablets by mouth up to 4 times a day as needed. She would come back for followup visit in 4 weeks for reevaluation. For hypokalemia she would be started on K Dur 40 mEq by mouth daily for the next 7  days. She was advised to call immediately if she has any concerning symptoms in the interval.  All questions were answered. The patient knows to call the clinic with any problems, questions or concerns. We can certainly see the patient much sooner if necessary.  I spent 15 minutes counseling the patient face to face. The total time spent in the appointment was 25 minutes.

## 2012-08-31 NOTE — Patient Instructions (Signed)
Call MD for problems or concerns 

## 2012-09-02 NOTE — Patient Instructions (Signed)
Your scan showed significant improvement in her disease with Gilotrif. Continue treatment was Gilotrif at the same dose. Continue Imodium and Lomotil for the diarrhea. Potassium supplement today for the hypokalemia. Followup visit in 4 weeks.

## 2012-09-04 ENCOUNTER — Telehealth: Payer: Self-pay | Admitting: Dietician

## 2012-09-04 NOTE — Telephone Encounter (Signed)
Brief Outpatient Oncology Nutrition Note  Patient has been identified to be at risk on malnutrition screen.   Wt Readings from Last 10 Encounters:  08/31/12 131 lb 3.2 oz (59.512 kg)  08/10/12 131 lb (59.421 kg)  07/25/12 137 lb 4.8 oz (62.279 kg)  07/11/12 139 lb (63.05 kg)  06/22/12 141 lb (63.957 kg)  06/20/12 143 lb (64.864 kg)  05/30/12 144 lb 1.6 oz (65.363 kg)  05/09/12 144 lb 9.6 oz (65.59 kg)  03/07/12 146 lb 3.2 oz (66.316 kg)  02/15/12 147 lb 8 oz (66.906 kg)    Patient with 12% weight loss in the last 8 months.  Called patient who was sleeping.  Spoke with patient's husband.  Patient is having a lot of problems with diarrhea.  Encouraged him to have her call if she had any questions or wanted an appointment with the outpatient [facility].    I will send info in improving nutrition and diet tips with diarrhea, "making the most of each bite", coupons for Ensure Clear/Ensure and RD name and number.  Oran Rein, RD, LDN

## 2012-09-05 ENCOUNTER — Other Ambulatory Visit: Payer: Self-pay | Admitting: *Deleted

## 2012-09-05 ENCOUNTER — Telehealth: Payer: Self-pay | Admitting: *Deleted

## 2012-09-05 ENCOUNTER — Telehealth: Payer: Self-pay | Admitting: Internal Medicine

## 2012-09-05 MED ORDER — DIPHENOXYLATE-ATROPINE 2.5-0.025 MG PO TABS: 2.0000 | ORAL_TABLET | Freq: Four times a day (QID) | ORAL | Status: DC | PRN

## 2012-09-05 NOTE — Telephone Encounter (Signed)
Pt called stating that she is having burning with urination.  Per Dr Donnald Garre, okay for pt to come in for a UA and Culture.  Pt aware that schedulers will call with an appt.  SLJ

## 2012-09-05 NOTE — Telephone Encounter (Signed)
s.w. pt and advised on 3.19.14 lab

## 2012-09-06 ENCOUNTER — Other Ambulatory Visit (HOSPITAL_BASED_OUTPATIENT_CLINIC_OR_DEPARTMENT_OTHER): Payer: Medicare Other

## 2012-09-06 DIAGNOSIS — R300 Dysuria: Secondary | ICD-10-CM

## 2012-09-06 LAB — URINALYSIS, MICROSCOPIC - CHCC
Bilirubin (Urine): NEGATIVE
Glucose: NEGATIVE mg/dL
Ketones: NEGATIVE mg/dL
Leukocyte Esterase: NEGATIVE
Nitrite: NEGATIVE
RBC / HPF: NEGATIVE (ref 0–2)

## 2012-09-15 ENCOUNTER — Telehealth: Payer: Self-pay | Admitting: Dietician

## 2012-09-15 NOTE — Telephone Encounter (Signed)
Brief Outpatient Oncology Nutrition Note  Patient has been identified to be at risk on malnutrition screen.   Wt Readings from Last 10 Encounters:  08/31/12 131 lb 3.2 oz (59.512 kg)  08/10/12 131 lb (59.421 kg)  07/25/12 137 lb 4.8 oz (62.279 kg)  07/11/12 139 lb (63.05 kg)  06/22/12 141 lb (63.957 kg)  06/20/12 143 lb (64.864 kg)  05/30/12 144 lb 1.6 oz (65.363 kg)  05/09/12 144 lb 9.6 oz (65.59 kg)  03/07/12 146 lb 3.2 oz (66.316 kg)  02/15/12 147 lb 8 oz (66.906 kg)    Called patient secondary to weight loss.  Patient was unavailable.  Contact info provided  Oran Rein, RD, LDN

## 2012-09-19 ENCOUNTER — Other Ambulatory Visit: Payer: Self-pay | Admitting: Internal Medicine

## 2012-09-20 ENCOUNTER — Encounter: Payer: Self-pay | Admitting: Internal Medicine

## 2012-09-20 NOTE — Progress Notes (Signed)
NO LONGER ON IV CHEMO SHE IS ON ORAL PER [facility].

## 2012-09-25 ENCOUNTER — Telehealth: Payer: Self-pay | Admitting: *Deleted

## 2012-09-25 ENCOUNTER — Other Ambulatory Visit: Payer: Self-pay | Admitting: Internal Medicine

## 2012-09-25 NOTE — Telephone Encounter (Signed)
Patient called and moved her flush appt from 4/23 to 4/10.  JMW

## 2012-09-28 ENCOUNTER — Ambulatory Visit (HOSPITAL_BASED_OUTPATIENT_CLINIC_OR_DEPARTMENT_OTHER): Payer: Medicare Other

## 2012-09-28 ENCOUNTER — Telehealth: Payer: Self-pay | Admitting: Internal Medicine

## 2012-09-28 ENCOUNTER — Ambulatory Visit (HOSPITAL_BASED_OUTPATIENT_CLINIC_OR_DEPARTMENT_OTHER): Payer: Medicare Other | Admitting: Physician Assistant

## 2012-09-28 ENCOUNTER — Other Ambulatory Visit (HOSPITAL_BASED_OUTPATIENT_CLINIC_OR_DEPARTMENT_OTHER): Payer: Medicare Other | Admitting: Lab

## 2012-09-28 ENCOUNTER — Telehealth: Payer: Self-pay | Admitting: *Deleted

## 2012-09-28 DIAGNOSIS — R197 Diarrhea, unspecified: Secondary | ICD-10-CM

## 2012-09-28 DIAGNOSIS — C3490 Malignant neoplasm of unspecified part of unspecified bronchus or lung: Secondary | ICD-10-CM

## 2012-09-28 DIAGNOSIS — E876 Hypokalemia: Secondary | ICD-10-CM

## 2012-09-28 DIAGNOSIS — R300 Dysuria: Secondary | ICD-10-CM

## 2012-09-28 DIAGNOSIS — C3410 Malignant neoplasm of upper lobe, unspecified bronchus or lung: Secondary | ICD-10-CM

## 2012-09-28 LAB — COMPREHENSIVE METABOLIC PANEL (CC13)
Albumin: 3.4 g/dL — ABNORMAL LOW (ref 3.5–5.0)
Alkaline Phosphatase: 78 U/L (ref 40–150)
BUN: 34.7 mg/dL — ABNORMAL HIGH (ref 7.0–26.0)
Creatinine: 1.3 mg/dL — ABNORMAL HIGH (ref 0.6–1.1)
Glucose: 98 mg/dl (ref 70–99)
Potassium: 2.4 mEq/L — CL (ref 3.5–5.1)

## 2012-09-28 LAB — CBC WITH DIFFERENTIAL/PLATELET
Basophils Absolute: 0 10*3/uL (ref 0.0–0.1)
EOS%: 4.8 % (ref 0.0–7.0)
HCT: 38.4 % (ref 34.8–46.6)
HGB: 13.1 g/dL (ref 11.6–15.9)
LYMPH%: 4.4 % — ABNORMAL LOW (ref 14.0–49.7)
MCH: 29.4 pg (ref 25.1–34.0)
MCHC: 34.1 g/dL (ref 31.5–36.0)
MCV: 86.3 fL (ref 79.5–101.0)
MONO%: 9.2 % (ref 0.0–14.0)
NEUT%: 81.1 % — ABNORMAL HIGH (ref 38.4–76.8)
Platelets: 209 10*3/uL (ref 145–400)
lymph#: 0.4 10*3/uL — ABNORMAL LOW (ref 0.9–3.3)

## 2012-09-28 MED ORDER — SODIUM CHLORIDE 0.9 % IJ SOLN
10.0000 mL | INTRAMUSCULAR | Status: DC | PRN
  Administered 2012-09-28: 10 mL via INTRAVENOUS
  Filled 2012-09-28: qty 10

## 2012-09-28 MED ORDER — PHENAZOPYRIDINE HCL 100 MG PO TABS: ORAL_TABLET | ORAL | Status: DC

## 2012-09-28 MED ORDER — POTASSIUM CHLORIDE CRYS ER 20 MEQ PO TBCR: EXTENDED_RELEASE_TABLET | ORAL | Status: DC

## 2012-09-28 MED ORDER — HEPARIN SOD (PORK) LOCK FLUSH 100 UNIT/ML IV SOLN
500.0000 [IU] | Freq: Once | INTRAVENOUS | Status: AC
  Administered 2012-09-28: 500 [IU] via INTRAVENOUS
  Filled 2012-09-28: qty 5

## 2012-09-28 NOTE — Telephone Encounter (Signed)
Called and left message on home vm with instructions regarding x 10 days.

## 2012-09-28 NOTE — Patient Instructions (Addendum)
Hold your Gilotrif for the next 5 days do to your frequent episodes of diarrhea, then restart at the current dose Increase the fiber in your diet Take the potassium supplementation 1 tablet by mouth twice daily for the next 10 days for your hypokalemia Followup in one month for another symptom management visit

## 2012-09-29 ENCOUNTER — Telehealth: Payer: Self-pay | Admitting: *Deleted

## 2012-09-29 NOTE — Telephone Encounter (Signed)
Returning pt call, no answer

## 2012-09-29 NOTE — Progress Notes (Signed)
Quick Note:  Call patient with the result and order KDur 40 meq po Qd x10 days. ______

## 2012-10-02 ENCOUNTER — Telehealth: Payer: Self-pay | Admitting: Medical Oncology

## 2012-10-02 NOTE — Progress Notes (Addendum)
[facility] Telephone:(336) [phone]   Fax:(336) [phone]  OFFICE PROGRESS NOTE  [name], MD [address] [facility], [place]. [place] [address]  DIAGNOSIS: Recurrent non-small cell lung cancer, adenocarcinoma with positive EGFR mutation in exon 19 and negative ALK gene translocation initially diagnosed as stage IIIA in (T1b N2 M0) in July 2012.   PRIOR THERAPY:  #1 Status post concurrent chemoradiation with weekly carboplatin and paclitaxel, last dose was given 03/01/2011.  #2 status post 3 seconds of consolidation chemotherapy was carboplatin for AUC of 5 and Alimta 500 mg/M2 giving him to see weeks, last dose was given 05/27/2011.  # 3 systemic chemotherapy with carboplatin for AUC of 5, paclitaxel 175 mg/M2 and Avastin 15 mg/kg every 3 weeks. Status post 6 cycles.  #4 maintenance therapy with the Avastin 15 mg/kg every 3 weeks, status post 6 cycles, last dose was given on 06/02/2012 discontinued today secondary to disease progression.  #5 Gilotrif (Afatinib) 40 mg by mouth daily started 06/27/2012. Status post approximately 1 month of therapy.   CURRENT THERAPY:Gilotrif (Afatinib) 30 mg by mouth daily status post approximately 2 months of therapy   INTERVAL HISTORY: [name] 79 y.o. female returns to the clinic today for followup visit accompanied by her husband.  The patient is tolerating her treatment with Gilotrif fairly well except for persistent diarrhea with up to 6 episodes daily. She is currently on Imodium and use Lomotil 2-3 times a day. She complains of having no energy. She has very mild skin rash.  She reports some burning with urination or requests a refill of the pyridium that was helpful last time she had similar symptoms. She denied having any significant weight loss or night sweats.   MEDICAL HISTORY: Past Medical History  Diagnosis Date  . Lung mass   . History of radiation therapy 7/31/212-03/08/2011    right  upper lobe lung adenocarcinoma  . COPD (chronic obstructive pulmonary disease)   . Hypertension   . Hypercholesterolemia   . Osteoporosis   . History of chemotherapy     carboplatin/paclitaxel  . lung ca July 2012    , right upper lobe  . Liver metastases dx'd 06/16/12    ALLERGIES:  is allergic to aleve and zyban.  MEDICATIONS:  Current Outpatient Prescriptions  Medication Sig Dispense Refill  . cetirizine (ZYRTEC) 5 MG tablet Take 5 mg by mouth daily.      . chlorhexidine (PERIDEX) 0.12 % solution Use as directed 15 mLs in the mouth or throat 2 (two) times daily.  240 mL  2  . clindamycin (CLEOCIN-T) 1 % external solution Apply topically 2 (two) times daily.  60 mL  2  . diphenoxylate-atropine (LOMOTIL) 2.5-0.025 MG per tablet TAKE 2 TABLETS BY MOUTH 4 TIMES A DAY AS NEEDED FOR DIARRHEA OR LOOSE STOOL.  60 tablet  0  . GILOTRIF 30 MG tablet TAKE 1 TABLET BY MOUTH DAILY ON AN EMPTY STOMACH 1 HOUR BEFORE OR 2 HOURS AFTER MEALS  30 tablet  0  . lidocaine-prilocaine (EMLA) cream Apply topically as needed. Use as directed  30 g  1  . loperamide (IMODIUM A-D) 2 MG tablet Take 1 tablet (2 mg total) by mouth 4 (four) times daily as needed for diarrhea or loose stools.  30 tablet  0  . losartan-hydrochlorothiazide (HYZAAR) 50-12.5 MG per tablet Take 1 tablet by mouth daily. Takes 1/2 tablet by mouth daily      . phenazopyridine (PYRIDIUM)  100 MG tablet 1 to 2 tablets by mouth every 8 hours as needed for pain with urination  20 tablet  0  . potassium chloride SA (K-DUR,KLOR-CON) 20 MEQ tablet Take x 10 days.  20 tablet  0  . prochlorperazine (COMPAZINE) 10 MG tablet Take 1 tablet (10 mg total) by mouth every 6 (six) hours as needed.  30 tablet  1  . traMADol (ULTRAM) 50 MG tablet Take 1 tablet (50 mg total) by mouth every 6 (six) hours as needed. Maximum dose= 8 tablets per day  60 tablet  0   No current facility-administered medications for this visit.    SURGICAL HISTORY:  Past  Surgical History  Procedure Laterality Date  . Abdominal hysterectomy      s/p for fibroid tumor  . Portacath placement      right ij     REVIEW OF SYSTEMS:  A comprehensive review of systems was negative except for: Constitutional: positive for fatigue Gastrointestinal: positive for diarrhea Mild skin rash   PHYSICAL EXAMINATION: General appearance: alert, cooperative and no distress Head: Normocephalic, without obvious abnormality, atraumatic Neck: no adenopathy Lymph nodes: Cervical, supraclavicular, and axillary nodes normal. Resp: clear to auscultation bilaterally Cardio: regular rate and rhythm, S1, S2 normal, no murmur, click, rub or gallop GI: soft, non-tender; bowel sounds normal; no masses,  no organomegaly Extremities: extremities normal, atraumatic, no cyanosis or edema Neurologic: Alert and oriented X 3, normal strength and tone. Normal symmetric reflexes. Normal coordination and gait  ECOG PERFORMANCE STATUS: 1 - Symptomatic but completely ambulatory  Blood pressure 101/70, pulse 75, temperature 96.6 F (35.9 C), temperature source Oral, resp. rate 20, height 5' 4.5" (1.638 m), weight 126 lb 3.2 oz (57.244 kg).  LABORATORY DATA: Lab Results  Component Value Date   WBC 8.0 09/28/2012   HGB 13.1 09/28/2012   HCT 38.4 09/28/2012   MCV 86.3 09/28/2012   PLT 209 09/28/2012      Chemistry      Component Value Date/Time   NA 136 09/28/2012 1108   NA 136 01/25/2012 1137   NA 142 09/16/2011 0917   K 2.4 Repeated and Verified* 09/28/2012 1108   K 4.1 01/25/2012 1137   K 3.7 09/16/2011 0917   CL 103 09/28/2012 1108   CL 109 01/25/2012 1137   CL 106 09/16/2011 0917   CO2 22 09/28/2012 1108   CO2 23 01/25/2012 1137   CO2 29 09/16/2011 0917   BUN 34.7* 09/28/2012 1108   BUN 12 01/25/2012 1137   BUN 16 09/16/2011 0917   CREATININE 1.3* 09/28/2012 1108   CREATININE 0.62 01/25/2012 1137   CREATININE 0.8 09/16/2011 0917      Component Value Date/Time   CALCIUM 9.1 09/28/2012 1108   CALCIUM  9.3 01/25/2012 1137   CALCIUM 8.3 09/16/2011 0917   ALKPHOS 78 09/28/2012 1108   ALKPHOS 51 01/25/2012 1137   ALKPHOS 62 09/16/2011 0917   AST 18 09/28/2012 1108   AST 13 01/25/2012 1137   AST 18 09/16/2011 0917   ALT 12 09/28/2012 1108   ALT 14 01/25/2012 1137   BILITOT 0.57 09/28/2012 1108   BILITOT 0.5 01/25/2012 1137   BILITOT 0.60 09/16/2011 0917       RADIOGRAPHIC STUDIES: Ct Chest Wo Contrast  08/28/2012  *RADIOLOGY REPORT*  Clinical Data:  Lung cancer with hepatic metastases.  Ongoing chemotherapy.  Radiation therapy completed to September 2012. Weight loss with abdominal pain and diarrhea.  CT CHEST, ABDOMEN AND PELVIS WITHOUT CONTRAST  Technique:  Multidetector CT imaging of the chest, abdomen and pelvis was performed following the standard protocol without IV contrast.  Comparison:  06/16/2012.   CT CHEST  Findings:  No pathologically enlarged mediastinal or axillary lymph nodes.  Hilar regions are difficult to definitively evaluate without IV contrast.  Coronary artery calcification and stenting. Heart size normal.  No pericardial effusion.  Small right pleural effusion, stable.  Evolving radiation fibrosis and volume loss in the medial right hemithorax. Associated nodular components in the medial right lower lobe are unchanged.  There has been interval improvement in diffuse micro nodularity throughout the lungs, basilar predominant, without complete resolution.  An irregular nodular density in the right lower lobe measures 9 mm (image 44).  Airway is unremarkable.  IMPRESSION:  1.  Interval response to therapy as evidenced by decrease in size and number of widespread miliary pulmonary parenchymal metastases, without complete resolution. 2.  Irregular nodular density in the right lower lobe.  Continued attention on follow-up exams is warranted. 3.  Stable radiation fibrosis and volume loss in the right hemithorax. Associated nodular components in the medial right lower lobe are unchanged. 4.  Stable  small right pleural effusion.   CT ABDOMEN AND PELVIS  Findings:  Progressive left hepatic lobe atrophy with areas of low attenuation.  Sludge and stones in the gallbladder. Adrenal glands, kidneys, spleen, pancreas, stomach and bowel are unremarkable.  Atherosclerotic calcification of the arterial vasculature without abdominal aortic aneurysm.  A bifemoral bypass graft is in place. No pathologically enlarged lymph nodes.  No free fluid. Hysterectomy.  A sclerotic lesion in the T10 vertebral body is unchanged.  IMPRESSION:  1.  Progressive left hepatic lobe atrophy, presumably related to metastatic disease.  This area is poorly evaluated in the absence of IV contrast. 2.  Interval resolution of previously seen retroperitoneal abdominal and pelvic adenopathy.   Original Report Authenticated By: Leanna Battles, M.D.     ASSESSMENT/PLAN: This is a very pleasant 79 years old white female with recurrent non-small cell lung cancer, adenocarcinoma with positive EGFR mutation in exon 19 currently on treatment with Gilotrif 30 mg by mouth daily. The patient is tolerating her treatment well except for persistent diarrhea which was worse after the oral contrast for the recent scan. She had significant improvement in her disease after treatment with Gilotrif. The patient was discussed with Dr. Arbutus Ped. She will hold her Gilotrif for the next 5 days secondary to the frequency of diarrhea. She'll follow a Bratt diet and add fiber were diet the fiber tablets. She is hypokalemic today with a potassium of 2.4. A prescription for potassium chloride was sent to her pharmacy of record with instructions to take 40 mEq daily for the next 10 days. Patient was notified and voiced understanding. She will resume the Gilotrif after the 5 day rest period at the current dose of 30 mg by mouth daily. She'll return in one month for a symptom management visit with repeat CBC differential and C. met. A prescription for pyridium is refilled for  her complaints of dysuria and we will also obtain a clean catch urinalysis and urine C&S to evaluate for possible urinary tract infection.  Shawn Carattini E, PA-C   She was advised to call immediately if she has any concerning symptoms in the interval.  All questions were answered. The patient knows to call the clinic with any problems, questions or concerns. We can certainly see the patient much sooner if necessary.  I spent 20 minutes counseling  the patient face to face. The total time spent in the appointment was 30 minutes.

## 2012-10-02 NOTE — Telephone Encounter (Signed)
Message copied by Charma Igo on Mon Oct 02, 2012  4:19 PM ------      Message from: [facility], [place]      Created: Fri Sep 29, 2012  3:55 PM       Call patient with the result and order KDur 40 meq po Qd x10 days. ------

## 2012-10-02 NOTE — Telephone Encounter (Signed)
done

## 2012-10-13 ENCOUNTER — Other Ambulatory Visit: Payer: Self-pay | Admitting: Internal Medicine

## 2012-10-23 ENCOUNTER — Telehealth: Payer: Self-pay | Admitting: *Deleted

## 2012-10-23 NOTE — Telephone Encounter (Signed)
Pt called stating that he has some new sore on her foot. No pus or bleeding noted.  Per dr Donnald Garre, okay to keep them dry, clean them BID, and keep them covered up and we will reassess them on Wednesday 10/25/12.  SLJ

## 2012-10-25 IMAGING — CT CT CHEST W/ CM
2 of 5 series · 16 of 46 positions shown, 18 images · IV contrast ([ID] [id number])
Comparison: CT [DATE] 7974

CT CHEST

CLINICAL DATA: Chemotherapy in progress.  Radiation therapy
complete.  Lung cancer diagnosed in December 2010.

CT CHEST, ABDOMEN AND PELVIS WITH CONTRAST
TECHNIQUE: Multidetector CT imaging of the chest, abdomen and
pelvis was performed following the standard protocol during bolus
administration of intravenous contrast.
Contrast:  100 ml Omnipaque 300

[Series 2: cap with st · axial · 0.77mm/px · z∈[-388,+192]mm · 13 of 132 slices shown, 15 images]
[im 8/132  soft-tissue]
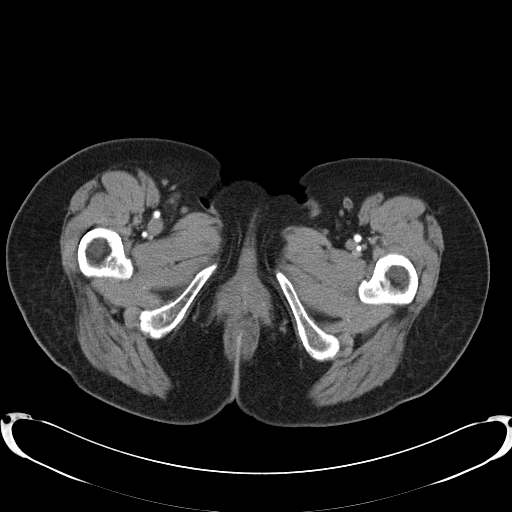
[im 8/132  bone]
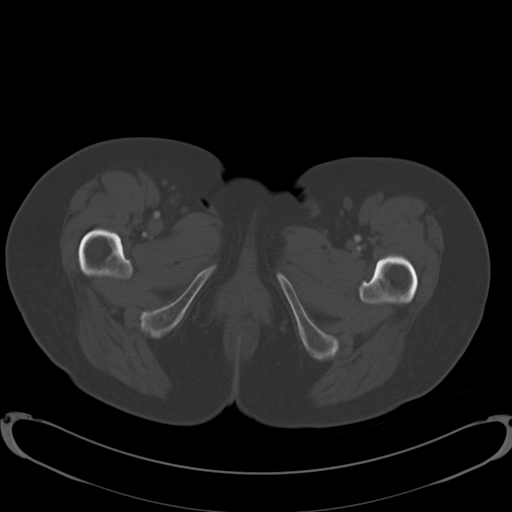
[im 15/132  soft-tissue]
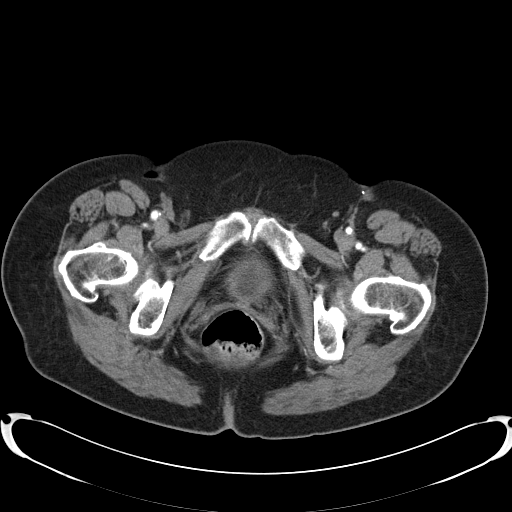
[im 30/132  soft-tissue]
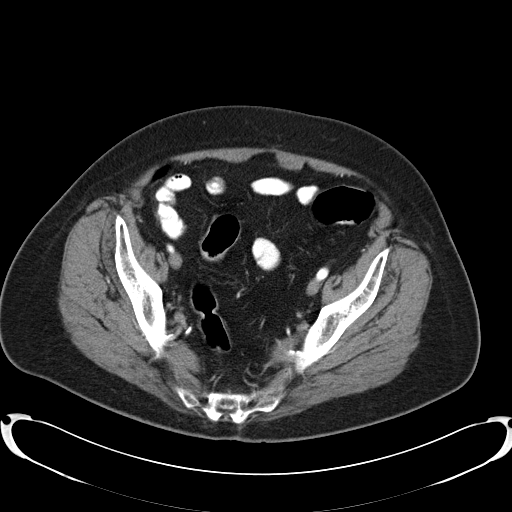
[im 37/132  soft-tissue]
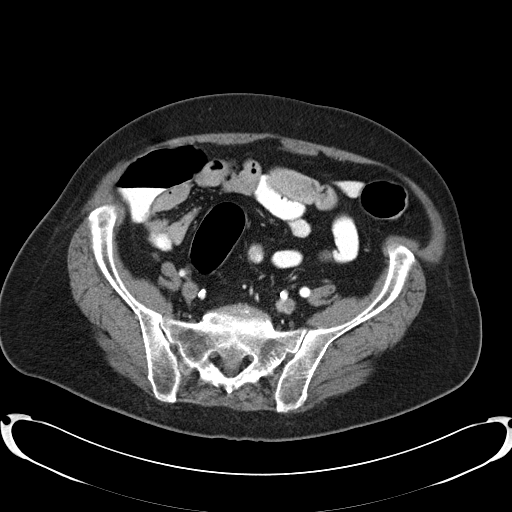
[im 44/132  soft-tissue]
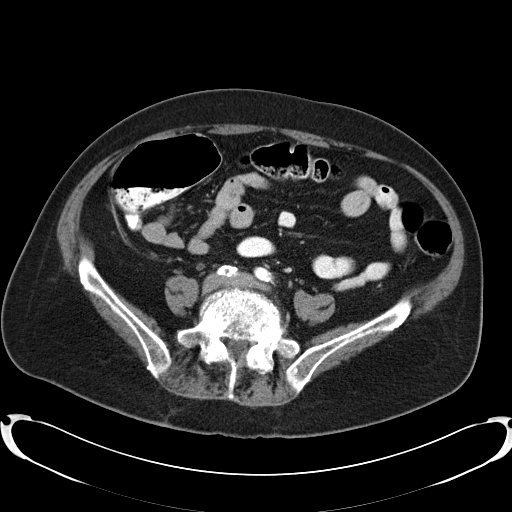
[im 59/132  soft-tissue]
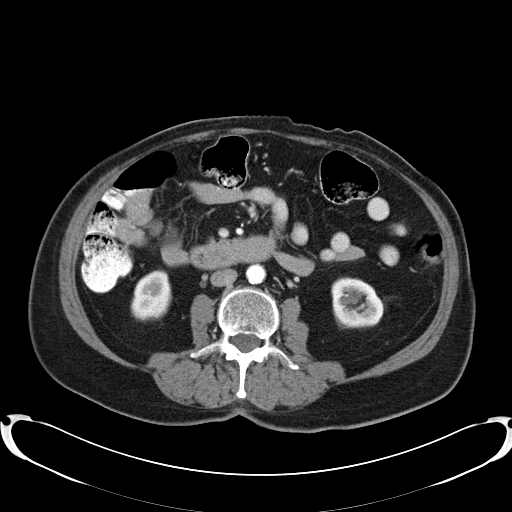
[im 66/132  soft-tissue]
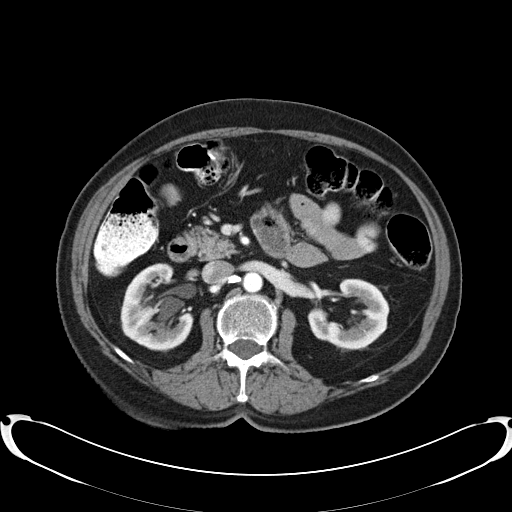
[im 73/132  soft-tissue]
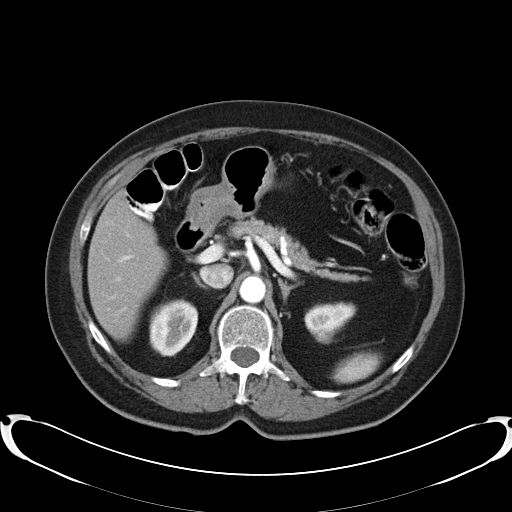
[im 88/132  soft-tissue]
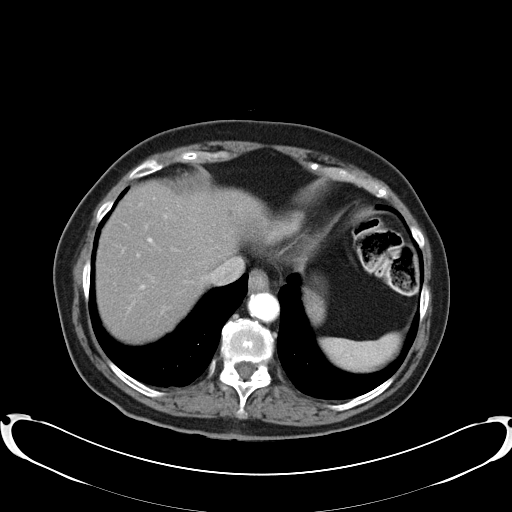
[im 88/132  bone]
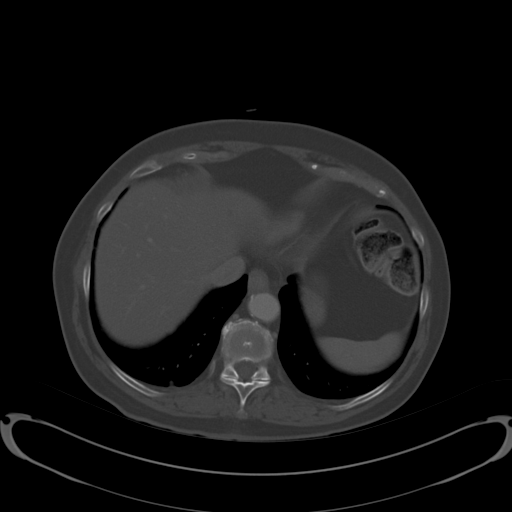
[im 95/132  soft-tissue]
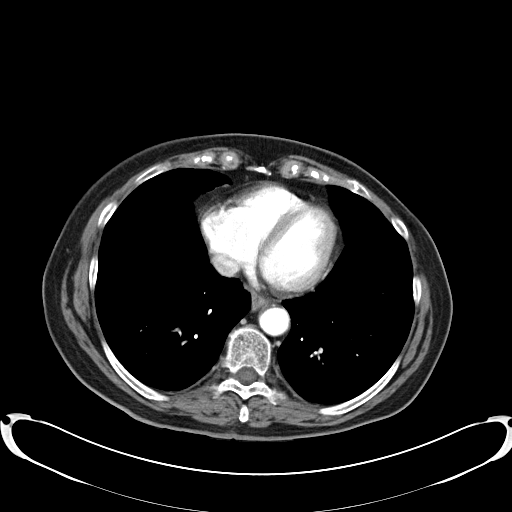
[im 102/132  soft-tissue]
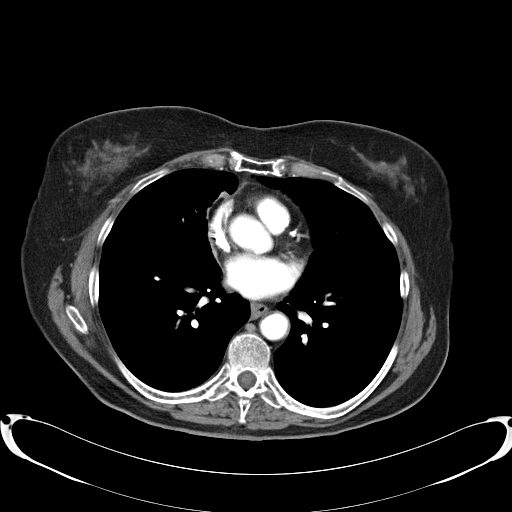
[im 117/132  soft-tissue]
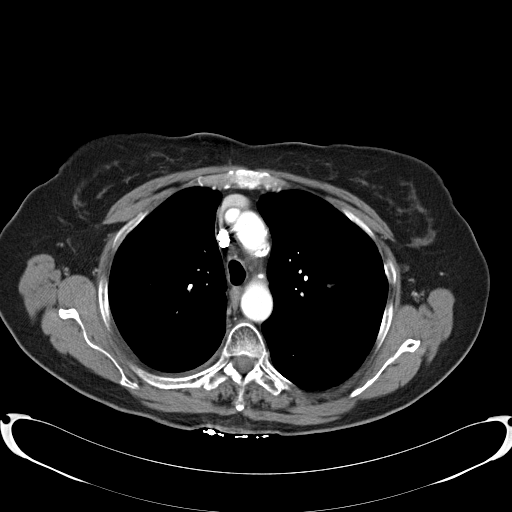
[im 124/132  soft-tissue]
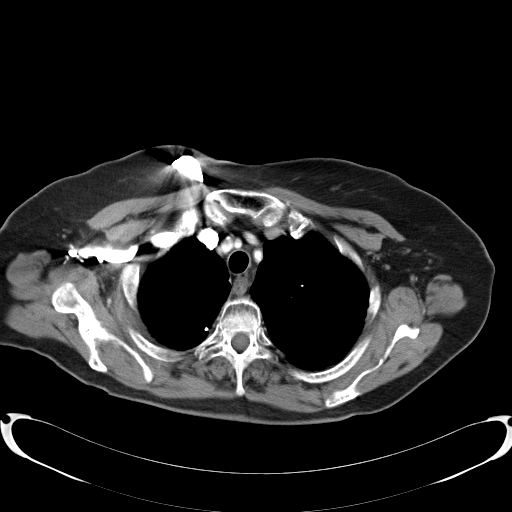

[Series 602: coronal · coronal · 1.29mm/px · 3 of 87 slices shown]
[im 29/87  soft-tissue]
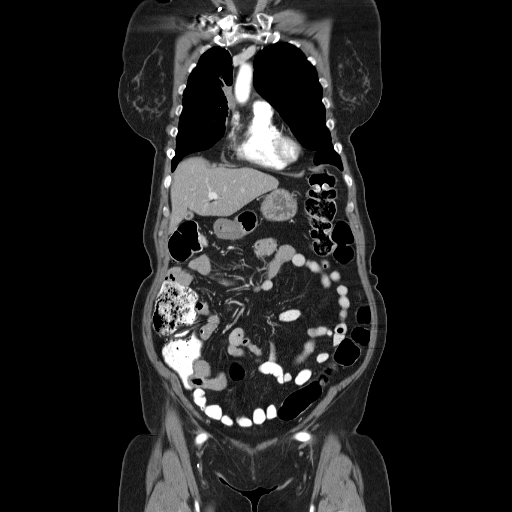
[im 39/87  soft-tissue]
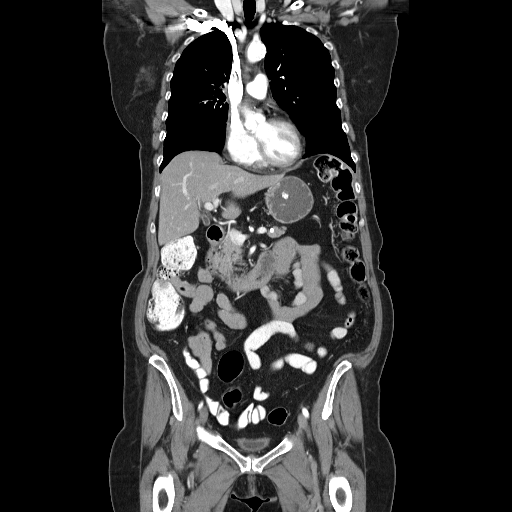
[im 48/87  soft-tissue]
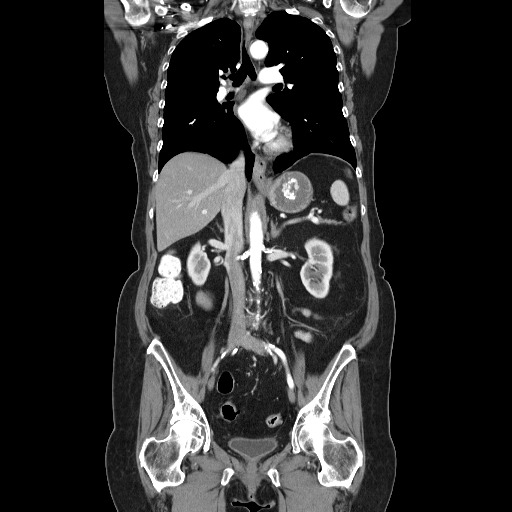

[16 of 46 positions shown; findings below may reference images not displayed]

FINDINGS: There is a port in the right anterior chest wall.  No
axillary or supraclavicular lymphadenopathy.  No mediastinal or
hilar lymphadenopathy.  No pericardial fluid.  Coronary
calcifications are present.  Esophagus is normal.  There is linear
fibrosis in the paramediastinal right upper lobe consistent
radiation injury.  Bilateral small pulmonary nodules are again
demonstrated.  There these are not increased in size compared to
prior.  For example right lower lobe nodule measures 4 mm (image
41) compared to 5 mm on prior.  Pleural based nodule in the right
lower lobe measures 4 mm (image 39) not changed from 5 mm on prior.
4 mm nodule in the medial left lung base (image 36) decreased from
5 mm on prior.  Focal consolidation in the right lower lobe (image
26)  is slightly less dense than prior.  No new pulmonary nodules
are present.
IMPRESSION: 1..  Interval decrease in size of multiple small pulmonary
metastasis.  No new metastatic lesions identified.
2.  Stable focus of consolidation in the right lower lobe.

3.  Stable paramediastinal radiation change in the right upper
lobe.

CT ABDOMEN AND PELVIS
FINDINGS: Small ill-defined hypodense lesion in the superior
aspect of the left lateral hepatic lobe measures 5 mm.  This is
faintly visible on comparison exam of 09/16/2011 .    Gallstones in
the gallbladder.  The pancreas, spleen, adrenal glands, and kidneys
are unchanged.  The stomach, small bowel, and colon are normal.
There are diverticula sigmoid colon with nondistension.

No free fluid the pelvis.  No retroperitoneal or pelvic
lymphadenopathy.  No peritoneal disease.  There is a fem-fem bypass
graft.  Bladder is normal.  Post hysterectomy anatomy.

Round sclerotic lesion in the in the inferior endplate of the T9
vertebral body which measures 9 mm (image 45).  This is more
prominent on the comparison exams of 09/16/2011 not present on the
comparison CT of 01/13/2011.
IMPRESSION: 1..  No evidence of metastasis to the abdomen pelvis.
2.  Small Hypodense lesion in the left hepatic lobe.  Recommend
attention on follow-up.

3..  Enlarging round sclerotic lesion in T9 vertebral body
endplate.  This likely represents an enlarging Schmorl's node.
Recommend attention on follow-up to exclude an unlikely solitary
skeletal metastasis.

## 2012-10-26 ENCOUNTER — Telehealth: Payer: Self-pay | Admitting: Internal Medicine

## 2012-10-26 ENCOUNTER — Other Ambulatory Visit (HOSPITAL_BASED_OUTPATIENT_CLINIC_OR_DEPARTMENT_OTHER): Payer: Medicare Other | Admitting: Lab

## 2012-10-26 ENCOUNTER — Encounter: Payer: Self-pay | Admitting: Internal Medicine

## 2012-10-26 ENCOUNTER — Encounter: Payer: Self-pay | Admitting: Physician Assistant

## 2012-10-26 ENCOUNTER — Ambulatory Visit (HOSPITAL_BASED_OUTPATIENT_CLINIC_OR_DEPARTMENT_OTHER): Payer: Medicare Other | Admitting: Physician Assistant

## 2012-10-26 DIAGNOSIS — R197 Diarrhea, unspecified: Secondary | ICD-10-CM

## 2012-10-26 DIAGNOSIS — C787 Secondary malignant neoplasm of liver and intrahepatic bile duct: Secondary | ICD-10-CM

## 2012-10-26 DIAGNOSIS — R21 Rash and other nonspecific skin eruption: Secondary | ICD-10-CM

## 2012-10-26 DIAGNOSIS — C3410 Malignant neoplasm of upper lobe, unspecified bronchus or lung: Secondary | ICD-10-CM

## 2012-10-26 LAB — CBC WITH DIFFERENTIAL/PLATELET
Basophils Absolute: 0 10*3/uL (ref 0.0–0.1)
Eosinophils Absolute: 0.5 10*3/uL (ref 0.0–0.5)
HCT: 39.8 % (ref 34.8–46.6)
HGB: 12.5 g/dL (ref 11.6–15.9)
MCV: 91.2 fL (ref 79.5–101.0)
MONO%: 9.2 % (ref 0.0–14.0)
NEUT#: 3.5 10*3/uL (ref 1.5–6.5)
RDW: 15.9 % — ABNORMAL HIGH (ref 11.2–14.5)
lymph#: 0.4 10*3/uL — ABNORMAL LOW (ref 0.9–3.3)

## 2012-10-26 LAB — COMPREHENSIVE METABOLIC PANEL (CC13)
Albumin: 3.2 g/dL — ABNORMAL LOW (ref 3.5–5.0)
BUN: 17.3 mg/dL (ref 7.0–26.0)
Calcium: 8.9 mg/dL (ref 8.4–10.4)
Chloride: 111 mEq/L — ABNORMAL HIGH (ref 98–107)
Glucose: 91 mg/dl (ref 70–99)
Potassium: 3.6 mEq/L (ref 3.5–5.1)

## 2012-10-26 MED ORDER — ERLOTINIB HCL 100 MG PO TABS: 100.0000 mg | ORAL_TABLET | Freq: Every day | ORAL | Status: DC

## 2012-10-26 NOTE — Patient Instructions (Addendum)
Complete your current supply of Gilotrif as directed He will begin Tarceva at 100 mg by mouth daily. Take 1 tablet by mouth either one hour before or 2 hours after a meal Followup with Dr. Arbutus Ped in one month for another symptom management visit with a restaging CT scan of the chest, abdomen and pelvis to reevaluate your disease

## 2012-10-26 NOTE — Progress Notes (Signed)
[facility] [phone]   [phone]  OFFICE PROGRESS NOTE  Monique Baars, MD [address] [facility], [place]. [place] [address]  DIAGNOSIS: Recurrent non-small cell lung cancer, adenocarcinoma with positive EGFR mutation in exon 19 and negative ALK gene translocation initially diagnosed as stage IIIA in (T1b N2 M0) in July 2012.   PRIOR THERAPY:  #1 Status post concurrent chemoradiation with weekly carboplatin and paclitaxel, last dose was given 03/01/2011.  #2 status post 3 seconds of consolidation chemotherapy was carboplatin for AUC of 5 and Alimta 500 mg/M2 giving him to see weeks, last dose was given 05/27/2011.  # 3 systemic chemotherapy with carboplatin for AUC of 5, paclitaxel 175 mg/M2 and Avastin 15 mg/kg every 3 weeks. Status post 6 cycles.  #4 maintenance therapy with the Avastin 15 mg/kg every 3 weeks, status post 6 cycles, last dose was given on 06/02/2012 discontinued today secondary to disease progression.  #5 Gilotrif (Afatinib) 40 mg by mouth daily started 06/27/2012. Status post approximately 1 month of therapy.   CURRENT THERAPY:Gilotrif (Afatinib) 30 mg by mouth daily status post approximately 3 months of therapy   INTERVAL HISTORY: [name] 79 y.o. female returns to the clinic today for followup visit accompanied by her husband.  The patient is tolerating her treatment with Gilotrif fairly well except for persistent diarrhea with up to 6 - 7 episodes per day on the "bad days". She did have some improvement in the diarrhea after holding the Gilotrif as instructed for 5 days. She continues to alternate Lomotil and Imodium with reasonable control usually. She reports some splitting and cracking of the skin on her heels. There is some soreness in this area as well. She's been using a cream to soften this area that her daughter gave her.  She complains of having no energy. She has very mild skin rash. She  denied having any significant weight loss or night sweats.   MEDICAL HISTORY: Past Medical History  Diagnosis Date  . Lung mass   . History of radiation therapy 7/31/212-03/08/2011    right upper lobe lung adenocarcinoma  . COPD (chronic obstructive pulmonary disease)   . Hypertension   . Hypercholesterolemia   . Osteoporosis   . History of chemotherapy     carboplatin/paclitaxel  . lung ca July 2012    , right upper lobe  . Liver metastases dx'd 06/16/12    ALLERGIES:  is allergic to aleve and zyban.  MEDICATIONS:  Current Outpatient Prescriptions  Medication Sig Dispense Refill  . cetirizine (ZYRTEC) 5 MG tablet Take 5 mg by mouth daily.      . chlorhexidine (PERIDEX) 0.12 % solution Use as directed 15 mLs in the mouth or throat 2 (two) times daily.  240 mL  2  . clindamycin (CLEOCIN-T) 1 % external solution Apply topically 2 (two) times daily.  60 mL  2  . diphenoxylate-atropine (LOMOTIL) 2.5-0.025 MG per tablet TAKE 2 TABLETS BY MOUTH 4 TIMES A DAY AS NEEDED FOR DIARRHEA OR LOOSE STOOL.  60 tablet  2  . erlotinib (TARCEVA) 100 MG tablet Take 1 tablet (100 mg total) by mouth daily. Take on an empty stomach 1 hour before meals or 2 hours after  30 tablet  2  . GILOTRIF 30 MG tablet TAKE 1 TABLET BY MOUTH DAILY ON AN EMPTY STOMACH 1 HOUR BEFORE OR 2 HOURS AFTER MEALS  30 tablet  0  . lidocaine-prilocaine (EMLA) cream Apply topically  as needed. Use as directed  30 g  1  . loperamide (IMODIUM A-D) 2 MG tablet Take 1 tablet (2 mg total) by mouth 4 (four) times daily as needed for diarrhea or loose stools.  30 tablet  0  . losartan-hydrochlorothiazide (HYZAAR) 50-12.5 MG per tablet Take 1 tablet by mouth daily. Takes 1/2 tablet by mouth daily      . phenazopyridine (PYRIDIUM) 100 MG tablet 1 to 2 tablets by mouth every 8 hours as needed for pain with urination  20 tablet  0  . potassium chloride SA (K-DUR,KLOR-CON) 20 MEQ tablet Take x 10 days.  20 tablet  0  . prochlorperazine  (COMPAZINE) 10 MG tablet Take 1 tablet (10 mg total) by mouth every 6 (six) hours as needed.  30 tablet  1  . traMADol (ULTRAM) 50 MG tablet Take 1 tablet (50 mg total) by mouth every 6 (six) hours as needed. Maximum dose= 8 tablets per day  60 tablet  0   No current facility-administered medications for this visit.    SURGICAL HISTORY:  Past Surgical History  Procedure Laterality Date  . Abdominal hysterectomy      s/p for fibroid tumor  . Portacath placement      right ij     REVIEW OF SYSTEMS:  A comprehensive review of systems was negative except for: Constitutional: positive for fatigue Gastrointestinal: positive for diarrhea Mild skin rash, and cracking/splitting of the skin on her heels bilaterally   PHYSICAL EXAMINATION: General appearance: alert, cooperative and no distress Head: Normocephalic, without obvious abnormality, atraumatic Neck: no adenopathy Lymph nodes: Cervical, supraclavicular, and axillary nodes normal. Resp: clear to auscultation bilaterally Cardio: regular rate and rhythm, S1, S2 normal, no murmur, click, rub or gallop GI: soft, non-tender; bowel sounds normal; no masses,  no organomegaly Extremities: extremities normal, atraumatic, no cyanosis or edema Neurologic: Alert and oriented X 3, normal strength and tone. Normal symmetric reflexes. Normal coordination and gait Skin: The face reveals a grade 1 dry erythematous acneiform eruptions on the cheeks chin and nose without evidence of superinfection. Examination of the skin on the heels reveal hairline linear cracking and splitting with some varying stages of healing. There is minimal erythema, mild tenderness and no evidence of superinfection  ECOG PERFORMANCE STATUS: 1 - Symptomatic but completely ambulatory  Blood pressure 126/81, pulse 90, temperature 97.5 F (36.4 C), temperature source Oral, resp. rate 20, height 5' 4.5" (1.638 m), weight 128 lb 11.2 oz (58.378 kg).  LABORATORY DATA: Lab Results   Component Value Date   WBC 4.9 10/26/2012   HGB 12.5 10/26/2012   HCT 39.8 10/26/2012   MCV 91.2 10/26/2012   PLT 172 10/26/2012      Chemistry      Component Value Date/Time   NA 144 10/26/2012 0947   NA 136 01/25/2012 1137   NA 142 09/16/2011 0917   K 3.6 10/26/2012 0947   K 4.1 01/25/2012 1137   K 3.7 09/16/2011 0917   CL 111* 10/26/2012 0947   CL 109 01/25/2012 1137   CL 106 09/16/2011 0917   CO2 24 10/26/2012 0947   CO2 23 01/25/2012 1137   CO2 29 09/16/2011 0917   BUN 17.3 10/26/2012 0947   BUN 12 01/25/2012 1137   BUN 16 09/16/2011 0917   CREATININE 1.0 10/26/2012 0947   CREATININE 0.62 01/25/2012 1137   CREATININE 0.8 09/16/2011 0917      Component Value Date/Time   CALCIUM 8.9 10/26/2012 0947  CALCIUM 9.3 01/25/2012 1137   CALCIUM 8.3 09/16/2011 0917   ALKPHOS 71 10/26/2012 0947   ALKPHOS 51 01/25/2012 1137   ALKPHOS 62 09/16/2011 0917   AST 15 10/26/2012 0947   AST 13 01/25/2012 1137   AST 18 09/16/2011 0917   ALT 11 10/26/2012 0947   ALT 14 01/25/2012 1137   BILITOT 0.68 10/26/2012 0947   BILITOT 0.5 01/25/2012 1137   BILITOT 0.60 09/16/2011 0917       RADIOGRAPHIC STUDIES: Ct Chest Wo Contrast  08/28/2012  *RADIOLOGY REPORT*  Clinical Data:  Lung cancer with hepatic metastases.  Ongoing chemotherapy.  Radiation therapy completed to September 2012. Weight loss with abdominal pain and diarrhea.  CT CHEST, ABDOMEN AND PELVIS WITHOUT CONTRAST  Technique:  Multidetector CT imaging of the chest, abdomen and pelvis was performed following the standard protocol without IV contrast.  Comparison:  06/16/2012.   CT CHEST  Findings:  No pathologically enlarged mediastinal or axillary lymph nodes.  Hilar regions are difficult to definitively evaluate without IV contrast.  Coronary artery calcification and stenting. Heart size normal.  No pericardial effusion.  Small right pleural effusion, stable.  Evolving radiation fibrosis and volume loss in the medial right hemithorax. Associated nodular components in the medial right  lower lobe are unchanged.  There has been interval improvement in diffuse micro nodularity throughout the lungs, basilar predominant, without complete resolution.  An irregular nodular density in the right lower lobe measures 9 mm (image 44).  Airway is unremarkable.  IMPRESSION:  1.  Interval response to therapy as evidenced by decrease in size and number of widespread miliary pulmonary parenchymal metastases, without complete resolution. 2.  Irregular nodular density in the right lower lobe.  Continued attention on follow-up exams is warranted. 3.  Stable radiation fibrosis and volume loss in the right hemithorax. Associated nodular components in the medial right lower lobe are unchanged. 4.  Stable small right pleural effusion.   CT ABDOMEN AND PELVIS  Findings:  Progressive left hepatic lobe atrophy with areas of low attenuation.  Sludge and stones in the gallbladder. Adrenal glands, kidneys, spleen, pancreas, stomach and bowel are unremarkable.  Atherosclerotic calcification of the arterial vasculature without abdominal aortic aneurysm.  A bifemoral bypass graft is in place. No pathologically enlarged lymph nodes.  No free fluid. Hysterectomy.  A sclerotic lesion in the T10 vertebral body is unchanged.  IMPRESSION:  1.  Progressive left hepatic lobe atrophy, presumably related to metastatic disease.  This area is poorly evaluated in the absence of IV contrast. 2.  Interval resolution of previously seen retroperitoneal abdominal and pelvic adenopathy.   Original Report Authenticated By: Leanna Battles, M.D.     ASSESSMENT/PLAN: This is a very pleasant 79 years old white female with recurrent non-small cell lung cancer, adenocarcinoma with positive EGFR mutation in exon 19 currently on treatment with Gilotrif 30 mg by mouth daily. The patient is tolerating her treatment well except for persistent diarrhea which was worse after the oral contrast for the recent scan. She had significant improvement in her  disease after treatment with Gilotrif. The patient was discussed with Dr. Arbutus Ped. She continues to have significant diarrhea related to the Gilotrif . She also has had some skin changes in the form of rash and almost hand-foot type changes to her heels also related to the Gilotrif. She initially was treated with the Gilotrif at 40 mg by mouth daily and been dose reduced to 30 mg by mouth daily secondary to significant diarrhea. As  this is persistent she will discontinue the Gilotrif after her current supply, which is approximately an additional 8 days. She will then begin treatment with Tarceva at 100 mg by mouth daily. The side effects of Tarceva were reviewed with the patient and she voiced understanding. These include but are not limited to skin rash and diarrhea however hopefully these will be to a significantly lesser extent and will be much more tolerated by the patient. She'll followup with Dr. Arbutus Ped in one month with a repeat CBC differential, C. met and CT of the chest, abdomen and pelvis without contrast to reevaluate her disease. Her chemistry panel results became available and her hypokalemia has resolved.   Olan Kurek E, PA-C   She was advised to call immediately if she has any concerning symptoms in the interval.  All questions were answered. The patient knows to call the clinic with any problems, questions or concerns. We can certainly see the patient much sooner if necessary.  I spent 20 minutes counseling the patient face to face. The total time spent in the appointment was 30 minutes.

## 2012-10-26 NOTE — Progress Notes (Signed)
[facility], [id number] tarceva 100mg  from 10/26/12-10/26/13.

## 2012-10-26 NOTE — Telephone Encounter (Signed)
GV AND PRINTED APPT SCHED AND AVS FOR PT.Monique KitchenPT REQUESTED TO HAVE WATER BASED TO CT...PT AWARE CS WILL CALL WITH D.T. FPR CT.Monique KitchenMarland Gray

## 2012-11-08 ENCOUNTER — Other Ambulatory Visit: Payer: Self-pay | Admitting: Physician Assistant

## 2012-11-15 ENCOUNTER — Other Ambulatory Visit: Payer: Self-pay | Admitting: Physician Assistant

## 2012-11-15 ENCOUNTER — Telehealth: Payer: Self-pay | Admitting: Medical Oncology

## 2012-11-15 NOTE — Telephone Encounter (Signed)
Sent to [facility] for evaluation

## 2012-11-20 ENCOUNTER — Ambulatory Visit (HOSPITAL_COMMUNITY)
Admission: RE | Admit: 2012-11-20 | Discharge: 2012-11-20 | Disposition: A | Discharge status: Home / Self Care | Payer: Medicare Other | Source: Ambulatory Visit | Attending: Physician Assistant | Admitting: Physician Assistant

## 2012-11-20 ENCOUNTER — Telehealth: Payer: Self-pay | Admitting: *Deleted

## 2012-11-20 DIAGNOSIS — J90 Pleural effusion, not elsewhere classified: Secondary | ICD-10-CM | POA: Insufficient documentation

## 2012-11-20 DIAGNOSIS — C787 Secondary malignant neoplasm of liver and intrahepatic bile duct: Secondary | ICD-10-CM | POA: Insufficient documentation

## 2012-11-20 DIAGNOSIS — K8020 Calculus of gallbladder without cholecystitis without obstruction: Secondary | ICD-10-CM | POA: Insufficient documentation

## 2012-11-20 DIAGNOSIS — C3490 Malignant neoplasm of unspecified part of unspecified bronchus or lung: Secondary | ICD-10-CM | POA: Insufficient documentation

## 2012-11-20 MED ORDER — IOHEXOL 300 MG/ML  SOLN
50.0000 mL | Freq: Once | INTRAMUSCULAR | Status: AC | PRN
  Administered 2012-11-20: 50 mL via ORAL

## 2012-11-20 NOTE — Telephone Encounter (Signed)
Pt called wanting to know whether it is okay to get a routine teeth cleaning while in Tarceva.  Per AJ, okay to get teeth cleaned, informed pt, she verbalized understanding. SLJ

## 2012-11-22 ENCOUNTER — Other Ambulatory Visit (HOSPITAL_BASED_OUTPATIENT_CLINIC_OR_DEPARTMENT_OTHER): Payer: Medicare Other | Admitting: Lab

## 2012-11-22 ENCOUNTER — Telehealth: Payer: Self-pay | Admitting: Internal Medicine

## 2012-11-22 ENCOUNTER — Ambulatory Visit (HOSPITAL_BASED_OUTPATIENT_CLINIC_OR_DEPARTMENT_OTHER): Payer: Medicare Other

## 2012-11-22 ENCOUNTER — Ambulatory Visit (HOSPITAL_BASED_OUTPATIENT_CLINIC_OR_DEPARTMENT_OTHER): Payer: Medicare Other | Admitting: Internal Medicine

## 2012-11-22 ENCOUNTER — Encounter: Payer: Self-pay | Admitting: Internal Medicine

## 2012-11-22 DIAGNOSIS — J90 Pleural effusion, not elsewhere classified: Secondary | ICD-10-CM

## 2012-11-22 DIAGNOSIS — Z452 Encounter for adjustment and management of vascular access device: Secondary | ICD-10-CM

## 2012-11-22 DIAGNOSIS — C3410 Malignant neoplasm of upper lobe, unspecified bronchus or lung: Secondary | ICD-10-CM

## 2012-11-22 LAB — COMPREHENSIVE METABOLIC PANEL (CC13)
AST: 17 U/L (ref 5–34)
Alkaline Phosphatase: 96 U/L (ref 40–150)
BUN: 17 mg/dL (ref 7.0–26.0)
Creatinine: 0.9 mg/dL (ref 0.6–1.1)

## 2012-11-22 LAB — CBC WITH DIFFERENTIAL/PLATELET
Basophils Absolute: 0 10*3/uL (ref 0.0–0.1)
EOS%: 10.5 % — ABNORMAL HIGH (ref 0.0–7.0)
HCT: 40.3 % (ref 34.8–46.6)
HGB: 13.3 g/dL (ref 11.6–15.9)
LYMPH%: 5.4 % — ABNORMAL LOW (ref 14.0–49.7)
MCH: 30.3 pg (ref 25.1–34.0)
MCV: 91.5 fL (ref 79.5–101.0)
MONO%: 6.8 % (ref 0.0–14.0)
NEUT%: 76.6 % (ref 38.4–76.8)
RDW: 15.4 % — ABNORMAL HIGH (ref 11.2–14.5)

## 2012-11-22 MED ORDER — SODIUM CHLORIDE 0.9 % IJ SOLN
10.0000 mL | INTRAMUSCULAR | Status: DC | PRN
  Administered 2012-11-22: 10 mL via INTRAVENOUS
  Filled 2012-11-22: qty 10

## 2012-11-22 MED ORDER — HEPARIN SOD (PORK) LOCK FLUSH 100 UNIT/ML IV SOLN
500.0000 [IU] | Freq: Once | INTRAVENOUS | Status: AC
  Administered 2012-11-22: 500 [IU] via INTRAVENOUS
  Filled 2012-11-22: qty 5

## 2012-11-22 NOTE — Progress Notes (Signed)
[facility] Telephone:(336) [phone]   Fax:(336) [phone]  OFFICE PROGRESS NOTE  [name], MD [address] [facility], [place]. [place] [address]  DIAGNOSIS: Recurrent non-small cell lung cancer, adenocarcinoma with positive EGFR mutation in exon 19 and negative ALK gene translocation initially diagnosed as stage IIIA in (T1b N2 M0) in July 2012.   PRIOR THERAPY:  #1 Status post concurrent chemoradiation with weekly carboplatin and paclitaxel, last dose was given 03/01/2011.  #2 status post 3 seconds of consolidation chemotherapy was carboplatin for AUC of 5 and Alimta 500 mg/M2 giving him to see weeks, last dose was given 05/27/2011.  # 3 systemic chemotherapy with carboplatin for AUC of 5, paclitaxel 175 mg/M2 and Avastin 15 mg/kg every 3 weeks. Status post 6 cycles.  #4 maintenance therapy with the Avastin 15 mg/kg every 3 weeks, status post 6 cycles, last dose was given on 06/02/2012 discontinued today secondary to disease progression.  #5 Gilotrif (Afatinib) 40 mg by mouth daily started 06/27/2012. Status post approximately 1 month of therapy.  #6 Gilotrif (Afatinib) 30 mg by mouth daily status post approximately 3 months of therapy discontinued secondary to persistent diarrhea.   CURRENT THERAPY: Tarceva 100 mg by mouth daily status post 1 month.   INTERVAL HISTORY: [name] 79 y.o. female returns to the clinic today for follow up visit accompanied by her husband and 2 daughters. The patient is tolerating her current treatment with Tarceva 100 mg by mouth daily fairly well with no significant adverse effects except for occasional diarrhea and no significant skin rash. She denied having any significant chest pain, shortness breath, cough or hemoptysis. The patient denied having any nausea or vomiting. She has no fever or chills. She had repeat CT scan of the chest, abdomen and pelvis performed recently and she is here for evaluation and  discussion of her scan results.   MEDICAL HISTORY: Past Medical History  Diagnosis Date  . Lung mass   . History of radiation therapy 7/31/212-03/08/2011    right upper lobe lung adenocarcinoma  . COPD (chronic obstructive pulmonary disease)   . Hypertension   . Hypercholesterolemia   . Osteoporosis   . History of chemotherapy     carboplatin/paclitaxel  . lung ca July 2012    , right upper lobe  . Liver metastases dx'd 06/16/12    ALLERGIES:  is allergic to aleve and zyban.  MEDICATIONS:  Current Outpatient Prescriptions  Medication Sig Dispense Refill  . chlorhexidine (PERIDEX) 0.12 % solution Use as directed 15 mLs in the mouth or throat 2 (two) times daily.  240 mL  2  . diphenoxylate-atropine (LOMOTIL) 2.5-0.025 MG per tablet TAKE 2 TABLETS BY MOUTH 4 TIMES A DAY AS NEEDED FOR DIARRHEA OR LOOSE STOOL.  60 tablet  2  . erlotinib (TARCEVA) 100 MG tablet Take 1 tablet (100 mg total) by mouth daily. Take on an empty stomach 1 hour before meals or 2 hours after  30 tablet  2  . lidocaine-prilocaine (EMLA) cream Apply topically as needed. Use as directed  30 g  1  . loperamide (IMODIUM A-D) 2 MG tablet Take 1 tablet (2 mg total) by mouth 4 (four) times daily as needed for diarrhea or loose stools.  30 tablet  0  . losartan-hydrochlorothiazide (HYZAAR) 50-12.5 MG per tablet Take 1 tablet by mouth daily. Takes 1/2 tablet by mouth daily      . prochlorperazine (COMPAZINE) 10 MG tablet Take 1 tablet (  10 mg total) by mouth every 6 (six) hours as needed.  30 tablet  1  . traMADol (ULTRAM) 50 MG tablet Take 1 tablet (50 mg total) by mouth every 6 (six) hours as needed. Maximum dose= 8 tablets per day  60 tablet  0  . clindamycin (CLEOCIN-T) 1 % external solution Apply topically 2 (two) times daily.  60 mL  2   No current facility-administered medications for this visit.    SURGICAL HISTORY:  Past Surgical History  Procedure Laterality Date  . Abdominal hysterectomy      s/p for  fibroid tumor  . Portacath placement      right ij     REVIEW OF SYSTEMS:  A comprehensive review of systems was negative except for: Gastrointestinal: positive for diarrhea   PHYSICAL EXAMINATION: General appearance: alert, cooperative and no distress Head: Normocephalic, without obvious abnormality, atraumatic Neck: no adenopathy Lymph nodes: Cervical, supraclavicular, and axillary nodes normal. Resp: clear to auscultation bilaterally Cardio: regular rate and rhythm, S1, S2 normal, no murmur, click, rub or gallop GI: soft, non-tender; bowel sounds normal; no masses,  no organomegaly Extremities: extremities normal, atraumatic, no cyanosis or edema Neurologic: Alert and oriented X 3, normal strength and tone. Normal symmetric reflexes. Normal coordination and gait  ECOG PERFORMANCE STATUS: 1 - Symptomatic but completely ambulatory  Blood pressure 138/54, pulse 73, temperature 97.7 F (36.5 C), temperature source Oral, resp. rate 18, height 5' 4.5" (1.638 m), weight 130 lb 1.6 oz (59.013 kg), SpO2 98.00%.  LABORATORY DATA: Lab Results  Component Value Date   WBC 6.3 11/22/2012   HGB 13.3 11/22/2012   HCT 40.3 11/22/2012   MCV 91.5 11/22/2012   PLT 172 11/22/2012      Chemistry      Component Value Date/Time   NA 144 10/26/2012 0947   NA 136 01/25/2012 1137   NA 142 09/16/2011 0917   K 3.6 10/26/2012 0947   K 4.1 01/25/2012 1137   K 3.7 09/16/2011 0917   CL 111* 10/26/2012 0947   CL 109 01/25/2012 1137   CL 106 09/16/2011 0917   CO2 24 10/26/2012 0947   CO2 23 01/25/2012 1137   CO2 29 09/16/2011 0917   BUN 17.3 10/26/2012 0947   BUN 12 01/25/2012 1137   BUN 16 09/16/2011 0917   CREATININE 1.0 10/26/2012 0947   CREATININE 0.62 01/25/2012 1137   CREATININE 0.8 09/16/2011 0917      Component Value Date/Time   CALCIUM 8.9 10/26/2012 0947   CALCIUM 9.3 01/25/2012 1137   CALCIUM 8.3 09/16/2011 0917   ALKPHOS 71 10/26/2012 0947   ALKPHOS 51 01/25/2012 1137   ALKPHOS 62 09/16/2011 0917   AST 15 10/26/2012 0947    AST 13 01/25/2012 1137   AST 18 09/16/2011 0917   ALT 11 10/26/2012 0947   ALT 14 01/25/2012 1137   BILITOT 0.68 10/26/2012 0947   BILITOT 0.5 01/25/2012 1137   BILITOT 0.60 09/16/2011 0917       RADIOGRAPHIC STUDIES: Ct Chest Wo Contrast  11/20/2012   *RADIOLOGY REPORT*  Clinical Data:  Lung cancer restaging.  CT CHEST, ABDOMEN AND PELVIS WITHOUT CONTRAST  Technique:  Multidetector CT imaging of the chest, abdomen and pelvis was performed following the standard protocol without IV contrast.  Comparison:  08/28/2012.   CT CHEST  Findings:  No pathologically enlarged mediastinal or axillary lymph nodes.  Hilar regions are difficult to definitively evaluate without IV contrast.  Heart size normal.  Coronary artery calcification.  No pericardial effusion.  Tiny hiatal hernia.  Small right pleural effusion, increased from prior.  Emphysema. Mild architectural distortion, bronchiectasis, consolidation and volume loss are seen in the medial right hemithorax, as before, with minimal peripheral involvement of the right upper lobe. A 7-8 mm ground-glass nodular density right lower lobe (image 42) may correspond to a 9 mm lesion seen in the same location on 08/28/2012. Scattered tiny nodularity in the lungs bilaterally, as before.  Airway is otherwise unremarkable.  IMPRESSION:  1.  Small right pleural effusion, increased in size from 08/28/2012. 2.  Presumed radiation change and volume loss in the medial right hemithorax. 3.  Scattered nodularity in the lungs is unchanged from 08/28/2012, without evidence of recurrent miliary metastases, as on 06/16/2012.   CT ABDOMEN AND PELVIS  Findings:  Left hepatic lobe appears somewhat shrunken.  Liver is otherwise grossly unremarkable small stones layer in the gallbladder.  Adrenal glands are unremarkable.  There may be punctate stone in the lower pole right kidney.  Kidneys, spleen, pancreas, stomach and bowel are otherwise unremarkable.  Extensive atherosclerotic calcification of  the arterial vasculature with a bifemoral bypass graft in place.  No pathologically enlarged lymph nodes.  No free fluid.  No worrisome lytic or sclerotic lesions.  Degenerative changes are seen in the spine.  IMPRESSION:  1.  No evidence of metastatic disease in the abdomen or pelvis. 2.  Cholelithiasis. 3.  Question punctate stone in the right kidney.   Original Report Authenticated By: Leanna Battles, M.D.    ASSESSMENT: this is a very pleasant 79 years old white female with recurrent non-small cell lung cancer, adenocarcinoma with positive EGFR mutation in exon 19. currently on systemic treatment with oral Tarceva 100 mg by mouth daily and tolerating it fairly well. The patient has no evidence for disease progression on his recent scan.   PLAN: She is currently on systemic treatment with oral Tarceva 100 mg by mouth daily and tolerating it fairly well. The patient has no evidence for disease progression on his recent scan. I discussed the scan results with the patient and her family. I recommended for her to continue treatment with Tarceva with the same dose. She was advised to take Imodium after every episode of diarrhea. She would come back for follow up visit in one month for reevaluation and management any adverse effect of her treatment. The patient was advised to call immediately if she has any concerning symptoms in the interval.  All questions were answered. The patient knows to call the clinic with any problems, questions or concerns. We can certainly see the patient much sooner if necessary.

## 2012-11-22 NOTE — Patient Instructions (Signed)
Call MD for problems or concerns 

## 2012-11-22 NOTE — Telephone Encounter (Signed)
gv adn printed appt sched and avs to pt. °

## 2012-11-22 NOTE — Patient Instructions (Signed)
Your scan showed no evidence for disease progression. Continue treatment with Tarceva Follow up visit in one month

## 2012-12-19 ENCOUNTER — Telehealth: Payer: Self-pay | Admitting: Internal Medicine

## 2012-12-19 ENCOUNTER — Ambulatory Visit (HOSPITAL_BASED_OUTPATIENT_CLINIC_OR_DEPARTMENT_OTHER): Payer: Medicare Other | Admitting: Internal Medicine

## 2012-12-19 ENCOUNTER — Ambulatory Visit: Payer: Medicare Other

## 2012-12-19 ENCOUNTER — Other Ambulatory Visit (HOSPITAL_BASED_OUTPATIENT_CLINIC_OR_DEPARTMENT_OTHER): Payer: Medicare Other | Admitting: Lab

## 2012-12-19 ENCOUNTER — Encounter: Payer: Self-pay | Admitting: Internal Medicine

## 2012-12-19 DIAGNOSIS — C3490 Malignant neoplasm of unspecified part of unspecified bronchus or lung: Secondary | ICD-10-CM

## 2012-12-19 DIAGNOSIS — C3491 Malignant neoplasm of unspecified part of right bronchus or lung: Secondary | ICD-10-CM

## 2012-12-19 LAB — COMPREHENSIVE METABOLIC PANEL (CC13)
AST: 18 U/L (ref 5–34)
BUN: 13.8 mg/dL (ref 7.0–26.0)
Calcium: 9.4 mg/dL (ref 8.4–10.4)
Chloride: 107 mEq/L (ref 98–109)
Creatinine: 0.8 mg/dL (ref 0.6–1.1)
Glucose: 101 mg/dl (ref 70–140)

## 2012-12-19 LAB — CBC WITH DIFFERENTIAL/PLATELET
Basophils Absolute: 0 10*3/uL (ref 0.0–0.1)
EOS%: 9.5 % — ABNORMAL HIGH (ref 0.0–7.0)
HCT: 40.2 % (ref 34.8–46.6)
HGB: 13.5 g/dL (ref 11.6–15.9)
MCH: 30.5 pg (ref 25.1–34.0)
MCV: 90.7 fL (ref 79.5–101.0)
MONO%: 7.6 % (ref 0.0–14.0)
NEUT%: 75.5 % (ref 38.4–76.8)
lymph#: 0.4 10*3/uL — ABNORMAL LOW (ref 0.9–3.3)

## 2012-12-19 MED ORDER — SODIUM CHLORIDE 0.9 % IJ SOLN
10.0000 mL | INTRAMUSCULAR | Status: DC | PRN
  Administered 2012-12-19: 10 mL via INTRAVENOUS
  Filled 2012-12-19: qty 10

## 2012-12-19 MED ORDER — HEPARIN SOD (PORK) LOCK FLUSH 100 UNIT/ML IV SOLN
500.0000 [IU] | Freq: Once | INTRAVENOUS | Status: AC
  Administered 2012-12-19: 500 [IU] via INTRAVENOUS
  Filled 2012-12-19: qty 5

## 2012-12-19 NOTE — Progress Notes (Signed)
[facility] Telephone:(336) [phone]   Fax:(336) [phone]  OFFICE PROGRESS NOTE  [name], MD [address] [facility], [place]. [place] [address]  DIAGNOSIS: Recurrent non-small cell lung cancer, adenocarcinoma with positive EGFR mutation in exon 19 and negative ALK gene translocation initially diagnosed as stage IIIA in (T1b N2 M0) in July 2012.   PRIOR THERAPY:  #1 Status post concurrent chemoradiation with weekly carboplatin and paclitaxel, last dose was given 03/01/2011.  #2 status post 3 seconds of consolidation chemotherapy was carboplatin for AUC of 5 and Alimta 500 mg/M2 giving him to see weeks, last dose was given 05/27/2011.  # 3 systemic chemotherapy with carboplatin for AUC of 5, paclitaxel 175 mg/M2 and Avastin 15 mg/kg every 3 weeks. Status post 6 cycles.  #4 maintenance therapy with the Avastin 15 mg/kg every 3 weeks, status post 6 cycles, last dose was given on 06/02/2012 discontinued today secondary to disease progression.  #5 Gilotrif (Afatinib) 40 mg by mouth daily started 06/27/2012. Status post approximately 1 month of therapy.  #6 Gilotrif (Afatinib) 30 mg by mouth daily status post approximately 3 months of therapy discontinued secondary to persistent diarrhea.   CURRENT THERAPY: Tarceva 100 mg by mouth daily status post 2 months.    INTERVAL HISTORY: [name] 79 y.o. female returns to the clinic today for followup visit accompanied by her husband. The patient is feeling fine today with no specific complaints. She is tolerating her treatment with Tarceva fairly well with no significant adverse effects. She denied having any skin rash but only few episodes of diarrhea every now and then. She denied having any significant weight loss or night sweats. The patient denied having any chest pain but continues to have shortness breath with exertion no cough or hemoptysis.  MEDICAL HISTORY: Past Medical History  Diagnosis  Date  . Lung mass   . History of radiation therapy 7/31/212-03/08/2011    right upper lobe lung adenocarcinoma  . COPD (chronic obstructive pulmonary disease)   . Hypertension   . Hypercholesterolemia   . Osteoporosis   . History of chemotherapy     carboplatin/paclitaxel  . lung ca July 2012    , right upper lobe  . Liver metastases dx'd 06/16/12    ALLERGIES:  is allergic to aleve and zyban.  MEDICATIONS:  Current Outpatient Prescriptions  Medication Sig Dispense Refill  . chlorhexidine (PERIDEX) 0.12 % solution Use as directed 15 mLs in the mouth or throat 2 (two) times daily.  240 mL  2  . clindamycin (CLEOCIN-T) 1 % external solution Apply topically 2 (two) times daily.  60 mL  2  . erlotinib (TARCEVA) 100 MG tablet Take 1 tablet (100 mg total) by mouth daily. Take on an empty stomach 1 hour before meals or 2 hours after  30 tablet  2  . lidocaine-prilocaine (EMLA) cream Apply topically as needed. Use as directed  30 g  1  . prochlorperazine (COMPAZINE) 10 MG tablet Take 1 tablet (10 mg total) by mouth every 6 (six) hours as needed.  30 tablet  1  . traMADol (ULTRAM) 50 MG tablet Take 1 tablet (50 mg total) by mouth every 6 (six) hours as needed. Maximum dose= 8 tablets per day  60 tablet  0  . losartan-hydrochlorothiazide (HYZAAR) 50-12.5 MG per tablet Take 1 tablet by mouth daily. Takes 1/2 tablet by mouth daily       No current facility-administered medications for this visit.  SURGICAL HISTORY:  Past Surgical History  Procedure Laterality Date  . Abdominal hysterectomy      s/p for fibroid tumor  . Portacath placement      right ij     REVIEW OF SYSTEMS:  A comprehensive review of systems was negative except for: Constitutional: positive for fatigue Respiratory: positive for dyspnea on exertion   PHYSICAL EXAMINATION: General appearance: alert, cooperative and no distress Head: Normocephalic, without obvious abnormality, atraumatic Neck: no adenopathy Lymph  nodes: Cervical, supraclavicular, and axillary nodes normal. Resp: clear to auscultation bilaterally Cardio: regular rate and rhythm, S1, S2 normal, no murmur, click, rub or gallop GI: soft, non-tender; bowel sounds normal; no masses,  no organomegaly Extremities: extremities normal, atraumatic, no cyanosis or edema  ECOG PERFORMANCE STATUS: 1 - Symptomatic but completely ambulatory  Blood pressure 142/74, pulse 83, temperature 98.6 F (37 C), temperature source Oral, resp. rate 18, height 5' 4.5" (1.638 m), weight 134 lb 1.6 oz (60.827 kg), SpO2 99.00%.  LABORATORY DATA: Lab Results  Component Value Date   WBC 5.4 12/19/2012   HGB 13.5 12/19/2012   HCT 40.2 12/19/2012   MCV 90.7 12/19/2012   PLT 155 12/19/2012      Chemistry      Component Value Date/Time   NA 142 11/22/2012 1104   NA 136 01/25/2012 1137   NA 142 09/16/2011 0917   K 3.8 11/22/2012 1104   K 4.1 01/25/2012 1137   K 3.7 09/16/2011 0917   CL 106 11/22/2012 1104   CL 109 01/25/2012 1137   CL 106 09/16/2011 0917   CO2 27 11/22/2012 1104   CO2 23 01/25/2012 1137   CO2 29 09/16/2011 0917   BUN 17.0 11/22/2012 1104   BUN 12 01/25/2012 1137   BUN 16 09/16/2011 0917   CREATININE 0.9 11/22/2012 1104   CREATININE 0.62 01/25/2012 1137   CREATININE 0.8 09/16/2011 0917      Component Value Date/Time   CALCIUM 9.2 11/22/2012 1104   CALCIUM 9.3 01/25/2012 1137   CALCIUM 8.3 09/16/2011 0917   ALKPHOS 96 11/22/2012 1104   ALKPHOS 51 01/25/2012 1137   ALKPHOS 62 09/16/2011 0917   AST 17 11/22/2012 1104   AST 13 01/25/2012 1137   AST 18 09/16/2011 0917   ALT 16 11/22/2012 1104   ALT 14 01/25/2012 1137   BILITOT 1.45* 11/22/2012 1104   BILITOT 0.5 01/25/2012 1137   BILITOT 0.60 09/16/2011 0917       ASSESSMENT AND PLAN: This very pleasant 79 years old white female with recurrent non-small cell lung cancer with positive EGFR mutation in exon 19 currently undergoing systemic chemotherapy with Tarceva 100 mg by mouth daily status post 2 months of treatment and tolerating it  fairly well. I recommended for the patient to continue her current treatment as scheduled.  I would see her back for followup visit in one month with repeat CBC and comprehensive metabolic panel. She was advised to call immediately if she has any concerning symptoms in the interval he  All questions were answered. The patient knows to call the clinic with any problems, questions or concerns. We can certainly see the patient much sooner if necessary.

## 2012-12-19 NOTE — Patient Instructions (Signed)
Continue treatment with Tarceva Followup visit in one month. 

## 2012-12-19 NOTE — Telephone Encounter (Signed)
Gave pt appt for lab and MD for July 2014, pt wantrs portacath flush today, notified toni RN @ flush room

## 2012-12-19 NOTE — Patient Instructions (Signed)
Call MD for problems or cocnerns

## 2013-01-01 ENCOUNTER — Ambulatory Visit: Payer: Medicare Other | Admitting: Neurosurgery

## 2013-01-01 ENCOUNTER — Encounter (INDEPENDENT_AMBULATORY_CARE_PROVIDER_SITE_OTHER): Payer: Medicare Other | Admitting: *Deleted

## 2013-01-01 DIAGNOSIS — I70219 Atherosclerosis of native arteries of extremities with intermittent claudication, unspecified extremity: Secondary | ICD-10-CM

## 2013-01-01 DIAGNOSIS — I739 Peripheral vascular disease, unspecified: Secondary | ICD-10-CM

## 2013-01-03 ENCOUNTER — Other Ambulatory Visit: Payer: Self-pay | Admitting: *Deleted

## 2013-01-03 DIAGNOSIS — I739 Peripheral vascular disease, unspecified: Secondary | ICD-10-CM

## 2013-01-03 DIAGNOSIS — Z48812 Encounter for surgical aftercare following surgery on the circulatory system: Secondary | ICD-10-CM

## 2013-01-10 ENCOUNTER — Encounter: Payer: Self-pay | Admitting: Surgery

## 2013-01-18 ENCOUNTER — Other Ambulatory Visit (HOSPITAL_BASED_OUTPATIENT_CLINIC_OR_DEPARTMENT_OTHER): Payer: Medicare Other | Admitting: Lab

## 2013-01-18 ENCOUNTER — Telehealth: Payer: Self-pay | Admitting: Internal Medicine

## 2013-01-18 ENCOUNTER — Encounter: Payer: Self-pay | Admitting: Internal Medicine

## 2013-01-18 ENCOUNTER — Ambulatory Visit (HOSPITAL_BASED_OUTPATIENT_CLINIC_OR_DEPARTMENT_OTHER): Payer: Medicare Other | Admitting: Internal Medicine

## 2013-01-18 ENCOUNTER — Ambulatory Visit (HOSPITAL_BASED_OUTPATIENT_CLINIC_OR_DEPARTMENT_OTHER): Payer: Medicare Other

## 2013-01-18 DIAGNOSIS — C3490 Malignant neoplasm of unspecified part of unspecified bronchus or lung: Secondary | ICD-10-CM

## 2013-01-18 LAB — CBC WITH DIFFERENTIAL/PLATELET
Basophils Absolute: 0 10*3/uL (ref 0.0–0.1)
EOS%: 8.3 % — ABNORMAL HIGH (ref 0.0–7.0)
Eosinophils Absolute: 0.5 10*3/uL (ref 0.0–0.5)
HCT: 40.5 % (ref 34.8–46.6)
HGB: 13.4 g/dL (ref 11.6–15.9)
MCH: 30.4 pg (ref 25.1–34.0)
MONO#: 0.4 10*3/uL (ref 0.1–0.9)
NEUT#: 4.2 10*3/uL (ref 1.5–6.5)
NEUT%: 76.6 % (ref 38.4–76.8)
lymph#: 0.4 10*3/uL — ABNORMAL LOW (ref 0.9–3.3)

## 2013-01-18 LAB — COMPREHENSIVE METABOLIC PANEL (CC13)
Albumin: 3.3 g/dL — ABNORMAL LOW (ref 3.5–5.0)
BUN: 16.6 mg/dL (ref 7.0–26.0)
CO2: 25 mEq/L (ref 22–29)
Calcium: 9.5 mg/dL (ref 8.4–10.4)
Chloride: 107 mEq/L (ref 98–109)
Creatinine: 0.9 mg/dL (ref 0.6–1.1)
Glucose: 83 mg/dl (ref 70–140)
Potassium: 4 mEq/L (ref 3.5–5.1)

## 2013-01-18 MED ORDER — SODIUM CHLORIDE 0.9 % IJ SOLN
10.0000 mL | INTRAMUSCULAR | Status: DC | PRN
  Administered 2013-01-18: 10 mL via INTRAVENOUS
  Filled 2013-01-18: qty 10

## 2013-01-18 MED ORDER — HEPARIN SOD (PORK) LOCK FLUSH 100 UNIT/ML IV SOLN
500.0000 [IU] | Freq: Once | INTRAVENOUS | Status: AC
  Administered 2013-01-18: 500 [IU] via INTRAVENOUS
  Filled 2013-01-18: qty 5

## 2013-01-18 NOTE — Progress Notes (Signed)
[facility] Telephone:(336) [phone]   Fax:(336) [phone]  OFFICE PROGRESS NOTE  [name], MD [address] [facility], [place]. [place] [address]  DIAGNOSIS: Recurrent non-small cell lung cancer, adenocarcinoma with positive EGFR mutation in exon 19 and negative ALK gene translocation initially diagnosed as stage IIIA in (T1b N2 M0) in July 2012.   PRIOR THERAPY:  #1 Status post concurrent chemoradiation with weekly carboplatin and paclitaxel, last dose was given 03/01/2011.  #2 status post 3 seconds of consolidation chemotherapy was carboplatin for AUC of 5 and Alimta 500 mg/M2 giving him to see weeks, last dose was given 05/27/2011.  # 3 systemic chemotherapy with carboplatin for AUC of 5, paclitaxel 175 mg/M2 and Avastin 15 mg/kg every 3 weeks. Status post 6 cycles.  #4 maintenance therapy with the Avastin 15 mg/kg every 3 weeks, status post 6 cycles, last dose was given on 06/02/2012 discontinued today secondary to disease progression.  #5 Gilotrif (Afatinib) 40 mg by mouth daily started 06/27/2012. Status post approximately 1 month of therapy.  #6 Gilotrif (Afatinib) 30 mg by mouth daily status post approximately 3 months of therapy discontinued secondary to persistent diarrhea.   CURRENT THERAPY: Tarceva 100 mg by mouth daily status post 3 months.  CHEMOTHERAPY INTENT: Palliative  CURRENT # OF CHEMOTHERAPY CYCLES: 4  CURRENT ANTIEMETICS: Compazine  CURRENT SMOKING STATUS: Nonsmoker  ORAL CHEMOTHERAPY AND CONSENT: Afatinib followed by Tarceva. Consent obtained 06/22/2012.  CURRENT BISPHOSPHONATES USE: None  PAIN MANAGEMENT: No Pain  NARCOTICS INDUCED CONSTIPATION: N/A  LIVING WILL AND CODE STATUS:  No CODE BLUE.    INTERVAL HISTORY: [name] 79 y.o. female returns to the clinic today for follow up visit accompanied her daughter Babette Relic. The patient is feeling fine today with no specific complaints. She is tolerating her  treatment with Tarceva fairly well with no significant adverse effects except for very mild skin rash and infrequent episodes of diarrhea. The patient denied having any significant chest pain, shortness breath, cough or hemoptysis. She denied having any fatigue weakness. She has no nausea or vomiting.  MEDICAL HISTORY: Past Medical History  Diagnosis Date  . Lung mass   . History of radiation therapy 7/31/212-03/08/2011    right upper lobe lung adenocarcinoma  . COPD (chronic obstructive pulmonary disease)   . Hypertension   . Hypercholesterolemia   . Osteoporosis   . History of chemotherapy     carboplatin/paclitaxel  . lung ca July 2012    , right upper lobe  . Liver metastases dx'd 06/16/12    ALLERGIES:  is allergic to aleve and zyban.  MEDICATIONS:  Current Outpatient Prescriptions  Medication Sig Dispense Refill  . clindamycin (CLEOCIN-T) 1 % external solution Apply topically 2 (two) times daily.  60 mL  2  . erlotinib (TARCEVA) 100 MG tablet Take 1 tablet (100 mg total) by mouth daily. Take on an empty stomach 1 hour before meals or 2 hours after  30 tablet  2  . lidocaine-prilocaine (EMLA) cream Apply topically as needed. Use as directed  30 g  1  . losartan-hydrochlorothiazide (HYZAAR) 50-12.5 MG per tablet Take 1 tablet by mouth daily. Takes 1/2 tablet by mouth daily      . traMADol (ULTRAM) 50 MG tablet Take 1 tablet (50 mg total) by mouth every 6 (six) hours as needed. Maximum dose= 8 tablets per day  60 tablet  0  . chlorhexidine (PERIDEX) 0.12 % solution Use as directed 15 mLs in  the mouth or throat 2 (two) times daily.  240 mL  2  . prochlorperazine (COMPAZINE) 10 MG tablet Take 1 tablet (10 mg total) by mouth every 6 (six) hours as needed.  30 tablet  1   No current facility-administered medications for this visit.    SURGICAL HISTORY:  Past Surgical History  Procedure Laterality Date  . Abdominal hysterectomy      s/p for fibroid tumor  . Portacath placement        right ij     REVIEW OF SYSTEMS:  A comprehensive review of systems was negative.   PHYSICAL EXAMINATION: General appearance: alert, cooperative and no distress Head: Normocephalic, without obvious abnormality, atraumatic Neck: no adenopathy Lymph nodes: Cervical, supraclavicular, and axillary nodes normal. Resp: clear to auscultation bilaterally Cardio: regular rate and rhythm, S1, S2 normal, no murmur, click, rub or gallop GI: soft, non-tender; bowel sounds normal; no masses,  no organomegaly Extremities: extremities normal, atraumatic, no cyanosis or edema  ECOG PERFORMANCE STATUS: 1 - Symptomatic but completely ambulatory  Blood pressure 140/64, pulse 79, temperature 97.8 F (36.6 C), temperature source Oral, resp. rate 18, height 5' 4.5" (1.638 m), weight 135 lb 9.6 oz (61.508 kg).  LABORATORY DATA: Lab Results  Component Value Date   WBC 5.5 01/18/2013   HGB 13.4 01/18/2013   HCT 40.5 01/18/2013   MCV 91.8 01/18/2013   PLT 178 01/18/2013      Chemistry      Component Value Date/Time   NA 141 12/19/2012 1119   NA 136 01/25/2012 1137   NA 142 09/16/2011 0917   K 4.2 12/19/2012 1119   K 4.1 01/25/2012 1137   K 3.7 09/16/2011 0917   CL 106 11/22/2012 1104   CL 109 01/25/2012 1137   CL 106 09/16/2011 0917   CO2 28 12/19/2012 1119   CO2 23 01/25/2012 1137   CO2 29 09/16/2011 0917   BUN 13.8 12/19/2012 1119   BUN 12 01/25/2012 1137   BUN 16 09/16/2011 0917   CREATININE 0.8 12/19/2012 1119   CREATININE 0.62 01/25/2012 1137   CREATININE 0.8 09/16/2011 0917      Component Value Date/Time   CALCIUM 9.4 12/19/2012 1119   CALCIUM 9.3 01/25/2012 1137   CALCIUM 8.3 09/16/2011 0917   ALKPHOS 87 12/19/2012 1119   ALKPHOS 51 01/25/2012 1137   ALKPHOS 62 09/16/2011 0917   AST 18 12/19/2012 1119   AST 13 01/25/2012 1137   AST 18 09/16/2011 0917   ALT 14 12/19/2012 1119   ALT 14 01/25/2012 1137   ALT 18 09/16/2011 0917   BILITOT 1.00 12/19/2012 1119   BILITOT 0.5 01/25/2012 1137   BILITOT 0.60 09/16/2011 0917        RADIOGRAPHIC STUDIES: No results found.  ASSESSMENT AND PLAN: this is a very pleasant 79 years old white female with metastatic non-small cell lung cancer, adenocarcinoma with positive EGFR mutation currently on treatment with Tarceva 100 mg by mouth daily and tolerating it fairly well with no significant adverse effects. I recommended for the patient to continue treatment was Tarceva with the current dose. I would see her back for follow up visit in one month with repeat CT scan of the chest, abdomen and pelvis for restaging of her disease. The patient was advised to call immediately if she has any concerning symptoms in the interval.   The patient voices understanding of current disease status and treatment options and is in agreement with the current care plan.  All questions  were answered. The patient knows to call the clinic with any problems, questions or concerns. We can certainly see the patient much sooner if necessary.

## 2013-01-18 NOTE — Telephone Encounter (Signed)
Gave pt appt for lab and Md August 2014

## 2013-01-18 NOTE — Patient Instructions (Addendum)
Implanted Port Instructions  An implanted port is a central line that has a round shape and is placed under the skin. It is used for long-term IV (intravenous) access for:  · Medicine.  · Fluids.  · Liquid nutrition, such as TPN (total parenteral nutrition).  · Blood samples.  Ports can be placed:  · In the chest area just below the collarbone (this is the most common place.)  · In the arms.  · In the belly (abdomen) area.  · In the legs.  PARTS OF THE PORT  A port has 2 main parts:  · The reservoir. The reservoir is round, disc-shaped, and will be a small, raised area under your skin.  · The reservoir is the part where a needle is inserted (accessed) to either give medicines or to draw blood.  · The catheter. The catheter is a long, slender tube that extends from the reservoir. The catheter is placed into a large vein.  · Medicine that is inserted into the reservoir goes into the catheter and then into the vein.  INSERTION OF THE PORT  · The port is surgically placed in either an operating room or in a procedural area (interventional radiology).  · Medicine may be given to help you relax during the procedure.  · The skin where the port will be inserted is numbed (local anesthetic).  · 1 or 2 small cuts (incisions) will be made in the skin to insert the port.  · The port can be used after it has been inserted.  INCISION SITE CARE  · The incision site may have small adhesive strips on it. This helps keep the incision site closed. Sometimes, no adhesive strips are placed. Instead of adhesive strips, a special kind of surgical glue is used to keep the incision closed.  · If adhesive strips were placed on the incision sites, do not take them off. They will fall off on their own.  · The incision site may be sore for 1 to 2 days. Pain medicine can help.  · Do not get the incision site wet. Bathe or shower as directed by your caregiver.  · The incision site should heal in 5 to 7 days. A small scar may form after the  incision has healed.  ACCESSING THE PORT  Special steps must be taken to access the port:  · Before the port is accessed, a numbing cream can be placed on the skin. This helps numb the skin over the port site.  · A sterile technique is used to access the port.  · The port is accessed with a needle. Only "non-coring" port needles should be used to access the port. Once the port is accessed, a blood return should be checked. This helps ensure the port is in the vein and is not clogged (clotted).  · If your caregiver believes your port should remain accessed, a clear (transparent) bandage will be placed over the needle site. The bandage and needle will need to be changed every week or as directed by your caregiver.  · Keep the bandage covering the needle clean and dry. Do not get it wet. Follow your caregiver's instructions on how to take a shower or bath when the port is accessed.  · If your port does not need to stay accessed, no bandage is needed over the port.  FLUSHING THE PORT  Flushing the port keeps it from getting clogged. How often the port is flushed depends on:  · If a   constant infusion is running. If a constant infusion is running, the port may not need to be flushed.  · If intermittent medicines are given.  · If the port is not being used.  For intermittent medicines:  · The port will need to be flushed:  · After medicines have been given.  · After blood has been drawn.  · As part of routine maintenance.  · A port is normally flushed with:  · Normal saline.  · Heparin.  · Follow your caregiver's advice on how often, how much, and the type of flush to use on your port.  IMPORTANT PORT INFORMATION  · Tell your caregiver if you are allergic to heparin.  · After your port is placed, you will get a manufacturer's information card. The card has information about your port. Keep this card with you at all times.  · There are many types of ports available. Know what kind of port you have.  · In case of an  emergency, it may be helpful to wear a medical alert bracelet. This can help alert health care workers that you have a port.  · The port can stay in for as long as your caregiver believes it is necessary.  · When it is time for the port to come out, surgery will be done to remove it. The surgery will be similar to how the port was put in.  · If you are in the hospital or clinic:  · Your port will be taken care of and flushed by a nurse.  · If you are at home:  · A home health care nurse may give medicines and take care of the port.  · You or a family member can get special training and directions for giving medicine and taking care of the port at home.  SEEK IMMEDIATE MEDICAL CARE IF:   · Your port does not flush or you are unable to get a blood return.  · New drainage or pus is coming from the incision.  · A bad smell is coming from the incision site.  · You develop swelling or increased redness at the incision site.  · You develop increased swelling or pain at the port site.  · You develop swelling or pain in the surrounding skin near the port.  · You have an oral temperature above 102° F (38.9° C), not controlled by medicine.  MAKE SURE YOU:   · Understand these instructions.  · Will watch your condition.  · Will get help right away if you are not doing well or get worse.  Document Released: 06/07/2005 Document Revised: 08/30/2011 Document Reviewed: 08/29/2008  [employer]® Patient Information ©2014 [employer].

## 2013-01-20 NOTE — Patient Instructions (Signed)
CURRENT THERAPY: Tarceva 100 mg by mouth daily status post 3 months.  CHEMOTHERAPY INTENT: Palliative  CURRENT # OF CHEMOTHERAPY CYCLES: 4  CURRENT ANTIEMETICS: Compazine  CURRENT SMOKING STATUS: Nonsmoker  ORAL CHEMOTHERAPY AND CONSENT: Afatinib followed by Tarceva. Consent obtained 06/22/2012.  CURRENT BISPHOSPHONATES USE: None  PAIN MANAGEMENT: No Pain  NARCOTICS INDUCED CONSTIPATION: N/A  LIVING WILL AND CODE STATUS:  No CODE BLUE.

## 2013-02-01 ENCOUNTER — Other Ambulatory Visit: Payer: Self-pay | Admitting: Physician Assistant

## 2013-02-01 DIAGNOSIS — C3490 Malignant neoplasm of unspecified part of unspecified bronchus or lung: Secondary | ICD-10-CM

## 2013-02-13 ENCOUNTER — Other Ambulatory Visit: Payer: Self-pay | Admitting: *Deleted

## 2013-02-14 ENCOUNTER — Telehealth: Payer: Self-pay | Admitting: Internal Medicine

## 2013-02-14 NOTE — Telephone Encounter (Signed)
s.w. pt and advised on Aug and sept appts...pt ok and awre

## 2013-02-15 ENCOUNTER — Other Ambulatory Visit (HOSPITAL_BASED_OUTPATIENT_CLINIC_OR_DEPARTMENT_OTHER): Payer: Medicare Other | Admitting: Lab

## 2013-02-15 ENCOUNTER — Encounter (HOSPITAL_COMMUNITY): Payer: Self-pay

## 2013-02-15 ENCOUNTER — Ambulatory Visit (HOSPITAL_COMMUNITY)
Admission: RE | Admit: 2013-02-15 | Discharge: 2013-02-15 | Disposition: A | Discharge status: Home / Self Care | Payer: Medicare Other | Source: Ambulatory Visit | Attending: Internal Medicine | Admitting: Internal Medicine

## 2013-02-15 DIAGNOSIS — Z79899 Other long term (current) drug therapy: Secondary | ICD-10-CM | POA: Insufficient documentation

## 2013-02-15 DIAGNOSIS — K8020 Calculus of gallbladder without cholecystitis without obstruction: Secondary | ICD-10-CM | POA: Insufficient documentation

## 2013-02-15 DIAGNOSIS — Z923 Personal history of irradiation: Secondary | ICD-10-CM | POA: Insufficient documentation

## 2013-02-15 DIAGNOSIS — I2510 Atherosclerotic heart disease of native coronary artery without angina pectoris: Secondary | ICD-10-CM | POA: Insufficient documentation

## 2013-02-15 DIAGNOSIS — C3490 Malignant neoplasm of unspecified part of unspecified bronchus or lung: Secondary | ICD-10-CM

## 2013-02-15 DIAGNOSIS — J438 Other emphysema: Secondary | ICD-10-CM | POA: Insufficient documentation

## 2013-02-15 DIAGNOSIS — I700 Atherosclerosis of aorta: Secondary | ICD-10-CM | POA: Insufficient documentation

## 2013-02-15 DIAGNOSIS — N200 Calculus of kidney: Secondary | ICD-10-CM | POA: Insufficient documentation

## 2013-02-15 DIAGNOSIS — M47817 Spondylosis without myelopathy or radiculopathy, lumbosacral region: Secondary | ICD-10-CM | POA: Insufficient documentation

## 2013-02-15 DIAGNOSIS — J90 Pleural effusion, not elsewhere classified: Secondary | ICD-10-CM | POA: Insufficient documentation

## 2013-02-15 DIAGNOSIS — N281 Cyst of kidney, acquired: Secondary | ICD-10-CM | POA: Insufficient documentation

## 2013-02-15 LAB — CBC WITH DIFFERENTIAL/PLATELET
BASO%: 0.6 % (ref 0.0–2.0)
Basophils Absolute: 0 10*3/uL (ref 0.0–0.1)
EOS%: 5.5 % (ref 0.0–7.0)
HCT: 42.8 % (ref 34.8–46.6)
HGB: 13.6 g/dL (ref 11.6–15.9)
MONO#: 0.4 10*3/uL (ref 0.1–0.9)
NEUT%: 77.1 % — ABNORMAL HIGH (ref 38.4–76.8)
RDW: 14.3 % (ref 11.2–14.5)
WBC: 5.3 10*3/uL (ref 3.9–10.3)
lymph#: 0.4 10*3/uL — ABNORMAL LOW (ref 0.9–3.3)

## 2013-02-15 LAB — COMPREHENSIVE METABOLIC PANEL (CC13)
ALT: 17 U/L (ref 0–55)
AST: 22 U/L (ref 5–34)
Albumin: 3.6 g/dL (ref 3.5–5.0)
BUN: 15.9 mg/dL (ref 7.0–26.0)
CO2: 24 mEq/L (ref 22–29)
Calcium: 9.4 mg/dL (ref 8.4–10.4)
Chloride: 104 mEq/L (ref 98–109)
Creatinine: 0.8 mg/dL (ref 0.6–1.1)
Potassium: 3.8 mEq/L (ref 3.5–5.1)

## 2013-02-15 MED ORDER — IOHEXOL 300 MG/ML  SOLN
50.0000 mL | Freq: Once | INTRAMUSCULAR | Status: AC | PRN
  Administered 2013-02-15: 50 mL via ORAL

## 2013-02-15 MED ORDER — IOHEXOL 300 MG/ML  SOLN
100.0000 mL | Freq: Once | INTRAMUSCULAR | Status: AC | PRN
  Administered 2013-02-15: 100 mL via INTRAVENOUS

## 2013-02-20 ENCOUNTER — Telehealth: Payer: Self-pay | Admitting: Internal Medicine

## 2013-02-20 ENCOUNTER — Ambulatory Visit (HOSPITAL_BASED_OUTPATIENT_CLINIC_OR_DEPARTMENT_OTHER): Payer: Medicare Other

## 2013-02-20 ENCOUNTER — Encounter: Payer: Self-pay | Admitting: Internal Medicine

## 2013-02-20 ENCOUNTER — Ambulatory Visit (HOSPITAL_BASED_OUTPATIENT_CLINIC_OR_DEPARTMENT_OTHER): Payer: Medicare Other | Admitting: Internal Medicine

## 2013-02-20 DIAGNOSIS — C3410 Malignant neoplasm of upper lobe, unspecified bronchus or lung: Secondary | ICD-10-CM

## 2013-02-20 DIAGNOSIS — Z452 Encounter for adjustment and management of vascular access device: Secondary | ICD-10-CM

## 2013-02-20 MED ORDER — SODIUM CHLORIDE 0.9 % IJ SOLN
10.0000 mL | Freq: Once | INTRAMUSCULAR | Status: AC
  Administered 2013-02-20: 10 mL
  Filled 2013-02-20: qty 10

## 2013-02-20 MED ORDER — HEPARIN SOD (PORK) LOCK FLUSH 100 UNIT/ML IV SOLN
500.0000 [IU] | Freq: Once | INTRAVENOUS | Status: AC
  Administered 2013-02-20: 500 [IU] via INTRAVENOUS
  Filled 2013-02-20: qty 5

## 2013-02-20 NOTE — Patient Instructions (Addendum)
Implanted Port Instructions  An implanted port is a central line that has a round shape and is placed under the skin. It is used for long-term IV (intravenous) access for:  · Medicine.  · Fluids.  · Liquid nutrition, such as TPN (total parenteral nutrition).  · Blood samples.  Ports can be placed:  · In the chest area just below the collarbone (this is the most common place.)  · In the arms.  · In the belly (abdomen) area.  · In the legs.  PARTS OF THE PORT  A port has 2 main parts:  · The reservoir. The reservoir is round, disc-shaped, and will be a small, raised area under your skin.  · The reservoir is the part where a needle is inserted (accessed) to either give medicines or to draw blood.  · The catheter. The catheter is a long, slender tube that extends from the reservoir. The catheter is placed into a large vein.  · Medicine that is inserted into the reservoir goes into the catheter and then into the vein.  INSERTION OF THE PORT  · The port is surgically placed in either an operating room or in a procedural area (interventional radiology).  · Medicine may be given to help you relax during the procedure.  · The skin where the port will be inserted is numbed (local anesthetic).  · 1 or 2 small cuts (incisions) will be made in the skin to insert the port.  · The port can be used after it has been inserted.  INCISION SITE CARE  · The incision site may have small adhesive strips on it. This helps keep the incision site closed. Sometimes, no adhesive strips are placed. Instead of adhesive strips, a special kind of surgical glue is used to keep the incision closed.  · If adhesive strips were placed on the incision sites, do not take them off. They will fall off on their own.  · The incision site may be sore for 1 to 2 days. Pain medicine can help.  · Do not get the incision site wet. Bathe or shower as directed by your caregiver.  · The incision site should heal in 5 to 7 days. A small scar may form after the  incision has healed.  ACCESSING THE PORT  Special steps must be taken to access the port:  · Before the port is accessed, a numbing cream can be placed on the skin. This helps numb the skin over the port site.  · A sterile technique is used to access the port.  · The port is accessed with a needle. Only "non-coring" port needles should be used to access the port. Once the port is accessed, a blood return should be checked. This helps ensure the port is in the vein and is not clogged (clotted).  · If your caregiver believes your port should remain accessed, a clear (transparent) bandage will be placed over the needle site. The bandage and needle will need to be changed every week or as directed by your caregiver.  · Keep the bandage covering the needle clean and dry. Do not get it wet. Follow your caregiver's instructions on how to take a shower or bath when the port is accessed.  · If your port does not need to stay accessed, no bandage is needed over the port.  FLUSHING THE PORT  Flushing the port keeps it from getting clogged. How often the port is flushed depends on:  · If a   constant infusion is running. If a constant infusion is running, the port may not need to be flushed.  · If intermittent medicines are given.  · If the port is not being used.  For intermittent medicines:  · The port will need to be flushed:  · After medicines have been given.  · After blood has been drawn.  · As part of routine maintenance.  · A port is normally flushed with:  · Normal saline.  · Heparin.  · Follow your caregiver's advice on how often, how much, and the type of flush to use on your port.  IMPORTANT PORT INFORMATION  · Tell your caregiver if you are allergic to heparin.  · After your port is placed, you will get a manufacturer's information card. The card has information about your port. Keep this card with you at all times.  · There are many types of ports available. Know what kind of port you have.  · In case of an  emergency, it may be helpful to wear a medical alert bracelet. This can help alert health care workers that you have a port.  · The port can stay in for as long as your caregiver believes it is necessary.  · When it is time for the port to come out, surgery will be done to remove it. The surgery will be similar to how the port was put in.  · If you are in the hospital or clinic:  · Your port will be taken care of and flushed by a nurse.  · If you are at home:  · A home health care nurse may give medicines and take care of the port.  · You or a family member can get special training and directions for giving medicine and taking care of the port at home.  SEEK IMMEDIATE MEDICAL CARE IF:   · Your port does not flush or you are unable to get a blood return.  · New drainage or pus is coming from the incision.  · A bad smell is coming from the incision site.  · You develop swelling or increased redness at the incision site.  · You develop increased swelling or pain at the port site.  · You develop swelling or pain in the surrounding skin near the port.  · You have an oral temperature above 102° F (38.9° C), not controlled by medicine.  MAKE SURE YOU:   · Understand these instructions.  · Will watch your condition.  · Will get help right away if you are not doing well or get worse.  Document Released: 06/07/2005 Document Revised: 08/30/2011 Document Reviewed: 08/29/2008  [employer]® Patient Information ©2014 [employer].

## 2013-02-20 NOTE — Patient Instructions (Addendum)
  CURRENT THERAPY: Tarceva 100 mg by mouth daily status post 4 months.   CHEMOTHERAPY INTENT: Palliative  CURRENT # OF CHEMOTHERAPY CYCLES: 5  CURRENT ANTIEMETICS: Compazine  CURRENT SMOKING STATUS: Nonsmoker  ORAL CHEMOTHERAPY AND CONSENT: Afatinib followed by Tarceva. Consent obtained 06/22/2012.  CURRENT BISPHOSPHONATES USE: None  PAIN MANAGEMENT: No Pain  NARCOTICS INDUCED CONSTIPATION: N/A  LIVING WILL AND CODE STATUS: No CODE BLUE.

## 2013-02-20 NOTE — Progress Notes (Signed)
[facility] [phone]   Fax:([phone]  OFFICE PROGRESS NOTE  [name], MD [address] [facility], [place]. [place] [address]  DIAGNOSIS: Recurrent non-small cell lung cancer, adenocarcinoma with positive EGFR mutation in exon 19 and negative ALK gene translocation initially diagnosed as stage IIIA in (T1b N2 M0) in July 2012.   PRIOR THERAPY:  #1 Status post concurrent chemoradiation with weekly carboplatin and paclitaxel, last dose was given 03/01/2011.  #2 status post 3 seconds of consolidation chemotherapy was carboplatin for AUC of 5 and Alimta 500 mg/M2 giving him to see weeks, last dose was given 05/27/2011.  # 3 systemic chemotherapy with carboplatin for AUC of 5, paclitaxel 175 mg/M2 and Avastin 15 mg/kg every 3 weeks. Status post 6 cycles.  #4 maintenance therapy with the Avastin 15 mg/kg every 3 weeks, status post 6 cycles, last dose was given on 06/02/2012 discontinued today secondary to disease progression.  #5 Gilotrif (Afatinib) 40 mg by mouth daily started 06/27/2012. Status post approximately 1 month of therapy.  #6 Gilotrif (Afatinib) 30 mg by mouth daily status post approximately 3 months of therapy discontinued secondary to persistent diarrhea.   CURRENT THERAPY: Tarceva 100 mg by mouth daily status post 4 months.   CHEMOTHERAPY INTENT: Palliative  CURRENT # OF CHEMOTHERAPY CYCLES: 5   CURRENT ANTIEMETICS: Compazine  CURRENT SMOKING STATUS: Nonsmoker  ORAL CHEMOTHERAPY AND CONSENT: Afatinib followed by Tarceva. Consent obtained 06/22/2012.  CURRENT BISPHOSPHONATES USE: None  PAIN MANAGEMENT: No Pain  NARCOTICS INDUCED CONSTIPATION: N/A  LIVING WILL AND CODE STATUS: No CODE BLUE.  INTERVAL HISTORY: [name] 79 y.o. female returns to the clinic today for followup visit accompanied by her husband and 2 daughters. The patient is feeling fine with no specific complaints today. She denied having any  significant chest pain, shortness of breath, cough or hemoptysis. The patient denied having any nausea or vomiting. She has no fever or chills. She is tolerating her treatment with Tarceva fairly well with no significant skin rash that few infrequent episodes of diarrhea. She had repeat CT scan of the chest, abdomen and pelvis performed recently and she is here for evaluation and discussion of her scan results.  MEDICAL HISTORY: Past Medical History  Diagnosis Date  . Lung mass   . History of radiation therapy 7/31/212-03/08/2011    right upper lobe lung adenocarcinoma  . COPD (chronic obstructive pulmonary disease)   . Hypertension   . Hypercholesterolemia   . Osteoporosis   . History of chemotherapy     carboplatin/paclitaxel  . lung ca July 2012    , right upper lobe  . Liver metastases dx'd 06/16/12    ALLERGIES:  is allergic to aleve and zyban.  MEDICATIONS:  Current Outpatient Prescriptions  Medication Sig Dispense Refill  . chlorhexidine (PERIDEX) 0.12 % solution Use as directed 15 mLs in the mouth or throat 2 (two) times daily.  240 mL  2  . lidocaine-prilocaine (EMLA) cream Apply topically as needed. Use as directed  30 g  1  . TARCEVA 100 MG tablet TAKE 1 TABLET BY MOUTH DAILY. TAKE ON AN EMPTY STOMACH 1 HOUR BEFORE MEALS OR 2 HOURS AFTER  30 tablet  2  . prochlorperazine (COMPAZINE) 10 MG tablet Take 1 tablet (10 mg total) by mouth every 6 (six) hours as needed.  30 tablet  1  . traMADol (ULTRAM) 50 MG tablet Take 1 tablet (50 mg total) by mouth every 6 (  six) hours as needed. Maximum dose= 8 tablets per day  60 tablet  0   No current facility-administered medications for this visit.    SURGICAL HISTORY:  Past Surgical History  Procedure Laterality Date  . Abdominal hysterectomy      s/p for fibroid tumor  . Portacath placement      right ij     REVIEW OF SYSTEMS:  A comprehensive review of systems was negative except for: Gastrointestinal: positive for diarrhea    PHYSICAL EXAMINATION: General appearance: alert, cooperative and no distress Head: Normocephalic, without obvious abnormality, atraumatic Neck: no adenopathy Lymph nodes: Cervical, supraclavicular, and axillary nodes normal. Resp: clear to auscultation bilaterally Cardio: regular rate and rhythm, S1, S2 normal, no murmur, click, rub or gallop GI: soft, non-tender; bowel sounds normal; no masses,  no organomegaly Extremities: extremities normal, atraumatic, no cyanosis or edema Neurologic: Alert and oriented X 3, normal strength and tone. Normal symmetric reflexes. Normal coordination and gait  ECOG PERFORMANCE STATUS: 1 - Symptomatic but completely ambulatory  Blood pressure 119/74, pulse 100, temperature 97 F (36.1 C), temperature source Oral, resp. rate 18, height 5' 4.5" (1.638 m), weight 135 lb 12.8 oz (61.598 kg), SpO2 97.00%.  LABORATORY DATA: Lab Results  Component Value Date   WBC 5.3 02/15/2013   HGB 13.6 02/15/2013   HCT 42.8 02/15/2013   MCV 92.8 02/15/2013   PLT 171 02/15/2013      Chemistry      Component Value Date/Time   NA 138 02/15/2013 1155   NA 136 01/25/2012 1137   NA 142 09/16/2011 0917   K 3.8 02/15/2013 1155   K 4.1 01/25/2012 1137   K 3.7 09/16/2011 0917   CL 106 11/22/2012 1104   CL 109 01/25/2012 1137   CL 106 09/16/2011 0917   CO2 24 02/15/2013 1155   CO2 23 01/25/2012 1137   CO2 29 09/16/2011 0917   BUN 15.9 02/15/2013 1155   BUN 12 01/25/2012 1137   BUN 16 09/16/2011 0917   CREATININE 0.8 02/15/2013 1155   CREATININE 0.62 01/25/2012 1137   CREATININE 0.8 09/16/2011 0917      Component Value Date/Time   CALCIUM 9.4 02/15/2013 1155   CALCIUM 9.3 01/25/2012 1137   CALCIUM 8.3 09/16/2011 0917   ALKPHOS 87 02/15/2013 1155   ALKPHOS 51 01/25/2012 1137   ALKPHOS 62 09/16/2011 0917   AST 22 02/15/2013 1155   AST 13 01/25/2012 1137   AST 18 09/16/2011 0917   ALT 17 02/15/2013 1155   ALT 14 01/25/2012 1137   ALT 18 09/16/2011 0917   BILITOT 1.59* 02/15/2013 1155   BILITOT 0.5  01/25/2012 1137   BILITOT 0.60 09/16/2011 0917       RADIOGRAPHIC STUDIES: Ct Chest W Contrast  02/15/2013   CLINICAL DATA:  Lung cancer diagnosed in July 2012 with radiation therapy completed. Chemotherapy ongoing.  EXAM: CT CHEST, ABDOMEN, AND PELVIS WITH CONTRAST  TECHNIQUE: Multidetector CT imaging of the chest, abdomen and pelvis was performed following the standard protocol during bolus administration of intravenous contrast.  CONTRAST:  50mL OMNIPAQUE IOHEXOL 300 MG/ML SOLN, OMNIPAQUE IOHEXOL 300 MG/ML SOLN  COMPARISON:  Prior examinations 11/20/2012 and 08/28/2012.  FINDINGS:   CT CHEST FINDINGS  Right IJ Port-A-Cath tip is at the SVC right atrial junction. An 8 mm low-density right thyroid lesion on image number 1 was not previously imaged. There are no enlarged mediastinal, hilar or axillary lymph nodes. There is distortion of the right hilum with  perihilar scarring attributed to prior radiation therapy.  No new or enlarging pulmonary nodules are identified. Small subpleural nodules and mild emphysema are again noted.  A dependent right pleural effusion has enlarged, now moderate in size. This demonstrates no definite soft tissue components. There is no pericardial or left pleural effusion. There is stable atherosclerosis of the aorta, great vessels and coronary arteries.  No worrisome osseous findings are demonstrated.   CT ABDOMEN AND PELVIS FINDINGS  There is stable atrophy of the left hepatic lobe without evidence of central mass or biliary dilatation. The portal and hepatic veins are patent. Small calcified gallstones are noted. There is no gallbladder wall thickening or biliary dilatation. The spleen, pancreas and adrenal glands appear normal.  There is a stable tiny nonobstructing calculus in the lower pole of the right kidney. Small cortical and parapelvic cysts are present within both kidneys. There is no hydronephrosis.  There is stable diffuse atherosclerosis of the aorta, iliac  arteries and proximal visceral arteries. Femoral femoral graft is patent. A 7 mm left periaortic node on image 71 is minimally more prominent. No pathologically enlarged abdominal pelvic lymph nodes are seen. There is no ascites or peritoneal nodularity. Wall thickening of the distal stomach on the portal phase images is less evident on the delayed images, likely due to contraction. No significant abnormalities of the small bowel or colon are seen. Sigmoid colon diverticular changes are noted.  There is no evidence of pelvic mass status post hysterectomy. The bladder has a stable appearance. There are no worrisome osseous findings. There is spondylosis with ankylosis across the L4-5 disc. Scattered small sclerotic lesions are stable.  IMPRESSION: CT CHEST IMPRESSION  1. Further enlargement of right pleural effusion without pleural-based nodularity. This may be related to prior radiation therapy.  2. Stable radiation changes medially in the right lung. No evidence of local recurrence or metastatic disease.  CT ABDOMEN AND PELVIS IMPRESSION  1. No evidence of abdominal pelvic metastatic disease.  2. Cholelithiasis, nonobstructing right renal calculus and diffuse atherosclerosis noted.   Electronically Signed   By: Roxy Horseman   On: 02/15/2013 14:57   ASSESSMENT AND PLAN: This is a very pleasant 79 years old white female with metastatic non-small cell lung cancer, adenocarcinoma with positive EGFR mutation currently undergoing treatment with Tarceva 100 mg by mouth daily and tolerating it fairly well. Her recent scan showed no evidence for disease progression. I discussed the scan results with the patient and her family. I recommended for her to continue her current treatment with Tarceva with the same dose. The patient would come back for followup visit in one month's for reevaluation with repeat CBC and comprehensive metabolic panel. She was advised to call immediately if she has any concerning symptoms in the  interval.  The patient voices understanding of current disease status and treatment options and is in agreement with the current care plan.  All questions were answered. The patient knows to call the clinic with any problems, questions or concerns. We can certainly see the patient much sooner if necessary.  I spent 15 minutes counseling the patient face to face. The total time spent in the appointment was 25 minutes.

## 2013-02-20 NOTE — Telephone Encounter (Signed)
gv and printed appt sched and avs for pt for Sept, OCT and NOV

## 2013-03-09 ENCOUNTER — Telehealth: Payer: Self-pay | Admitting: *Deleted

## 2013-03-09 NOTE — Telephone Encounter (Signed)
[name] called stating she is worried about her mother and that she feels she is not really able to appreciate that she is doing well.  She wants to believe that she is not.  Gave [name] Kathrin Penner' information to see if maybe she can help facilitate a good session with them to help them both understand how Ms. Bascom is working through her disease and diagnosis.

## 2013-03-14 ENCOUNTER — Telehealth: Payer: Self-pay | Admitting: *Deleted

## 2013-03-14 MED ORDER — DOXYCYCLINE HYCLATE 100 MG PO TABS: 100.0000 mg | ORAL_TABLET | Freq: Two times a day (BID) | ORAL | Status: DC

## 2013-03-14 MED ORDER — CLINDAMYCIN PHOSPHATE 1 % EX GEL: CUTANEOUS | Status: DC | PRN

## 2013-03-14 NOTE — Telephone Encounter (Signed)
Pt called stating that her thumb fingernails and toenails have been breaking off 1/2 way down and there are indentions in her nails.  Per Dr Donnald Garre, okay to give her doxycycline 100mg  BID x 7 days and clinadmycin gel.  Pt verbalized understanding.  SLJ

## 2013-03-15 ENCOUNTER — Other Ambulatory Visit: Payer: Self-pay

## 2013-03-15 DIAGNOSIS — Z1231 Encounter for screening mammogram for malignant neoplasm of breast: Secondary | ICD-10-CM

## 2013-03-15 DIAGNOSIS — Z803 Family history of malignant neoplasm of breast: Secondary | ICD-10-CM

## 2013-03-19 ENCOUNTER — Telehealth: Payer: Self-pay | Admitting: Medical Oncology

## 2013-03-19 DIAGNOSIS — R300 Dysuria: Secondary | ICD-10-CM

## 2013-03-19 NOTE — Telephone Encounter (Signed)
Pt thinks she has a kidney infection . Yesterday she had bladder pressure and urinary incontinence when she got up. She took pyridium and it helped decrease the burning. She has 3 tablets left. UA ordered.

## 2013-03-20 ENCOUNTER — Encounter: Payer: Self-pay | Admitting: Physician Assistant

## 2013-03-20 ENCOUNTER — Telehealth: Payer: Self-pay | Admitting: Internal Medicine

## 2013-03-20 ENCOUNTER — Ambulatory Visit (HOSPITAL_BASED_OUTPATIENT_CLINIC_OR_DEPARTMENT_OTHER): Payer: Medicare Other | Admitting: Physician Assistant

## 2013-03-20 ENCOUNTER — Other Ambulatory Visit (HOSPITAL_BASED_OUTPATIENT_CLINIC_OR_DEPARTMENT_OTHER): Payer: Medicare Other | Admitting: Lab

## 2013-03-20 DIAGNOSIS — J90 Pleural effusion, not elsewhere classified: Secondary | ICD-10-CM

## 2013-03-20 DIAGNOSIS — C3410 Malignant neoplasm of upper lobe, unspecified bronchus or lung: Secondary | ICD-10-CM

## 2013-03-20 DIAGNOSIS — R300 Dysuria: Secondary | ICD-10-CM

## 2013-03-20 LAB — CBC WITH DIFFERENTIAL/PLATELET
Basophils Absolute: 0.1 10*3/uL (ref 0.0–0.1)
Eosinophils Absolute: 0.5 10*3/uL (ref 0.0–0.5)
HCT: 42 % (ref 34.8–46.6)
HGB: 13.7 g/dL (ref 11.6–15.9)
LYMPH%: 6.9 % — ABNORMAL LOW (ref 14.0–49.7)
MCV: 91.8 fL (ref 79.5–101.0)
MONO#: 0.7 10*3/uL (ref 0.1–0.9)
MONO%: 9.6 % (ref 0.0–14.0)
NEUT#: 5.2 10*3/uL (ref 1.5–6.5)
NEUT%: 75.3 % (ref 38.4–76.8)
Platelets: 201 10*3/uL (ref 145–400)
WBC: 6.9 10*3/uL (ref 3.9–10.3)

## 2013-03-20 LAB — URINALYSIS, MICROSCOPIC - CHCC
Glucose: NEGATIVE mg/dL
Leukocyte Esterase: NEGATIVE
Protein: <30 mg/dL
Specific Gravity, Urine: 1.02 (ref 1.003–1.035)
Urobilinogen, UR: 0.2 mg/dL (ref 0.2–1)

## 2013-03-20 LAB — COMPREHENSIVE METABOLIC PANEL (CC13)
BUN: 24.6 mg/dL (ref 7.0–26.0)
CO2: 27 mEq/L (ref 22–29)
Calcium: 9.5 mg/dL (ref 8.4–10.4)
Chloride: 107 mEq/L (ref 98–109)
Creatinine: 1.1 mg/dL (ref 0.6–1.1)
Glucose: 85 mg/dl (ref 70–140)
Total Bilirubin: 1.22 mg/dL — ABNORMAL HIGH (ref 0.20–1.20)
Total Protein: 6.7 g/dL (ref 6.4–8.3)

## 2013-03-20 NOTE — Patient Instructions (Addendum)
Continue taking Tarceva 100 mg by mouth daily Keep your appointments for [facility] a Cath flushes Follow up in 1 month

## 2013-03-20 NOTE — Telephone Encounter (Signed)
Gave pt appt for October 2014 lab and MD

## 2013-03-20 NOTE — Progress Notes (Addendum)
[facility] Telephone:([phone]) [phone]   Fax:(336) [phone]  OFFICE PROGRESS NOTE  [name], MD [address] [facility], [place]. [place] [address]  DIAGNOSIS: Recurrent non-small cell lung cancer, adenocarcinoma with positive EGFR mutation in exon 19 and negative ALK gene translocation initially diagnosed as stage IIIA in (T1b N2 M0) in July 2012.   PRIOR THERAPY:  #1 Status post concurrent chemoradiation with weekly carboplatin and paclitaxel, last dose was given 03/01/2011.  #2 status post 3 seconds of consolidation chemotherapy was carboplatin for AUC of 5 and Alimta 500 mg/M2 giving him to see weeks, last dose was given 05/27/2011.  # 3 systemic chemotherapy with carboplatin for AUC of 5, paclitaxel 175 mg/M2 and Avastin 15 mg/kg every 3 weeks. Status post 6 cycles.  #4 maintenance therapy with the Avastin 15 mg/kg every 3 weeks, status post 6 cycles, last dose was given on 06/02/2012 discontinued today secondary to disease progression.  #5 Gilotrif (Afatinib) 40 mg by mouth daily started 06/27/2012. Status post approximately 1 month of therapy.  #6 Gilotrif (Afatinib) 30 mg by mouth daily status post approximately 3 months of therapy discontinued secondary to persistent diarrhea.   CURRENT THERAPY: Tarceva 100 mg by mouth daily status post 5 months.   CHEMOTHERAPY INTENT: Palliative  CURRENT # OF CHEMOTHERAPY CYCLES: 6  CURRENT ANTIEMETICS: Compazine  CURRENT SMOKING STATUS: Nonsmoker  ORAL CHEMOTHERAPY AND CONSENT: Afatinib followed by Tarceva. Consent obtained 06/22/2012.  CURRENT BISPHOSPHONATES USE: None  PAIN MANAGEMENT: No Pain  NARCOTICS INDUCED CONSTIPATION: N/A  LIVING WILL AND CODE STATUS: No CODE BLUE.  INTERVAL HISTORY: MONNICA SALTSMAN 79 y.o. female returns to the clinic today for followup visit.  The patient is feeling fine with no specific complaints today except for some mild urinary discomfort. She states that she was  feeling some bladder pressure was having some urinary leakage. She was taking doxycycline for some paronychia but only took for 3 days as she was having this bladder pressure and leakage. She states that she did not follow the directions exactly and did not realize she was supposed to drink at least 8 ounces of water with each dose of doxycycline. Once she stopped taking the doxycycline the urinary bladder pressure and leakage symptoms got better. She did not get the in the mycin gel filled because it was not covered by her pharmacy prescription benefits and the pharmacist told her that he would not strength in her nails. Her nail beds have improved although then nails still kind of get caught on things. She continues to have some intermittent dysuria and has gotten very good relief from the symptoms with Pyridium. She has 3 left from a prescription she got filled in April. This works very well for her and we are willing to refill this for her the future should she need this. She was wondering about whether it would be feasible for her to proceed with her mammogram. There is a strong family history of breast cancer having occurred in her mother, her grandmother as well as 2 sisters. She also states that her dermatologist wants to scrape/burn some lesions on her legs. Regarding her Tarceva she is tolerating this very well and has not had much in the way of skin rash and the diarrhea has improved significantly. She currently has not had any episodes of diarrhea. She voiced no other specific complaints today.. She denied having any significant chest pain, shortness of breath, cough or hemoptysis. The patient denied  having any nausea or vomiting. She has no fever or chills.   MEDICAL HISTORY: Past Medical History  Diagnosis Date  . Lung mass   . History of radiation therapy 7/31/212-03/08/2011    right upper lobe lung adenocarcinoma  . COPD (chronic obstructive pulmonary disease)   . Hypertension   .  Hypercholesterolemia   . Osteoporosis   . History of chemotherapy     carboplatin/paclitaxel  . lung ca July 2012    , right upper lobe  . Liver metastases dx'd 06/16/12    ALLERGIES:  is allergic to aleve and zyban.  MEDICATIONS:  Current Outpatient Prescriptions  Medication Sig Dispense Refill  . chlorhexidine (PERIDEX) 0.12 % solution Use as directed 15 mLs in the mouth or throat 2 (two) times daily.  240 mL  2  . clindamycin (CLINDAGEL) 1 % gel Apply topically as needed. Apply to fingernails 2-3 times daily prn  30 g  0  . doxycycline (VIBRA-TABS) 100 MG tablet Take 1 tablet (100 mg total) by mouth 2 (two) times daily. X 7 days  14 tablet  0  . lidocaine-prilocaine (EMLA) cream Apply topically as needed. Use as directed  30 g  1  . prochlorperazine (COMPAZINE) 10 MG tablet Take 1 tablet (10 mg total) by mouth every 6 (six) hours as needed.  30 tablet  1  . TARCEVA 100 MG tablet TAKE 1 TABLET BY MOUTH DAILY. TAKE ON AN EMPTY STOMACH 1 HOUR BEFORE MEALS OR 2 HOURS AFTER  30 tablet  2  . traMADol (ULTRAM) 50 MG tablet Take 1 tablet (50 mg total) by mouth every 6 (six) hours as needed. Maximum dose= 8 tablets per day  60 tablet  0   No current facility-administered medications for this visit.    SURGICAL HISTORY:  Past Surgical History  Procedure Laterality Date  . Abdominal hysterectomy      s/p for fibroid tumor  . Portacath placement      right ij     REVIEW OF SYSTEMS:  A comprehensive review of systems was negative except for: Gastrointestinal: positive for diarrhea   PHYSICAL EXAMINATION: General appearance: alert, cooperative and no distress Head: Normocephalic, without obvious abnormality, atraumatic Neck: no adenopathy Lymph nodes: Cervical, supraclavicular, and axillary nodes normal. Resp: clear to auscultation bilaterally Cardio: regular rate and rhythm, S1, S2 normal, no murmur, click, rub or gallop GI: soft, non-tender; bowel sounds normal; no masses,  no  organomegaly Extremities: extremities normal, atraumatic, no cyanosis or edema Neurologic: Alert and oriented X 3, normal strength and tone. Normal symmetric reflexes. Normal coordination and gait Skin: Nail beds reveal no paronychia, fingernails are thin and dry and peeling  ECOG PERFORMANCE STATUS: 1 - Symptomatic but completely ambulatory  Blood pressure 111/58, pulse 112, temperature 97.8 F (36.6 C), temperature source Oral, resp. rate 18, height 5' 4.5" (1.638 m), weight 135 lb 6.4 oz (61.417 kg).  LABORATORY DATA: Lab Results  Component Value Date   WBC 6.9 03/20/2013   HGB 13.7 03/20/2013   HCT 42.0 03/20/2013   MCV 91.8 03/20/2013   PLT 201 03/20/2013      Chemistry      Component Value Date/Time   NA 143 03/20/2013 1339   NA 136 01/25/2012 1137   NA 142 09/16/2011 0917   K 3.7 03/20/2013 1339   K 4.1 01/25/2012 1137   K 3.7 09/16/2011 0917   CL 106 11/22/2012 1104   CL 109 01/25/2012 1137   CL 106 09/16/2011 0917  CO2 27 03/20/2013 1339   CO2 23 01/25/2012 1137   CO2 29 09/16/2011 0917   BUN 24.6 03/20/2013 1339   BUN 12 01/25/2012 1137   BUN 16 09/16/2011 0917   CREATININE 1.1 03/20/2013 1339   CREATININE 0.62 01/25/2012 1137   CREATININE 0.8 09/16/2011 0917      Component Value Date/Time   CALCIUM 9.5 03/20/2013 1339   CALCIUM 9.3 01/25/2012 1137   CALCIUM 8.3 09/16/2011 0917   ALKPHOS 92 03/20/2013 1339   ALKPHOS 51 01/25/2012 1137   ALKPHOS 62 09/16/2011 0917   AST 18 03/20/2013 1339   AST 13 01/25/2012 1137   AST 18 09/16/2011 0917   ALT 13 03/20/2013 1339   ALT 14 01/25/2012 1137   ALT 18 09/16/2011 0917   BILITOT 1.22* 03/20/2013 1339   BILITOT 0.5 01/25/2012 1137   BILITOT 0.60 09/16/2011 0917       RADIOGRAPHIC STUDIES: Ct Chest W Contrast  02/15/2013   CLINICAL DATA:  Lung cancer diagnosed in July 2012 with radiation therapy completed. Chemotherapy ongoing.  EXAM: CT CHEST, ABDOMEN, AND PELVIS WITH CONTRAST  TECHNIQUE: Multidetector CT imaging of the chest, abdomen and pelvis was  performed following the standard protocol during bolus administration of intravenous contrast.  CONTRAST:  50mL OMNIPAQUE IOHEXOL 300 MG/ML SOLN, OMNIPAQUE IOHEXOL 300 MG/ML SOLN  COMPARISON:  Prior examinations 11/20/2012 and 08/28/2012.  FINDINGS:   CT CHEST FINDINGS  Right IJ Port-A-Cath tip is at the SVC right atrial junction. An 8 mm low-density right thyroid lesion on image number 1 was not previously imaged. There are no enlarged mediastinal, hilar or axillary lymph nodes. There is distortion of the right hilum with perihilar scarring attributed to prior radiation therapy.  No new or enlarging pulmonary nodules are identified. Small subpleural nodules and mild emphysema are again noted.  A dependent right pleural effusion has enlarged, now moderate in size. This demonstrates no definite soft tissue components. There is no pericardial or left pleural effusion. There is stable atherosclerosis of the aorta, great vessels and coronary arteries.  No worrisome osseous findings are demonstrated.   CT ABDOMEN AND PELVIS FINDINGS  There is stable atrophy of the left hepatic lobe without evidence of central mass or biliary dilatation. The portal and hepatic veins are patent. Small calcified gallstones are noted. There is no gallbladder wall thickening or biliary dilatation. The spleen, pancreas and adrenal glands appear normal.  There is a stable tiny nonobstructing calculus in the lower pole of the right kidney. Small cortical and parapelvic cysts are present within both kidneys. There is no hydronephrosis.  There is stable diffuse atherosclerosis of the aorta, iliac arteries and proximal visceral arteries. Femoral femoral graft is patent. A 7 mm left periaortic node on image 71 is minimally more prominent. No pathologically enlarged abdominal pelvic lymph nodes are seen. There is no ascites or peritoneal nodularity. Wall thickening of the distal stomach on the portal phase images is less evident on the  delayed images, likely due to contraction. No significant abnormalities of the small bowel or colon are seen. Sigmoid colon diverticular changes are noted.  There is no evidence of pelvic mass status post hysterectomy. The bladder has a stable appearance. There are no worrisome osseous findings. There is spondylosis with ankylosis across the L4-5 disc. Scattered small sclerotic lesions are stable.  IMPRESSION: CT CHEST IMPRESSION  1. Further enlargement of right pleural effusion without pleural-based nodularity. This may be related to prior radiation therapy.  2. Stable radiation changes  medially in the right lung. No evidence of local recurrence or metastatic disease.  CT ABDOMEN AND PELVIS IMPRESSION  1. No evidence of abdominal pelvic metastatic disease.  2. Cholelithiasis, nonobstructing right renal calculus and diffuse atherosclerosis noted.   Electronically Signed   By: Roxy Horseman   On: 02/15/2013 14:57   ASSESSMENT AND PLAN: This is a very pleasant 79 years old white female with metastatic non-small cell lung cancer, adenocarcinoma with positive EGFR mutation currently undergoing treatment with Tarceva 100 mg by mouth daily and tolerating it fairly well. Her recent scan showed no evidence for disease progression. Patient was discussed with also seen by Dr. Arbutus Ped. She will continue on Tarceva at 100 mg by mouth daily. She will continue having her Port-A-Cath flushed as scheduled. She'll followup in one month for another symptom management visit with a repeat CBC differential and C. met. Regarding her intermittent episodes of mild dysuria again. He works well for this and without any fever or suspicion of infection we will refill for Pyridium if she needs this in the future.  Laural Benes, Feather Berrie E, PA-C   She was advised to call immediately if she has any concerning symptoms in the interval.  The patient voices understanding of current disease status and treatment options and is in agreement with the  current care plan.  All questions were answered. The patient knows to call the clinic with any problems, questions or concerns. We can certainly see the patient much sooner if necessary.  . ADDENDUM: Hematology/Oncology Attending: I have a face to face encounter with the patient. I recommended her care plan. This is a very pleasant 79 years old white female with metastatic non-small cell lung cancer, adenocarcinoma with positive EGFR mutation currently on treatment with Tarceva 100 mg by mouth daily and tolerating it fairly well. There is improvement in her paronychia and the patient stopped using doxycycline. She denied having any significant diarrhea. I recommended for the patient to continue her treatment was Tarceva with the same dose. She would come back for followup visit in one month for reevaluation and management any adverse effect of her treatment. She was advised to call immediately if she has any concerning symptoms in the interval. Lajuana Matte., MD 03/20/2013

## 2013-04-03 ENCOUNTER — Ambulatory Visit (HOSPITAL_BASED_OUTPATIENT_CLINIC_OR_DEPARTMENT_OTHER): Payer: Medicare Other

## 2013-04-03 DIAGNOSIS — Z452 Encounter for adjustment and management of vascular access device: Secondary | ICD-10-CM

## 2013-04-03 DIAGNOSIS — C3410 Malignant neoplasm of upper lobe, unspecified bronchus or lung: Secondary | ICD-10-CM

## 2013-04-03 MED ORDER — HEPARIN SOD (PORK) LOCK FLUSH 100 UNIT/ML IV SOLN
500.0000 [IU] | Freq: Once | INTRAVENOUS | Status: AC
  Administered 2013-04-03: 500 [IU] via INTRAVENOUS
  Filled 2013-04-03: qty 5

## 2013-04-03 MED ORDER — SODIUM CHLORIDE 0.9 % IJ SOLN
10.0000 mL | INTRAMUSCULAR | Status: DC | PRN
  Administered 2013-04-03: 10 mL via INTRAVENOUS
  Filled 2013-04-03: qty 10

## 2013-04-03 NOTE — Patient Instructions (Signed)

## 2013-04-04 ENCOUNTER — Ambulatory Visit
Admission: RE | Admit: 2013-04-04 | Discharge: 2013-04-04 | Disposition: A | Discharge status: Home / Self Care | Payer: Medicare Other | Source: Ambulatory Visit

## 2013-04-04 DIAGNOSIS — Z1231 Encounter for screening mammogram for malignant neoplasm of breast: Secondary | ICD-10-CM

## 2013-04-04 DIAGNOSIS — Z803 Family history of malignant neoplasm of breast: Secondary | ICD-10-CM

## 2013-04-05 ENCOUNTER — Other Ambulatory Visit: Payer: Self-pay | Admitting: Dermatology

## 2013-04-17 ENCOUNTER — Other Ambulatory Visit (HOSPITAL_BASED_OUTPATIENT_CLINIC_OR_DEPARTMENT_OTHER): Payer: Medicare Other | Admitting: Lab

## 2013-04-17 ENCOUNTER — Encounter: Payer: Self-pay | Admitting: Internal Medicine

## 2013-04-17 ENCOUNTER — Ambulatory Visit (HOSPITAL_BASED_OUTPATIENT_CLINIC_OR_DEPARTMENT_OTHER): Payer: Medicare Other | Admitting: Internal Medicine

## 2013-04-17 ENCOUNTER — Telehealth: Payer: Self-pay | Admitting: *Deleted

## 2013-04-17 DIAGNOSIS — C3490 Malignant neoplasm of unspecified part of unspecified bronchus or lung: Secondary | ICD-10-CM

## 2013-04-17 LAB — CBC WITH DIFFERENTIAL/PLATELET
BASO%: 0.7 % (ref 0.0–2.0)
EOS%: 10 % — ABNORMAL HIGH (ref 0.0–7.0)
Eosinophils Absolute: 0.6 10*3/uL — ABNORMAL HIGH (ref 0.0–0.5)
HCT: 39.3 % (ref 34.8–46.6)
LYMPH%: 3.9 % — ABNORMAL LOW (ref 14.0–49.7)
MCHC: 32.3 g/dL (ref 31.5–36.0)
MONO#: 0.6 10*3/uL (ref 0.1–0.9)
NEUT#: 4.9 10*3/uL (ref 1.5–6.5)
NEUT%: 76.7 % (ref 38.4–76.8)
Platelets: 170 10*3/uL (ref 145–400)
RBC: 4.4 10*6/uL (ref 3.70–5.45)
WBC: 6.4 10*3/uL (ref 3.9–10.3)
lymph#: 0.3 10*3/uL — ABNORMAL LOW (ref 0.9–3.3)

## 2013-04-17 LAB — COMPREHENSIVE METABOLIC PANEL (CC13)
ALT: 16 U/L (ref 0–55)
AST: 20 U/L (ref 5–34)
Albumin: 3.1 g/dL — ABNORMAL LOW (ref 3.5–5.0)
Alkaline Phosphatase: 92 U/L (ref 40–150)
Anion Gap: 9 mEq/L (ref 3–11)
CO2: 27 mEq/L (ref 22–29)
Calcium: 9.5 mg/dL (ref 8.4–10.4)
Chloride: 106 mEq/L (ref 98–109)
Glucose: 115 mg/dl (ref 70–140)
Potassium: 4 mEq/L (ref 3.5–5.1)
Sodium: 142 mEq/L (ref 136–145)
Total Bilirubin: 1.29 mg/dL — ABNORMAL HIGH (ref 0.20–1.20)
Total Protein: 6.4 g/dL (ref 6.4–8.3)

## 2013-04-17 NOTE — Telephone Encounter (Signed)
appts made and printed. Pt is aware that cs will call w/ appts for CT CHEST, CT ABD, AND MR BRAIN.Monique KitchenMarland KitchenTD

## 2013-04-17 NOTE — Progress Notes (Signed)
[facility] Telephone:(336) [phone]   Fax:(336) [phone]  OFFICE PROGRESS NOTE  [name], MD [address] [facility], [place]. [place] [address]  DIAGNOSIS: Recurrent non-small cell lung cancer, adenocarcinoma with positive EGFR mutation in exon 19 and negative ALK gene translocation initially diagnosed as stage IIIA in (T1b N2 M0) in July 2012.   PRIOR THERAPY:  #1 Status post concurrent chemoradiation with weekly carboplatin and paclitaxel, last dose was given 03/01/2011.  #2 status post 3 seconds of consolidation chemotherapy was carboplatin for AUC of 5 and Alimta 500 mg/M2 giving him to see weeks, last dose was given 05/27/2011.  # 3 systemic chemotherapy with carboplatin for AUC of 5, paclitaxel 175 mg/M2 and Avastin 15 mg/kg every 3 weeks. Status post 6 cycles.  #4 maintenance therapy with the Avastin 15 mg/kg every 3 weeks, status post 6 cycles, last dose was given on 06/02/2012 discontinued today secondary to disease progression.  #5 Gilotrif (Afatinib) 40 mg by mouth daily started 06/27/2012. Status post approximately 1 month of therapy.  #6 Gilotrif (Afatinib) 30 mg by mouth daily status post approximately 3 months of therapy discontinued secondary to persistent diarrhea.   CURRENT THERAPY: Tarceva 100 mg by mouth daily status post 6 months.   CHEMOTHERAPY INTENT: Palliative  CURRENT # OF CHEMOTHERAPY CYCLES: 7  CURRENT ANTIEMETICS: Compazine  CURRENT SMOKING STATUS: Nonsmoker  ORAL CHEMOTHERAPY AND CONSENT: Afatinib followed by Tarceva. Consent obtained 06/22/2012.  CURRENT BISPHOSPHONATES USE: None  PAIN MANAGEMENT: No Pain  NARCOTICS INDUCED CONSTIPATION: N/A  LIVING WILL AND CODE STATUS: No CODE BLUE.   INTERVAL HISTORY: [name] 79 y.o. female returns to the clinic today for follow up visit accompanied by her husband. The patient is feeling fine today with no specific complaints. She is tolerating her treatment with  Tarceva fairly well with no significant diarrhea or skin rash. The patient denied having any chest pain, shortness breath, cough or hemoptysis.  MEDICAL HISTORY: Past Medical History  Diagnosis Date  . Lung mass   . History of radiation therapy 7/31/212-03/08/2011    right upper lobe lung adenocarcinoma  . COPD (chronic obstructive pulmonary disease)   . Hypertension   . Hypercholesterolemia   . Osteoporosis   . History of chemotherapy     carboplatin/paclitaxel  . lung ca July 2012    , right upper lobe  . Liver metastases dx'd 06/16/12    ALLERGIES:  is allergic to aleve and zyban.  MEDICATIONS:  Current Outpatient Prescriptions  Medication Sig Dispense Refill  . chlorhexidine (PERIDEX) 0.12 % solution Use as directed 15 mLs in the mouth or throat 2 (two) times daily.  240 mL  2  . lidocaine-prilocaine (EMLA) cream Apply topically as needed. Use as directed  30 g  1  . TARCEVA 100 MG tablet TAKE 1 TABLET BY MOUTH DAILY. TAKE ON AN EMPTY STOMACH 1 HOUR BEFORE MEALS OR 2 HOURS AFTER  30 tablet  2  . traMADol (ULTRAM) 50 MG tablet Take 1 tablet (50 mg total) by mouth every 6 (six) hours as needed. Maximum dose= 8 tablets per day  60 tablet  0  . clindamycin (CLINDAGEL) 1 % gel Apply topically as needed. Apply to fingernails 2-3 times daily prn  30 g  0  . prochlorperazine (COMPAZINE) 10 MG tablet Take 1 tablet (10 mg total) by mouth every 6 (six) hours as needed.  30 tablet  1   No current facility-administered medications for this  visit.    SURGICAL HISTORY:  Past Surgical History  Procedure Laterality Date  . Abdominal hysterectomy      s/p for fibroid tumor  . Portacath placement      right ij     REVIEW OF SYSTEMS:  A comprehensive review of systems was negative.   PHYSICAL EXAMINATION: General appearance: alert, cooperative and no distress Head: Normocephalic, without obvious abnormality, atraumatic Neck: no adenopathy, no JVD, supple, symmetrical, trachea midline  and thyroid not enlarged, symmetric, no tenderness/mass/nodules Lymph nodes: Cervical, supraclavicular, and axillary nodes normal. Resp: clear to auscultation bilaterally Back: symmetric, no curvature. ROM normal. No CVA tenderness. Cardio: regular rate and rhythm, S1, S2 normal, no murmur, click, rub or gallop GI: soft, non-tender; bowel sounds normal; no masses,  no organomegaly Extremities: extremities normal, atraumatic, no cyanosis or edema  ECOG PERFORMANCE STATUS: 1 - Symptomatic but completely ambulatory  Blood pressure 135/69, pulse 92, temperature 98 F (36.7 C), temperature source Oral, resp. rate 19, height 5' 4.5" (1.638 m), weight 134 lb 11.2 oz (61.1 kg).  LABORATORY DATA: Lab Results  Component Value Date   WBC 6.4 04/17/2013   HGB 12.7 04/17/2013   HCT 39.3 04/17/2013   MCV 89.2 04/17/2013   PLT 170 04/17/2013      Chemistry      Component Value Date/Time   NA 142 04/17/2013 1023   NA 136 01/25/2012 1137   NA 142 09/16/2011 0917   K 4.0 04/17/2013 1023   K 4.1 01/25/2012 1137   K 3.7 09/16/2011 0917   CL 106 11/22/2012 1104   CL 109 01/25/2012 1137   CL 106 09/16/2011 0917   CO2 27 04/17/2013 1023   CO2 23 01/25/2012 1137   CO2 29 09/16/2011 0917   BUN 13.3 04/17/2013 1023   BUN 12 01/25/2012 1137   BUN 16 09/16/2011 0917   CREATININE 1.0 04/17/2013 1023   CREATININE 0.62 01/25/2012 1137   CREATININE 0.8 09/16/2011 0917      Component Value Date/Time   CALCIUM 9.5 04/17/2013 1023   CALCIUM 9.3 01/25/2012 1137   CALCIUM 8.3 09/16/2011 0917   ALKPHOS 92 04/17/2013 1023   ALKPHOS 51 01/25/2012 1137   ALKPHOS 62 09/16/2011 0917   AST 20 04/17/2013 1023   AST 13 01/25/2012 1137   AST 18 09/16/2011 0917   ALT 16 04/17/2013 1023   ALT 14 01/25/2012 1137   ALT 18 09/16/2011 0917   BILITOT 1.29* 04/17/2013 1023   BILITOT 0.5 01/25/2012 1137   BILITOT 0.60 09/16/2011 0917       RADIOGRAPHIC STUDIES: Mm Digital Screening  04/05/2013   CLINICAL DATA:  Screening.  EXAM: DIGITAL  SCREENING BILATERAL MAMMOGRAM WITH CAD  DIGITAL BREAST TOMOSYNTHESIS  Digital breast tomosynthesis images are acquired in two projections. These images are reviewed in combination with the digital mammogram, confirming the findings below.  COMPARISON:  Previous exam(s).  ACR Breast Density Category b: There are scattered areas of fibroglandular density.  FINDINGS: There are no findings suspicious for malignancy. Images were processed with CAD.  IMPRESSION: No mammographic evidence of malignancy. A result letter of this screening mammogram will be mailed directly to the patient.  RECOMMENDATION: Screening mammogram in one year. (Code:SM-B-01Y)  BI-RADS CATEGORY  1: Negative   Electronically Signed   By: Esperanza Heir M.D.   On: 04/05/2013 17:23    ASSESSMENT AND PLAN: This is a very pleasant 79 years old white female with metastatic non-small cell lung cancer, adenocarcinoma with positive EGFR  mutation currently on treatment with Tarceva 100 mg by mouth daily and tolerating it fairly well. I recommended for the patient to continue her treatment with Tarceva with the same dose. She would come back for followup visit in one month with repeat CT scan of the chest, abdomen and pelvis as well as brain MRI for reevaluation and management any adverse effect of her treatment.  She was advised to call immediately if she has any concerning symptoms in the interval.  The patient voices understanding of current disease status and treatment options and is in agreement with the current care plan.  All questions were answered. The patient knows to call the clinic with any problems, questions or concerns. We can certainly see the patient much sooner if necessary.

## 2013-04-18 ENCOUNTER — Encounter: Payer: Self-pay | Admitting: Internal Medicine

## 2013-04-18 NOTE — Patient Instructions (Signed)
Follow up visit in one month with repeat CT scan of the chest, abdomen and pelvis as well as brain MRI

## 2013-04-26 ENCOUNTER — Other Ambulatory Visit: Payer: Self-pay

## 2013-05-04 ENCOUNTER — Other Ambulatory Visit: Payer: Self-pay | Admitting: Internal Medicine

## 2013-05-04 DIAGNOSIS — C3490 Malignant neoplasm of unspecified part of unspecified bronchus or lung: Secondary | ICD-10-CM

## 2013-05-08 ENCOUNTER — Other Ambulatory Visit: Payer: Self-pay | Admitting: Medical Oncology

## 2013-05-09 ENCOUNTER — Telehealth: Payer: Self-pay | Admitting: Medical Oncology

## 2013-05-09 ENCOUNTER — Telehealth: Payer: Self-pay | Admitting: Internal Medicine

## 2013-05-09 MED ORDER — OXYCODONE-ACETAMINOPHEN 5-325 MG PO TABS: 1.0000 | ORAL_TABLET | ORAL | Status: DC | PRN

## 2013-05-09 NOTE — Telephone Encounter (Signed)
Prescription left in injection room and pt notified to pick it up.

## 2013-05-09 NOTE — Telephone Encounter (Signed)
S.w. Pt and advised on 11.26.14 appt @ 2:30pm with Dr. Kathrynn Running

## 2013-05-10 ENCOUNTER — Inpatient Hospital Stay (HOSPITAL_COMMUNITY): Payer: Medicare Other

## 2013-05-10 ENCOUNTER — Encounter (HOSPITAL_COMMUNITY): Payer: Self-pay | Admitting: Emergency Medicine

## 2013-05-10 ENCOUNTER — Emergency Department (HOSPITAL_COMMUNITY): Payer: Medicare Other

## 2013-05-10 ENCOUNTER — Inpatient Hospital Stay (HOSPITAL_COMMUNITY)
Admission: EM | Admit: 2013-05-10 | Discharge: 2013-05-15 | DRG: 064 | Disposition: A | Discharge status: Rehabilitation Facility | Payer: Medicare Other | Attending: Internal Medicine | Admitting: Internal Medicine

## 2013-05-10 DIAGNOSIS — I248 Other forms of acute ischemic heart disease: Secondary | ICD-10-CM | POA: Diagnosis present

## 2013-05-10 DIAGNOSIS — R4701 Aphasia: Secondary | ICD-10-CM | POA: Diagnosis present

## 2013-05-10 DIAGNOSIS — Z803 Family history of malignant neoplasm of breast: Secondary | ICD-10-CM

## 2013-05-10 DIAGNOSIS — C7951 Secondary malignant neoplasm of bone: Secondary | ICD-10-CM | POA: Diagnosis present

## 2013-05-10 DIAGNOSIS — I2489 Other forms of acute ischemic heart disease: Secondary | ICD-10-CM | POA: Diagnosis present

## 2013-05-10 DIAGNOSIS — T451X5A Adverse effect of antineoplastic and immunosuppressive drugs, initial encounter: Secondary | ICD-10-CM | POA: Diagnosis present

## 2013-05-10 DIAGNOSIS — C801 Malignant (primary) neoplasm, unspecified: Secondary | ICD-10-CM

## 2013-05-10 DIAGNOSIS — Z9221 Personal history of antineoplastic chemotherapy: Secondary | ICD-10-CM

## 2013-05-10 DIAGNOSIS — Z7982 Long term (current) use of aspirin: Secondary | ICD-10-CM

## 2013-05-10 DIAGNOSIS — D6859 Other primary thrombophilia: Secondary | ICD-10-CM | POA: Diagnosis present

## 2013-05-10 DIAGNOSIS — I639 Cerebral infarction, unspecified: Secondary | ICD-10-CM | POA: Diagnosis present

## 2013-05-10 DIAGNOSIS — I739 Peripheral vascular disease, unspecified: Secondary | ICD-10-CM | POA: Diagnosis present

## 2013-05-10 DIAGNOSIS — I6340 Cerebral infarction due to embolism of unspecified cerebral artery: Principal | ICD-10-CM | POA: Diagnosis present

## 2013-05-10 DIAGNOSIS — I6529 Occlusion and stenosis of unspecified carotid artery: Secondary | ICD-10-CM | POA: Diagnosis present

## 2013-05-10 DIAGNOSIS — Z923 Personal history of irradiation: Secondary | ICD-10-CM

## 2013-05-10 DIAGNOSIS — I214 Non-ST elevation (NSTEMI) myocardial infarction: Secondary | ICD-10-CM | POA: Diagnosis present

## 2013-05-10 DIAGNOSIS — E7800 Pure hypercholesterolemia, unspecified: Secondary | ICD-10-CM | POA: Diagnosis present

## 2013-05-10 DIAGNOSIS — R29810 Facial weakness: Secondary | ICD-10-CM | POA: Diagnosis present

## 2013-05-10 DIAGNOSIS — N289 Disorder of kidney and ureter, unspecified: Secondary | ICD-10-CM | POA: Diagnosis not present

## 2013-05-10 DIAGNOSIS — R7401 Elevation of levels of liver transaminase levels: Secondary | ICD-10-CM | POA: Diagnosis present

## 2013-05-10 DIAGNOSIS — D696 Thrombocytopenia, unspecified: Secondary | ICD-10-CM | POA: Diagnosis present

## 2013-05-10 DIAGNOSIS — R7402 Elevation of levels of lactic acid dehydrogenase (LDH): Secondary | ICD-10-CM | POA: Diagnosis present

## 2013-05-10 DIAGNOSIS — N390 Urinary tract infection, site not specified: Secondary | ICD-10-CM | POA: Diagnosis present

## 2013-05-10 DIAGNOSIS — I658 Occlusion and stenosis of other precerebral arteries: Secondary | ICD-10-CM | POA: Diagnosis present

## 2013-05-10 DIAGNOSIS — Z8041 Family history of malignant neoplasm of ovary: Secondary | ICD-10-CM

## 2013-05-10 DIAGNOSIS — E43 Unspecified severe protein-calorie malnutrition: Secondary | ICD-10-CM | POA: Diagnosis present

## 2013-05-10 DIAGNOSIS — R000 Tachycardia, unspecified: Secondary | ICD-10-CM | POA: Diagnosis not present

## 2013-05-10 DIAGNOSIS — E785 Hyperlipidemia, unspecified: Secondary | ICD-10-CM | POA: Diagnosis present

## 2013-05-10 DIAGNOSIS — Z79899 Other long term (current) drug therapy: Secondary | ICD-10-CM

## 2013-05-10 DIAGNOSIS — Z87891 Personal history of nicotine dependence: Secondary | ICD-10-CM

## 2013-05-10 DIAGNOSIS — G8190 Hemiplegia, unspecified affecting unspecified side: Secondary | ICD-10-CM | POA: Diagnosis present

## 2013-05-10 DIAGNOSIS — R197 Diarrhea, unspecified: Secondary | ICD-10-CM | POA: Diagnosis present

## 2013-05-10 DIAGNOSIS — J4489 Other specified chronic obstructive pulmonary disease: Secondary | ICD-10-CM | POA: Diagnosis present

## 2013-05-10 DIAGNOSIS — H53469 Homonymous bilateral field defects, unspecified side: Secondary | ICD-10-CM | POA: Diagnosis present

## 2013-05-10 DIAGNOSIS — C787 Secondary malignant neoplasm of liver and intrahepatic bile duct: Secondary | ICD-10-CM | POA: Diagnosis present

## 2013-05-10 DIAGNOSIS — Z85118 Personal history of other malignant neoplasm of bronchus and lung: Secondary | ICD-10-CM

## 2013-05-10 DIAGNOSIS — R7989 Other specified abnormal findings of blood chemistry: Secondary | ICD-10-CM

## 2013-05-10 DIAGNOSIS — M810 Age-related osteoporosis without current pathological fracture: Secondary | ICD-10-CM | POA: Diagnosis present

## 2013-05-10 DIAGNOSIS — C3490 Malignant neoplasm of unspecified part of unspecified bronchus or lung: Secondary | ICD-10-CM | POA: Diagnosis present

## 2013-05-10 DIAGNOSIS — I10 Essential (primary) hypertension: Secondary | ICD-10-CM | POA: Diagnosis present

## 2013-05-10 DIAGNOSIS — J449 Chronic obstructive pulmonary disease, unspecified: Secondary | ICD-10-CM | POA: Diagnosis present

## 2013-05-10 LAB — COMPREHENSIVE METABOLIC PANEL
ALT: 57 U/L — ABNORMAL HIGH (ref 0–35)
AST: 60 U/L — ABNORMAL HIGH (ref 0–37)
Albumin: 2.8 g/dL — ABNORMAL LOW (ref 3.5–5.2)
CO2: 25 mEq/L (ref 19–32)
Chloride: 100 mEq/L (ref 96–112)
GFR calc non Af Amer: 55 mL/min — ABNORMAL LOW (ref >90)
Potassium: 3.5 mEq/L (ref 3.5–5.1)
Sodium: 139 mEq/L (ref 135–145)
Total Bilirubin: 1.4 mg/dL — ABNORMAL HIGH (ref 0.3–1.2)

## 2013-05-10 LAB — POCT I-STAT, CHEM 8
BUN: 24 mg/dL — ABNORMAL HIGH (ref 6–23)
Chloride: 102 mEq/L (ref 96–112)
Creatinine, Ser: 1.2 mg/dL — ABNORMAL HIGH (ref 0.50–1.10)
Glucose, Bld: 118 mg/dL — ABNORMAL HIGH (ref 70–99)
HCT: 39 % (ref 36.0–46.0)
Hemoglobin: 13.3 g/dL (ref 12.0–15.0)
Potassium: 3.5 mEq/L (ref 3.5–5.1)
Sodium: 138 mEq/L (ref 135–145)
TCO2: 26 mmol/L (ref 0–100)

## 2013-05-10 LAB — DIFFERENTIAL
Basophils Absolute: 0 10*3/uL (ref 0.0–0.1)
Basophils Relative: 0 % (ref 0–1)
Lymphocytes Relative: 5 % — ABNORMAL LOW (ref 12–46)
Lymphs Abs: 0.6 10*3/uL — ABNORMAL LOW (ref 0.7–4.0)
Monocytes Absolute: 0.4 10*3/uL (ref 0.1–1.0)
Neutro Abs: 10.5 10*3/uL — ABNORMAL HIGH (ref 1.7–7.7)
Neutrophils Relative %: 92 % — ABNORMAL HIGH (ref 43–77)

## 2013-05-10 LAB — CBC
HCT: 38.3 % (ref 36.0–46.0)
MCHC: 32.9 g/dL (ref 30.0–36.0)
Platelets: 73 10*3/uL — ABNORMAL LOW (ref 150–400)
RBC: 4.38 MIL/uL (ref 3.87–5.11)
RDW: 14.2 % (ref 11.5–15.5)
WBC: 11.5 10*3/uL — ABNORMAL HIGH (ref 4.0–10.5)

## 2013-05-10 LAB — ETHANOL: Alcohol, Ethyl (B): <11 mg/dL (ref 0–11)

## 2013-05-10 LAB — RAPID URINE DRUG SCREEN, HOSP PERFORMED
Amphetamines: NOT DETECTED
Barbiturates: NOT DETECTED
Opiates: NOT DETECTED
Tetrahydrocannabinol: NOT DETECTED

## 2013-05-10 LAB — TROPONIN I: Troponin I: 7.55 ng/mL (ref <0.30)

## 2013-05-10 LAB — URINALYSIS, ROUTINE W REFLEX MICROSCOPIC
Nitrite: NEGATIVE
Protein, ur: 30 mg/dL — AB
Specific Gravity, Urine: 1.022 (ref 1.005–1.030)
Urobilinogen, UA: 1 mg/dL (ref 0.0–1.0)
pH: 6 (ref 5.0–8.0)

## 2013-05-10 LAB — PROTIME-INR: INR: 1.22 (ref 0.00–1.49)

## 2013-05-10 LAB — POCT I-STAT TROPONIN I

## 2013-05-10 LAB — APTT: aPTT: 27 seconds (ref 24–37)

## 2013-05-10 LAB — URINE MICROSCOPIC-ADD ON

## 2013-05-10 LAB — GLUCOSE, CAPILLARY: Glucose-Capillary: 117 mg/dL — ABNORMAL HIGH (ref 70–99)

## 2013-05-10 MED ORDER — TRAMADOL HCL 50 MG PO TABS
50.0000 mg | ORAL_TABLET | Freq: Four times a day (QID) | ORAL | Status: DC | PRN
  Administered 2013-05-11 – 2013-05-14 (×5): 50 mg via ORAL
  Filled 2013-05-10 (×5): qty 1

## 2013-05-10 MED ORDER — ACETAMINOPHEN 325 MG PO TABS
650.0000 mg | ORAL_TABLET | ORAL | Status: DC | PRN
  Administered 2013-05-12: 650 mg via ORAL
  Filled 2013-05-10: qty 2

## 2013-05-10 MED ORDER — SODIUM CHLORIDE 0.9 % IV SOLN: INTRAVENOUS | Status: AC

## 2013-05-10 MED ORDER — ERLOTINIB HCL 100 MG PO TABS
100.0000 mg | ORAL_TABLET | Freq: Every day | ORAL | Status: DC
  Administered 2013-05-11 – 2013-05-15 (×5): 100 mg via ORAL
  Filled 2013-05-10 (×6): qty 1

## 2013-05-10 MED ORDER — GADOBENATE DIMEGLUMINE 529 MG/ML IV SOLN
15.0000 mL | Freq: Once | INTRAVENOUS | Status: AC | PRN
  Administered 2013-05-10: 12 mL via INTRAVENOUS

## 2013-05-10 MED ORDER — ACETAMINOPHEN 650 MG RE SUPP: 650.0000 mg | RECTAL | Status: DC | PRN

## 2013-05-10 MED ORDER — DEXTROSE 5 % IV SOLN
1.0000 g | Freq: Once | INTRAVENOUS | Status: AC
  Administered 2013-05-10: 1 g via INTRAVENOUS
  Filled 2013-05-10: qty 10

## 2013-05-10 MED ORDER — DIPHENOXYLATE-ATROPINE 2.5-0.025 MG PO TABS: 1.0000 | ORAL_TABLET | Freq: Four times a day (QID) | ORAL | Status: DC | PRN

## 2013-05-10 MED ORDER — DEXTROSE 5 % IV SOLN
1.0000 g | INTRAVENOUS | Status: DC
  Administered 2013-05-11 – 2013-05-12 (×2): 1 g via INTRAVENOUS
  Filled 2013-05-10 (×3): qty 10

## 2013-05-10 MED ORDER — OXYCODONE-ACETAMINOPHEN 5-325 MG PO TABS
1.0000 | ORAL_TABLET | Freq: Three times a day (TID) | ORAL | Status: DC | PRN
  Administered 2013-05-10 – 2013-05-15 (×10): 1 via ORAL
  Filled 2013-05-10 (×10): qty 1

## 2013-05-10 MED ORDER — ASPIRIN 300 MG RE SUPP
300.0000 mg | Freq: Every day | RECTAL | Status: DC
Start: 2013-05-10 — End: 2013-05-15
  Administered 2013-05-10: 300 mg via RECTAL
  Filled 2013-05-10 (×6): qty 1

## 2013-05-10 MED ORDER — ASPIRIN 325 MG PO TABS
325.0000 mg | ORAL_TABLET | Freq: Every day | ORAL | Status: DC
  Administered 2013-05-11 – 2013-05-15 (×5): 325 mg via ORAL
  Filled 2013-05-10 (×7): qty 1

## 2013-05-10 NOTE — ED Notes (Signed)
Melvenia Beam, PA at the bedside with patient and granddaughter

## 2013-05-10 NOTE — Plan of Care (Signed)
Attending MD paged to let him know that pt is on the unit.

## 2013-05-10 NOTE — ED Notes (Signed)
Cardiologist at bedside.  

## 2013-05-10 NOTE — H&P (Addendum)
[facility]  History and Physical  [name] ZOX:[id number] DOB: 19-Mar-1932 DOA: 05/10/2013  Referring physician: EDP PCP: [name], MD  Outpatient Specialists:  1. Medical Oncology: Dr. Si Gaul  Chief Complaint: Abnormal speech, right sided weakness and found on floor.  HPI: [name] is a 79 y.o. female with history of metastatic non-small cell lung cancer, adenocarcinoma with positive EGFR mutation, a liver metastasis,? Right shoulder metastasis, on Tarceva, COPD, hypertension, hypercholesterolemia, former smoker, presented to the ED after she was found down in her room. Patient is unable to provide history secondary to abnormal speech. History is provided by patient's granddaughter and spouse who live with the patient at home and are currently at bedside. She was last seen normal at 9:45 PM on 05/09/13. She is an independent lady and fully coherent. At approximately 6:50 AM on 05/10/13, granddaughter heard knocking sound coming out of patient's room. When she went in, she found patient lying down on the carpeted floor a few feet away from her bed. No report regarding fall, syncope, chest pain or palpitations. She was found to have facial asymmetry, confusion, difficult to understand speech and right-sided weakness. She was transported to the ED where CT head and neck was negative, mild transaminitis, troponin 1.84. Neuro hospitalist have consulted and are requesting MRI brain with contrast rule out stroke versus brain metastasis. Patient has passed bedside swallow screen. Hospitalist admission requested. Since arrival in the ED, speech is apparently improved.  Review of Systems: All systems reviewed and apart from history of presenting illness, pertinent for chronic diarrhea up to 3-4 times daily which has been ongoing for approximately a one year. This was worse before and has improved.  Past Medical History  Diagnosis Date  . Lung mass   . History of radiation  therapy 7/31/212-03/08/2011    right upper lobe lung adenocarcinoma  . COPD (chronic obstructive pulmonary disease)   . Hypertension   . Hypercholesterolemia   . Osteoporosis   . History of chemotherapy     carboplatin/paclitaxel  . lung ca July 2012    , right upper lobe  . Liver metastases dx'd 06/16/12   Past Surgical History  Procedure Laterality Date  . Abdominal hysterectomy      s/p for fibroid tumor  . Portacath placement      right ij   . Back surgery     Social History:  reports that she quit smoking about 6 years ago. Her smoking use included Cigarettes. She has a 50 pack-year smoking history. She does not have any smokeless tobacco history on file. She reports that she drinks alcohol. She reports that she does not use illicit drugs. Independent of activities of daily living.  Allergies  Allergen Reactions  . Aleve [Naproxen Sodium] Rash  . Zyban [Bupropion Hcl] Rash    Family History  Problem Relation Age of Onset  . Breast cancer Mother   . Breast cancer Sister     2 sisters   . Ovarian cancer Maternal Aunt   . Breast cancer Maternal Grandmother     Prior to Admission medications   Medication Sig Start Date End Date Taking? Authorizing Provider  diphenoxylate-atropine (LOMOTIL) 2.5-0.025 MG per tablet Take 1 tablet by mouth 4 (four) times daily as needed for diarrhea or loose stools.   Yes Historical Provider, MD  lidocaine-prilocaine (EMLA) cream Apply topically as needed. Use as directed 05/09/12  Yes Adrena E Johnson, PA-C  oxyCODONE-acetaminophen (PERCOCET/ROXICET) 5-325 MG per tablet Take 1 tablet  by mouth every 4 (four) hours as needed for severe pain. 05/09/13  Yes Si Gaul, MD  prochlorperazine (COMPAZINE) 10 MG tablet Take 1 tablet (10 mg total) by mouth every 6 (six) hours as needed. 07/11/12  Yes Si Gaul, MD  TARCEVA 100 MG tablet TAKE 1 TABLET BY MOUTH DAILY. TAKE ON AN EMPTY STOMACH 1 HOUR BEFORE MEALS OR 2 HOURS AFTER 05/04/13  Yes  Si Gaul, MD  traMADol (ULTRAM) 50 MG tablet Take 1 tablet (50 mg total) by mouth every 6 (six) hours as needed. Maximum dose= 8 tablets per day 03/07/12  Yes Conni Slipper, PA-C   Physical Exam: Filed Vitals:   05/10/13 1115 05/10/13 1130 05/10/13 1145 05/10/13 1200  BP: 109/44 102/59 123/63 108/59  Pulse: 107 103 120 108  Temp:      TempSrc:      Resp: 22 21 23 25   Height:      Weight:      SpO2: 93% 94% 95% 95%     General exam: Moderately built and nourished female patient, lying comfortably supine on the gurney in no obvious distress.  Head, eyes and ENT: Nontraumatic and normocephalic. Pupils equally reacting to light and accommodation. Oral mucosa moist.  Neck: Supple. No JVD, carotid bruit or thyromegaly.  Lymphatics: No lymphadenopathy.  Respiratory system: Clear to auscultation. No increased work of breathing.  Cardiovascular system: S1 and S2 heard, RRR. No JVD, murmurs, gallops, clicks or pedal edema.  Gastrointestinal system: Abdomen is nondistended, soft and nontender. Normal bowel sounds heard. No organomegaly or masses appreciated.  Central nervous system: Alert , facial asymmetry + and expressive aphasia. Follow some instructions..   Extremities: Grade 5 x 5 power left limbs, 0 x 5 power right upper extremity and 2 x 5 right lower extremity. Peripheral pulses symmetrically felt.   Skin: No rashes or acute findings.  Musculoskeletal system: Negative exam.  Psychiatry: Pleasant and cooperative.   Labs on Admission:  Basic Metabolic Panel:  Recent Labs Lab 05/10/13 0858 05/10/13 0908  NA 139 138  K 3.5 3.5  CL 100 102  CO2 25  --   GLUCOSE 119* 118*  BUN 24* 24*  CREATININE 0.96 1.20*  CALCIUM 9.1  --    Liver Function Tests:  Recent Labs Lab 05/10/13 0858  AST 60*  ALT 57*  ALKPHOS 177*  BILITOT 1.4*  PROT 6.3  ALBUMIN 2.8*   No results found for this basename: LIPASE, AMYLASE,  in the last 168 hours No results found for  this basename: AMMONIA,  in the last 168 hours CBC:  Recent Labs Lab 05/10/13 0858 05/10/13 0908  WBC 11.5*  --   NEUTROABS 10.5*  --   HGB 12.6 13.3  HCT 38.3 39.0  MCV 87.4  --   PLT 73*  --    Cardiac Enzymes:  Recent Labs Lab 05/10/13 0858  TROPONINI 1.84*    BNP (last 3 results) No results found for this basename: PROBNP,  in the last 8760 hours CBG:  Recent Labs Lab 05/10/13 1022  GLUCAP 117*    Radiological Exams on Admission: Ct Head Wo Contrast  05/10/2013   CLINICAL DATA:  Weakness  EXAM: CT HEAD WITHOUT CONTRAST  CT CERVICAL SPINE WITHOUT CONTRAST  TECHNIQUE: Multidetector CT imaging of the head and cervical spine was performed following the standard protocol without intravenous contrast. Multiplanar CT image reconstructions of the cervical spine were also generated.  COMPARISON:  MRI 09/30/2011  FINDINGS: CT HEAD FINDINGS  Generalized  atrophy. Chronic microvascular ischemic change in the white matter. Chronic infarct left cerebellum not present previously.  Negative for acute infarct, hemorrhage, or mass lesion. No edema or midline shift. Negative for skull lesion.  CT CERVICAL SPINE FINDINGS  Normal cervical alignment. Negative for fracture or mass lesion. Mild disc and mild facet degeneration.  IMPRESSION: Atrophy and chronic ischemia.  No acute abnormality.  Mild cervical degenerative change.  No acute bony abnormality.   Electronically Signed   By: Marlan Palau M.D.   On: 05/10/2013 09:04   Ct Cervical Spine Wo Contrast  05/10/2013   CLINICAL DATA:  Weakness  EXAM: CT HEAD WITHOUT CONTRAST  CT CERVICAL SPINE WITHOUT CONTRAST  TECHNIQUE: Multidetector CT imaging of the head and cervical spine was performed following the standard protocol without intravenous contrast. Multiplanar CT image reconstructions of the cervical spine were also generated.  COMPARISON:  MRI 09/30/2011  FINDINGS: CT HEAD FINDINGS  Generalized atrophy. Chronic microvascular ischemic change  in the white matter. Chronic infarct left cerebellum not present previously.  Negative for acute infarct, hemorrhage, or mass lesion. No edema or midline shift. Negative for skull lesion.  CT CERVICAL SPINE FINDINGS  Normal cervical alignment. Negative for fracture or mass lesion. Mild disc and mild facet degeneration.  IMPRESSION: Atrophy and chronic ischemia.  No acute abnormality.  Mild cervical degenerative change.  No acute bony abnormality.   Electronically Signed   By: Marlan Palau M.D.   On: 05/10/2013 09:04    EKG: Independently reviewed. Sinus tachycardia at 107 beats per minute, normal axis, occasional APCs and no acute changes.  Assessment/Plan Principal Problem:   CVA (cerebral infarction), right hemiplegia and expressive aphasia Active Problems:   Lung cancer   COPD (chronic obstructive pulmonary disease)   Hypertension   Hypercholesterolemia   Peripheral vascular disease, unspecified   Thrombocytopenia   UTI (lower urinary tract infection)   Elevated troponin   Presumed CVA with right hemi-plegia and expressive aphasia - Given history of metastatic lung cancer, obviously need to rule out brain metastasis. - Admit to neuro telemetry. Complete stroke workup-MRI brain with contrast, 2-D echo, carotid Dopplers, fasting lipids and hemoglobin A1c. - Patient passed bedside swallow screen. She was not on any antithrombotic PTA. Started aspirin for secondary stroke prophylaxis.  - PT, OT and ST evaluation.  - Neurology consultation appreciated.   UTI - Apparently recurrent secondary to diarrhea - Continue IV Rocephin pending urine culture results  Elevated troponin - No reported chest pain. EKG without acute changes. - Possibly demand ischemia from problem #1. Check CK to rule out rhabdomyolysis as etiology. - Cycle cardiac enzymes. Check 2-D echocardiogram. Continue aspirin. - Cardiology consulted.  Thrombocytopenia  - ? Related to chemotherapy.  - Follow CBCs.    COPD - Stable  Hypertension - Controlled  Hyperlipidemia - Check fasting lipids    Chronic diarrhea - Secondary to chemotherapy  Lung cancer with liver metastases and? Metastasis to right shoulder. -Will alert oncology of patient's admission.   Code Status:  Full   Family Communication:  discussed with spouse and granddaughter at bedside   Disposition Plan:  to be determined    Time spent:  65 minutes.  Marcellus Scott, MD, FACP, FHM. [facility] Pager [phone]  If 7PM-7AM, please contact night-coverage [email or web address] Password [facility] 05/10/2013, 12:25 PM

## 2013-05-10 NOTE — ED Provider Notes (Signed)
Patient presents to the emergency room with complaints of acute right-sided hemiparesis. Patient's last time noted to be normal was 2145 last evening. Patient has a complicated medical history including cancer receiving chemotherapy.  Physical Exam  BP 130/67  Pulse 107  Temp(Src) 98 F (36.7 C) (Oral)  Resp 18  Ht 5\' 4"  (1.626 m)  Wt 134 lb (60.782 kg)  BMI 22.99 kg/m2  SpO2 99%  Physical Exam  Nursing note and vitals reviewed. Constitutional: She appears well-nourished. No distress.  HENT:  Head: Normocephalic and atraumatic.  Right Ear: External ear normal.  Left Ear: External ear normal.  Eyes: Conjunctivae are normal. Right eye exhibits no discharge. Left eye exhibits no discharge. No scleral icterus.  Neck: Neck supple. No tracheal deviation present.  Cardiovascular: Normal rate and regular rhythm.   Pulmonary/Chest: Effort normal and breath sounds normal. No stridor. No respiratory distress.  Musculoskeletal: She exhibits no edema.  Neurological: She is alert. A sensory deficit is present. Cranial nerve deficit: right sided facial droop. She displays no seizure activity. Coordination abnormal.  Right sided hemiparesis, right sided neglect  Skin: Skin is warm and dry. No rash noted. She is not diaphoretic.  Psychiatric: She has a normal mood and affect.   EKG Interpretation    Date/Time:  Thursday May 10 2013 08:32:58 EST Ventricular Rate:  112 PR Interval:  103 QRS Duration: 72 QT Interval:  325 QTC Calculation: 444 R Axis:   83 Text Interpretation:  Sinus tachycardia with irregular rate Borderline right axis deviation No significant change since last tracing Confirmed by Angeletta Goelz  MD-J, Quanta Roher ([id number]) on 05/10/2013 8:41:10 AM            ED Course  Procedures  MDM Pt symptoms concerning for acute stroke.  Pt is not a candidate for TPA due to onset greater than 12 hours ago.  UTI noted as well and will start abx in the ED.  Plan on admission, MRI further  evaluation.  Neuro consult ordered.      Celene Kras, MD 05/10/13 1001

## 2013-05-10 NOTE — Plan of Care (Signed)
Pt is transported to the room via bed from ED. Pt is stable. Vs obtained and tele placed. Pt is alert and oriented x 1.

## 2013-05-10 NOTE — ED Notes (Signed)
PA at bedside.

## 2013-05-10 NOTE — ED Notes (Signed)
CBG of 117 checked

## 2013-05-10 NOTE — Consult Note (Signed)
Referring Physician: Lynelle Doctor    Chief Complaint: Stroke versus mets  HPI:                                                                                                                                         [name] is an 79 y.o. female who has known lung and liver cancer. 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  Patient is followed by Dr. Arbutus Ped as out patient . Family noted patient to be normal last night but this am when family went to check on her they were unable to open the bedroom due to patient behind door and not able to understand them.  On consultation patient is globally aphasic, alert and shows a right facial droop and arm>leg weakness. Patient was on no ASA prior to hospitalization.    Date last known well: Date: 05/09/2013 Time last known well: Time: 21:30 tPA Given: No: out of the window  Past Medical History  Diagnosis Date  . Lung mass   . History of radiation therapy 7/31/212-03/08/2011    right upper lobe lung adenocarcinoma  . COPD (chronic obstructive pulmonary disease)   . Hypertension   . Hypercholesterolemia   . Osteoporosis   . History of chemotherapy     carboplatin/paclitaxel  . lung ca July 2012    , right upper lobe  . Liver metastases dx'd 06/16/12    Past Surgical History  Procedure Laterality Date  . Abdominal hysterectomy      s/p for fibroid tumor  . Portacath placement      right ij   . Back surgery      Family History  Problem Relation Age of Onset  . Breast cancer Mother   . Breast cancer Sister     2 sisters   . Ovarian cancer Maternal Aunt   . Breast cancer Maternal Grandmother    Social History:  reports that she quit smoking about 6 years ago. Her smoking use included Cigarettes. She has a 50 pack-year smoking history. She does not have any smokeless tobacco history on file. She reports that she drinks alcohol. She reports that she does not use illicit  drugs.  Allergies:  Allergies  Allergen Reactions  . Aleve [Naproxen Sodium] Rash  . Zyban [Bupropion Hcl] Rash    Medications:  Current Facility-Administered Medications  Medication Dose Route Frequency Provider Last Rate Last Dose  . cefTRIAXone (ROCEPHIN) 1 g in dextrose 5 % 50 mL IVPB  1 g Intravenous Once Arnoldo Hooker, PA-C       Current Outpatient Prescriptions  Medication Sig Dispense Refill  . diphenoxylate-atropine (LOMOTIL) 2.5-0.025 MG per tablet Take 1 tablet by mouth 4 (four) times daily as needed for diarrhea or loose stools.      . lidocaine-prilocaine (EMLA) cream Apply topically as needed. Use as directed  30 g  1  . oxyCODONE-acetaminophen (PERCOCET/ROXICET) 5-325 MG per tablet Take 1 tablet by mouth every 4 (four) hours as needed for severe pain.  30 tablet  0  . prochlorperazine (COMPAZINE) 10 MG tablet Take 1 tablet (10 mg total) by mouth every 6 (six) hours as needed.  30 tablet  1  . TARCEVA 100 MG tablet TAKE 1 TABLET BY MOUTH DAILY. TAKE ON AN EMPTY STOMACH 1 HOUR BEFORE MEALS OR 2 HOURS AFTER  30 tablet  2  . traMADol (ULTRAM) 50 MG tablet Take 1 tablet (50 mg total) by mouth every 6 (six) hours as needed. Maximum dose= 8 tablets per day  60 tablet  0      ROS:                                                                                                                                       History obtained from daughter in law  General ROS: negative for - chills, fatigue, fever, night sweats, weight gain or weight loss Psychological ROS: negative for - behavioral disorder, hallucinations, memory difficulties, mood swings or suicidal ideation Ophthalmic ROS: negative for - blurry vision, double vision, eye pain or loss of vision ENT ROS: negative for - epistaxis, nasal discharge, oral lesions, sore throat, tinnitus or vertigo Allergy  and Immunology ROS: negative for - hives or itchy/watery eyes Hematological and Lymphatic ROS: negative for - bleeding problems, bruising or swollen lymph nodes Endocrine ROS: negative for - galactorrhea, hair pattern changes, polydipsia/polyuria or temperature intolerance Respiratory ROS: negative for - cough, hemoptysis, shortness of breath or wheezing Cardiovascular ROS: negative for - chest pain, dyspnea on exertion, edema or irregular heartbeat Gastrointestinal ROS: negative for - abdominal pain, diarrhea, hematemesis, nausea/vomiting or stool incontinence Genito-Urinary ROS: negative for - dysuria, hematuria, incontinence or urinary frequency/urgency Musculoskeletal ROS: negative for - joint swelling or muscular weakness Neurological ROS: as noted in HPI Dermatological ROS: negative for rash and skin lesion changes  Neurologic Examination:  Blood pressure 98/60, pulse 107, temperature 98 F (36.7 C), temperature source Oral, resp. rate 22, height 5\' 4"  (1.626 m), weight 60.782 kg (134 lb), SpO2 93.00%.  Mental Status: Alert, globally aphasic. Unable to follow simple commands. Cranial Nerves: II: Discs flat bilaterally; Visual fields blinks to threat bilaterally, pupils equal, round, reactive to light and accommodation III,IV, VI: ptosis not present, extra-ocular motions intact bilaterally V,VII: smile asymmetric on the right, facial light touch sensation normal bilaterally VIII: hearing normal bilaterally IX,X: gag reflex present XI: bilateral shoulder shrug XII: midline tongue extension without atrophy or fasciculations  Motor: Right : Upper extremity   1/5    Left:     Upper extremity   5/5  Lower extremity   4/5     Lower extremity   5/5 Tone and bulk:normal tone throughout; no atrophy noted Sensory: Pinprick and light touch intact throughout, bilaterally Deep Tendon Reflexes:   Right: Upper Extremity   Left: Upper extremity   biceps (C-5 to C-6) 2/4   biceps (C-5 to C-6) 2/4 tricep (C7) 2/4    triceps (C7) 2/4 Brachioradialis (C6) 2/4  Brachioradialis (C6) 2/4  Lower Extremity Lower Extremity  quadriceps (L-2 to L-4) 2/4   quadriceps (L-2 to L-4) 2/4 Achilles (S1) 2/4   Achilles (S1) 2/4  Plantars: Right: up going   Left: downgoing Cerebellar: Unable to obtain due to aphasia Gait: not assessed CV: pulses palpable throughout    Results for orders placed during the hospital encounter of 05/10/13 (from the past 48 hour(s))  ETHANOL     Status: None   Collection Time    05/10/13  8:58 AM      Result Value Range   Alcohol, Ethyl (B) <11  0 - 11 mg/dL   Comment:            LOWEST DETECTABLE LIMIT FOR     SERUM ALCOHOL IS 11 mg/dL     FOR MEDICAL PURPOSES ONLY  PROTIME-INR     Status: None   Collection Time    05/10/13  8:58 AM      Result Value Range   Prothrombin Time 15.1  11.6 - 15.2 seconds   INR 1.22  0.00 - 1.49  APTT     Status: None   Collection Time    05/10/13  8:58 AM      Result Value Range   aPTT 27  24 - 37 seconds  CBC     Status: Abnormal   Collection Time    05/10/13  8:58 AM      Result Value Range   WBC 11.5 (*) 4.0 - 10.5 K/uL   RBC 4.38  3.87 - 5.11 MIL/uL   Hemoglobin 12.6  12.0 - 15.0 g/dL   HCT 16.1  09.6 - 04.5 %   MCV 87.4  78.0 - 100.0 fL   MCH 28.8  26.0 - 34.0 pg   MCHC 32.9  30.0 - 36.0 g/dL   RDW 40.9  81.1 - 91.4 %   Platelets 73 (*) 150 - 400 K/uL   Comment: PLATELET COUNT CONFIRMED BY SMEAR  DIFFERENTIAL     Status: Abnormal   Collection Time    05/10/13  8:58 AM      Result Value Range   Neutrophils Relative % 92 (*) 43 - 77 %   Neutro Abs 10.5 (*) 1.7 - 7.7 K/uL   Lymphocytes Relative 5 (*) 12 - 46 %   Lymphs Abs 0.6 (*) 0.7 - 4.0  K/uL   Monocytes Relative 3  3 - 12 %   Monocytes Absolute 0.4  0.1 - 1.0 K/uL   Eosinophils Relative 0  0 - 5 %   Eosinophils Absolute 0.0  0.0 - 0.7 K/uL    Basophils Relative 0  0 - 1 %   Basophils Absolute 0.0  0.0 - 0.1 K/uL  COMPREHENSIVE METABOLIC PANEL     Status: Abnormal   Collection Time    05/10/13  8:58 AM      Result Value Range   Sodium 139  135 - 145 mEq/L   Potassium 3.5  3.5 - 5.1 mEq/L   Chloride 100  96 - 112 mEq/L   CO2 25  19 - 32 mEq/L   Glucose, Bld 119 (*) 70 - 99 mg/dL   BUN 24 (*) 6 - 23 mg/dL   Creatinine, Ser 1.61  0.50 - 1.10 mg/dL   Calcium 9.1  8.4 - 09.6 mg/dL   Total Protein 6.3  6.0 - 8.3 g/dL   Albumin 2.8 (*) 3.5 - 5.2 g/dL   AST 60 (*) 0 - 37 U/L   ALT 57 (*) 0 - 35 U/L   Alkaline Phosphatase 177 (*) 39 - 117 U/L   Total Bilirubin 1.4 (*) 0.3 - 1.2 mg/dL   GFR calc non Af Amer 55 (*) >90 mL/min   GFR calc Af Amer 64 (*) >90 mL/min   Comment: (NOTE)     The eGFR has been calculated using the CKD EPI equation.     This calculation has not been validated in all clinical situations.     eGFR's persistently <90 mL/min signify possible Chronic Kidney     Disease.  TROPONIN I     Status: Abnormal   Collection Time    05/10/13  8:58 AM      Result Value Range   Troponin I 1.84 (*) <0.30 ng/mL   Comment:            Due to the release kinetics of cTnI,     a negative result within the first hours     of the onset of symptoms does not rule out     myocardial infarction with certainty.     If myocardial infarction is still suspected,     repeat the test at appropriate intervals.     REPEATED TO VERIFY     CRITICAL RESULT CALLED TO, READ BACK BY AND VERIFIED WITH:     K.COLLINS,RN [id number] 05/10/13 CLARK,S  POCT I-STAT TROPONIN I     Status: Abnormal   Collection Time    05/10/13  9:06 AM      Result Value Range   Troponin i, poc 1.36 (*) 0.00 - 0.08 ng/mL   Comment NOTIFIED PHYSICIAN     Comment 3            Comment: Due to the release kinetics of cTnI,     a negative result within the first hours     of the onset of symptoms does not rule out     myocardial infarction with certainty.     If  myocardial infarction is still suspected,     repeat the test at appropriate intervals.  POCT I-STAT, CHEM 8     Status: Abnormal   Collection Time    05/10/13  9:08 AM      Result Value Range   Sodium 138  135 - 145 mEq/L   Potassium 3.5  3.5 -  5.1 mEq/L   Chloride 102  96 - 112 mEq/L   BUN 24 (*) 6 - 23 mg/dL   Creatinine, Ser 1.61 (*) 0.50 - 1.10 mg/dL   Glucose, Bld 096 (*) 70 - 99 mg/dL   Calcium, Ion 0.45  4.09 - 1.30 mmol/L   TCO2 26  0 - 100 mmol/L   Hemoglobin 13.3  12.0 - 15.0 g/dL   HCT 81.1  91.4 - 78.2 %  URINE RAPID DRUG SCREEN (HOSP PERFORMED)     Status: None   Collection Time    05/10/13  9:11 AM      Result Value Range   Opiates NONE DETECTED  NONE DETECTED   Cocaine NONE DETECTED  NONE DETECTED   Benzodiazepines NONE DETECTED  NONE DETECTED   Amphetamines NONE DETECTED  NONE DETECTED   Tetrahydrocannabinol NONE DETECTED  NONE DETECTED   Barbiturates NONE DETECTED  NONE DETECTED   Comment:            DRUG SCREEN FOR MEDICAL PURPOSES     ONLY.  IF CONFIRMATION IS NEEDED     FOR ANY PURPOSE, NOTIFY LAB     WITHIN 5 DAYS.                LOWEST DETECTABLE LIMITS     FOR URINE DRUG SCREEN     Drug Class       Cutoff (ng/mL)     Amphetamine      1000     Barbiturate      200     Benzodiazepine   200     Tricyclics       300     Opiates          300     Cocaine          300     THC              50  URINALYSIS, ROUTINE W REFLEX MICROSCOPIC     Status: Abnormal   Collection Time    05/10/13  9:11 AM      Result Value Range   Color, Urine RED (*) YELLOW   Comment: BIOCHEMICALS MAY BE AFFECTED BY COLOR   APPearance CLOUDY (*) CLEAR   Specific Gravity, Urine 1.022  1.005 - 1.030   pH 6.0  5.0 - 8.0   Glucose, UA NEGATIVE  NEGATIVE mg/dL   Hgb urine dipstick LARGE (*) NEGATIVE   Bilirubin Urine SMALL (*) NEGATIVE   Ketones, ur 15 (*) NEGATIVE mg/dL   Protein, ur 30 (*) NEGATIVE mg/dL   Urobilinogen, UA 1.0  0.0 - 1.0 mg/dL   Nitrite NEGATIVE  NEGATIVE    Leukocytes, UA MODERATE (*) NEGATIVE  URINE MICROSCOPIC-ADD ON     Status: Abnormal   Collection Time    05/10/13  9:11 AM      Result Value Range   WBC, UA 11-20  <3 WBC/hpf   RBC / HPF TOO NUMEROUS TO COUNT  <3 RBC/hpf   Bacteria, UA MANY (*) RARE  GLUCOSE, CAPILLARY     Status: Abnormal   Collection Time    05/10/13 10:22 AM      Result Value Range   Glucose-Capillary 117 (*) 70 - 99 mg/dL   Ct Head Wo Contrast  05/10/2013   CLINICAL DATA:  Weakness  EXAM: CT HEAD WITHOUT CONTRAST  CT CERVICAL SPINE WITHOUT CONTRAST  TECHNIQUE: Multidetector CT imaging of the head and cervical spine was performed following the  standard protocol without intravenous contrast. Multiplanar CT image reconstructions of the cervical spine were also generated.  COMPARISON:  MRI 09/30/2011  FINDINGS: CT HEAD FINDINGS  Generalized atrophy. Chronic microvascular ischemic change in the white matter. Chronic infarct left cerebellum not present previously.  Negative for acute infarct, hemorrhage, or mass lesion. No edema or midline shift. Negative for skull lesion.  CT CERVICAL SPINE FINDINGS  Normal cervical alignment. Negative for fracture or mass lesion. Mild disc and mild facet degeneration.  IMPRESSION: Atrophy and chronic ischemia.  No acute abnormality.  Mild cervical degenerative change.  No acute bony abnormality.   Electronically Signed   By: Marlan Palau M.D.   On: 05/10/2013 09:04   Ct Cervical Spine Wo Contrast  05/10/2013   CLINICAL DATA:  Weakness  EXAM: CT HEAD WITHOUT CONTRAST  CT CERVICAL SPINE WITHOUT CONTRAST  TECHNIQUE: Multidetector CT imaging of the head and cervical spine was performed following the standard protocol without intravenous contrast. Multiplanar CT image reconstructions of the cervical spine were also generated.  COMPARISON:  MRI 09/30/2011  FINDINGS: CT HEAD FINDINGS  Generalized atrophy. Chronic microvascular ischemic change in the white matter. Chronic infarct left cerebellum not  present previously.  Negative for acute infarct, hemorrhage, or mass lesion. No edema or midline shift. Negative for skull lesion.  CT CERVICAL SPINE FINDINGS  Normal cervical alignment. Negative for fracture or mass lesion. Mild disc and mild facet degeneration.  IMPRESSION: Atrophy and chronic ischemia.  No acute abnormality.  Mild cervical degenerative change.  No acute bony abnormality.   Electronically Signed   By: Marlan Palau M.D.   On: 05/10/2013 09:04    Assessment and plan discussed with with attending physician and they are in agreement.    Felicie Morn PA-C [facility] [phone]  05/10/2013, 10:27 AM   Assessment: 79 y.o. female with new onset global aphasia, right facial droop and right arm weakness.  Likely left cortical infarct although CT head showed no acute infarct.  Must also consider possible mets given history of lung cancer.   Stroke Risk Factors - hyperlipidemia and hypertension  Recommend: 1) Stroke workup including MRI brain WITH contrast to evaluate for possible metastasis 2) Initiate ASA PO/PR 3) Speech therapy/OT/PT    Patient seen and examined together with physician assistant and I concur with the assessment and plan.  Wyatt Portela, MD

## 2013-05-10 NOTE — ED Notes (Signed)
Family at bedside. 

## 2013-05-10 NOTE — ED Notes (Signed)
MD with Neurology at bedside.

## 2013-05-10 NOTE — Consult Note (Signed)
Reason for Consult: Elevate Troponin Referring Physician: TRH  Note: the history was obtained from the patient and chart review. No family was present during this encounter. Due to her expressive aphasia, the history and ROS is limited.   HPI: The patient is a 79 y/o female with history of metastatic non-small cell lung cancer, adenocarcinoma, a liver metastasis, and questionable right shoulder metastasis, who presented to [facility] today for evaluation of abnormal speech,  and right sided weakness. Her other history is significant for COPD, HTN, and hypercholesterolemia. No known cardiac history. W/u in the ED has revealed a negative CT scan. MRI is pending to evaluate for CVA vs mets. We have been consulted to evaluate due to a positive troponin of 1.20.  She denies chest pain when asked and has no SOB. Other than her expressive aphasia and right sided weakness, she appears comfortable at rest. Vitals are stable. EKG w/o acute changes.    Past Medical History  Diagnosis Date  . Lung mass   . History of radiation therapy 7/31/212-03/08/2011    right upper lobe lung adenocarcinoma  . COPD (chronic obstructive pulmonary disease)   . Hypertension   . Hypercholesterolemia   . Osteoporosis   . History of chemotherapy     carboplatin/paclitaxel  . lung ca July 2012    , right upper lobe  . Liver metastases dx'd 06/16/12    Past Surgical History  Procedure Laterality Date  . Abdominal hysterectomy      s/p for fibroid tumor  . Portacath placement      right ij   . Back surgery      Family History  Problem Relation Age of Onset  . Breast cancer Mother   . Breast cancer Sister     2 sisters   . Ovarian cancer Maternal Aunt   . Breast cancer Maternal Grandmother     Social History:  reports that she quit smoking about 6 years ago. Her smoking use included Cigarettes. She has a 50 pack-year smoking history. She does not have any smokeless tobacco history on file. She reports that she  drinks alcohol. She reports that she does not use illicit drugs.  Allergies:  Allergies  Allergen Reactions  . Aleve [Naproxen Sodium] Rash  . Zyban [Bupropion Hcl] Rash    Medications:  Prior to Admission medications   Medication Sig Start Date End Date Taking? Authorizing Provider  diphenoxylate-atropine (LOMOTIL) 2.5-0.025 MG per tablet Take 1 tablet by mouth 4 (four) times daily as needed for diarrhea or loose stools.   Yes Historical Provider, MD  lidocaine-prilocaine (EMLA) cream Apply topically as needed. Use as directed 05/09/12  Yes Adrena E Johnson, PA-C  oxyCODONE-acetaminophen (PERCOCET/ROXICET) 5-325 MG per tablet Take 1 tablet by mouth every 4 (four) hours as needed for severe pain. 05/09/13  Yes Si Gaul, MD  prochlorperazine (COMPAZINE) 10 MG tablet Take 1 tablet (10 mg total) by mouth every 6 (six) hours as needed. 07/11/12  Yes Si Gaul, MD  TARCEVA 100 MG tablet TAKE 1 TABLET BY MOUTH DAILY. TAKE ON AN EMPTY STOMACH 1 HOUR BEFORE MEALS OR 2 HOURS AFTER 05/04/13  Yes Si Gaul, MD  traMADol (ULTRAM) 50 MG tablet Take 1 tablet (50 mg total) by mouth every 6 (six) hours as needed. Maximum dose= 8 tablets per day 03/07/12  Yes Conni Slipper, PA-C     Results for orders placed during the hospital encounter of 05/10/13 (from the past 48 hour(s))  ETHANOL  Status: None   Collection Time    05/10/13  8:58 AM      Result Value Range   Alcohol, Ethyl (B) <11  0 - 11 mg/dL   Comment:            LOWEST DETECTABLE LIMIT FOR     SERUM ALCOHOL IS 11 mg/dL     FOR MEDICAL PURPOSES ONLY  PROTIME-INR     Status: None   Collection Time    05/10/13  8:58 AM      Result Value Range   Prothrombin Time 15.1  11.6 - 15.2 seconds   INR 1.22  0.00 - 1.49  APTT     Status: None   Collection Time    05/10/13  8:58 AM      Result Value Range   aPTT 27  24 - 37 seconds  CBC     Status: Abnormal   Collection Time    05/10/13  8:58 AM      Result Value Range    WBC 11.5 (*) 4.0 - 10.5 K/uL   RBC 4.38  3.87 - 5.11 MIL/uL   Hemoglobin 12.6  12.0 - 15.0 g/dL   HCT 16.1  09.6 - 04.5 %   MCV 87.4  78.0 - 100.0 fL   MCH 28.8  26.0 - 34.0 pg   MCHC 32.9  30.0 - 36.0 g/dL   RDW 40.9  81.1 - 91.4 %   Platelets 73 (*) 150 - 400 K/uL   Comment: PLATELET COUNT CONFIRMED BY SMEAR  DIFFERENTIAL     Status: Abnormal   Collection Time    05/10/13  8:58 AM      Result Value Range   Neutrophils Relative % 92 (*) 43 - 77 %   Neutro Abs 10.5 (*) 1.7 - 7.7 K/uL   Lymphocytes Relative 5 (*) 12 - 46 %   Lymphs Abs 0.6 (*) 0.7 - 4.0 K/uL   Monocytes Relative 3  3 - 12 %   Monocytes Absolute 0.4  0.1 - 1.0 K/uL   Eosinophils Relative 0  0 - 5 %   Eosinophils Absolute 0.0  0.0 - 0.7 K/uL   Basophils Relative 0  0 - 1 %   Basophils Absolute 0.0  0.0 - 0.1 K/uL  COMPREHENSIVE METABOLIC PANEL     Status: Abnormal   Collection Time    05/10/13  8:58 AM      Result Value Range   Sodium 139  135 - 145 mEq/L   Potassium 3.5  3.5 - 5.1 mEq/L   Chloride 100  96 - 112 mEq/L   CO2 25  19 - 32 mEq/L   Glucose, Bld 119 (*) 70 - 99 mg/dL   BUN 24 (*) 6 - 23 mg/dL   Creatinine, Ser 7.82  0.50 - 1.10 mg/dL   Calcium 9.1  8.4 - 95.6 mg/dL   Total Protein 6.3  6.0 - 8.3 g/dL   Albumin 2.8 (*) 3.5 - 5.2 g/dL   AST 60 (*) 0 - 37 U/L   ALT 57 (*) 0 - 35 U/L   Alkaline Phosphatase 177 (*) 39 - 117 U/L   Total Bilirubin 1.4 (*) 0.3 - 1.2 mg/dL   GFR calc non Af Amer 55 (*) >90 mL/min   GFR calc Af Amer 64 (*) >90 mL/min   Comment: (NOTE)     The eGFR has been calculated using the CKD EPI equation.     This calculation has not  been validated in all clinical situations.     eGFR's persistently <90 mL/min signify possible Chronic Kidney     Disease.  TROPONIN I     Status: Abnormal   Collection Time    05/10/13  8:58 AM      Result Value Range   Troponin I 1.84 (*) <0.30 ng/mL   Comment:            Due to the release kinetics of cTnI,     a negative result within the  first hours     of the onset of symptoms does not rule out     myocardial infarction with certainty.     If myocardial infarction is still suspected,     repeat the test at appropriate intervals.     REPEATED TO VERIFY     CRITICAL RESULT CALLED TO, READ BACK BY AND VERIFIED WITH:     K.COLLINS,RN [id number] 05/10/13 CLARK,S  CK     Status: None   Collection Time    05/10/13  8:58 AM      Result Value Range   Total CK 155  7 - 177 U/L  POCT I-STAT TROPONIN I     Status: Abnormal   Collection Time    05/10/13  9:06 AM      Result Value Range   Troponin i, poc 1.36 (*) 0.00 - 0.08 ng/mL   Comment NOTIFIED PHYSICIAN     Comment 3            Comment: Due to the release kinetics of cTnI,     a negative result within the first hours     of the onset of symptoms does not rule out     myocardial infarction with certainty.     If myocardial infarction is still suspected,     repeat the test at appropriate intervals.  POCT I-STAT, CHEM 8     Status: Abnormal   Collection Time    05/10/13  9:08 AM      Result Value Range   Sodium 138  135 - 145 mEq/L   Potassium 3.5  3.5 - 5.1 mEq/L   Chloride 102  96 - 112 mEq/L   BUN 24 (*) 6 - 23 mg/dL   Creatinine, Ser 1.61 (*) 0.50 - 1.10 mg/dL   Glucose, Bld 096 (*) 70 - 99 mg/dL   Calcium, Ion 0.45  4.09 - 1.30 mmol/L   TCO2 26  0 - 100 mmol/L   Hemoglobin 13.3  12.0 - 15.0 g/dL   HCT 81.1  91.4 - 78.2 %  URINE RAPID DRUG SCREEN (HOSP PERFORMED)     Status: None   Collection Time    05/10/13  9:11 AM      Result Value Range   Opiates NONE DETECTED  NONE DETECTED   Cocaine NONE DETECTED  NONE DETECTED   Benzodiazepines NONE DETECTED  NONE DETECTED   Amphetamines NONE DETECTED  NONE DETECTED   Tetrahydrocannabinol NONE DETECTED  NONE DETECTED   Barbiturates NONE DETECTED  NONE DETECTED   Comment:            DRUG SCREEN FOR MEDICAL PURPOSES     ONLY.  IF CONFIRMATION IS NEEDED     FOR ANY PURPOSE, NOTIFY LAB     WITHIN 5 DAYS.                 LOWEST DETECTABLE LIMITS     FOR URINE DRUG SCREEN  Drug Class       Cutoff (ng/mL)     Amphetamine      1000     Barbiturate      200     Benzodiazepine   200     Tricyclics       300     Opiates          300     Cocaine          300     THC              50  URINALYSIS, ROUTINE W REFLEX MICROSCOPIC     Status: Abnormal   Collection Time    05/10/13  9:11 AM      Result Value Range   Color, Urine RED (*) YELLOW   Comment: BIOCHEMICALS MAY BE AFFECTED BY COLOR   APPearance CLOUDY (*) CLEAR   Specific Gravity, Urine 1.022  1.005 - 1.030   pH 6.0  5.0 - 8.0   Glucose, UA NEGATIVE  NEGATIVE mg/dL   Hgb urine dipstick LARGE (*) NEGATIVE   Bilirubin Urine SMALL (*) NEGATIVE   Ketones, ur 15 (*) NEGATIVE mg/dL   Protein, ur 30 (*) NEGATIVE mg/dL   Urobilinogen, UA 1.0  0.0 - 1.0 mg/dL   Nitrite NEGATIVE  NEGATIVE   Leukocytes, UA MODERATE (*) NEGATIVE  URINE MICROSCOPIC-ADD ON     Status: Abnormal   Collection Time    05/10/13  9:11 AM      Result Value Range   WBC, UA 11-20  <3 WBC/hpf   RBC / HPF TOO NUMEROUS TO COUNT  <3 RBC/hpf   Bacteria, UA MANY (*) RARE  GLUCOSE, CAPILLARY     Status: Abnormal   Collection Time    05/10/13 10:22 AM      Result Value Range   Glucose-Capillary 117 (*) 70 - 99 mg/dL    Ct Head Wo Contrast  05/10/2013   CLINICAL DATA:  Weakness  EXAM: CT HEAD WITHOUT CONTRAST  CT CERVICAL SPINE WITHOUT CONTRAST  TECHNIQUE: Multidetector CT imaging of the head and cervical spine was performed following the standard protocol without intravenous contrast. Multiplanar CT image reconstructions of the cervical spine were also generated.  COMPARISON:  MRI 09/30/2011  FINDINGS: CT HEAD FINDINGS  Generalized atrophy. Chronic microvascular ischemic change in the white matter. Chronic infarct left cerebellum not present previously.  Negative for acute infarct, hemorrhage, or mass lesion. No edema or midline shift. Negative for skull lesion.  CT CERVICAL SPINE FINDINGS   Normal cervical alignment. Negative for fracture or mass lesion. Mild disc and mild facet degeneration.  IMPRESSION: Atrophy and chronic ischemia.  No acute abnormality.  Mild cervical degenerative change.  No acute bony abnormality.   Electronically Signed   By: Marlan Palau M.D.   On: 05/10/2013 09:04   Ct Cervical Spine Wo Contrast  05/10/2013   CLINICAL DATA:  Weakness  EXAM: CT HEAD WITHOUT CONTRAST  CT CERVICAL SPINE WITHOUT CONTRAST  TECHNIQUE: Multidetector CT imaging of the head and cervical spine was performed following the standard protocol without intravenous contrast. Multiplanar CT image reconstructions of the cervical spine were also generated.  COMPARISON:  MRI 09/30/2011  FINDINGS: CT HEAD FINDINGS  Generalized atrophy. Chronic microvascular ischemic change in the white matter. Chronic infarct left cerebellum not present previously.  Negative for acute infarct, hemorrhage, or mass lesion. No edema or midline shift. Negative for skull lesion.  CT CERVICAL SPINE FINDINGS  Normal cervical  alignment. Negative for fracture or mass lesion. Mild disc and mild facet degeneration.  IMPRESSION: Atrophy and chronic ischemia.  No acute abnormality.  Mild cervical degenerative change.  No acute bony abnormality.   Electronically Signed   By: Marlan Palau M.D.   On: 05/10/2013 09:04    Review of Systems  Unable to perform ROS: medical condition  Respiratory: Negative for shortness of breath.   Cardiovascular: Negative for chest pain.  Neurological: Weakness: right-sided.   Blood pressure 119/45, pulse 116, temperature 97.6 F (36.4 C), temperature source Oral, resp. rate 23, height 5\' 4"  (1.626 m), weight 134 lb (60.782 kg), SpO2 94.00%. Physical Exam  Constitutional: She appears well-developed and well-nourished. No distress.  Neck: No JVD present.  Cardiovascular: Normal rate, regular rhythm, normal heart sounds and intact distal pulses.  Exam reveals no gallop and no friction rub.   No  murmur heard. Respiratory: Effort normal and breath sounds normal. No respiratory distress. She has no wheezes. She has no rales.  Musculoskeletal: She exhibits no edema.  Neurological: She is alert. A cranial nerve deficit is present. She exhibits abnormal muscle tone.  Skin: Skin is warm and dry.  Psychiatric: Her speech is slurred. She is inattentive.    Assessment/Plan: Principal Problem:   CVA (cerebral infarction), right hemiplegia and expressive aphasia Active Problems:   Lung cancer   COPD (chronic obstructive pulmonary disease)   Hypertension   Hypercholesterolemia   Peripheral vascular disease, unspecified   Thrombocytopenia   UTI (lower urinary tract infection)   Elevated troponin  Plan: 79 y/o female with metastatic lung cancer, being admitted for what appears to be a CVA. CT is negative for bleed. MRI pending. Pt has expressive aphasia, slight right-sided facial droop and right-sided weakness. She has no known cardiac history. Troponin is slightly elevated at 1.20. She denies any chest pain. EKG is w/o acute change. 2D echo pending. In light of metastatic cancer and possible CVA and no chest pain, would recommend not pursuing any invasive procedures. MD to follow.   Allayne Butcher, PA-C  05/10/2013, 1:31 PM   I have seen & examined the patient along with Ms. Sharol Harness, PA-C.  I agree with her findings, exam & impression/recommendations.  Very unfortunate elderly woman with METS Lung CA p/w SSx c/w CVA (L sided with R side weakness).  No data to suggest CP @ rest or exertion or PND/Orhtopnea/Edema that would suggest CAD.  Troponin checked per protocol was +.  In the absence of ECG changes or Sx to consist Angina / CHF, I am not sure what to make of elevated Troponin levels.  Quite likely related to CVA.  Will review echo when done.  Would not anticoagulate unless cleared by Neurology.    May consider ST at some point, but not acutely unless the Echo is suggestive of  a regional WMA.    Will follow up after echo, but for now would offer supportive care.  No active cardiac issues.  May be reasonable to have on Telemetry since Afib is a potential concern.  Marykay Lex, M.D., M.S. [facility] [address] [place], [place]  [address]  [phone] Pager # [phone] 05/10/2013 5:23 PM    .

## 2013-05-10 NOTE — ED Provider Notes (Signed)
CSN: [id number]     Arrival date & time 05/10/13  4098 History   First MD Initiated Contact with Patient 05/10/13 0827     Chief Complaint  Patient presents with  . Weakness   (Consider location/radiation/quality/duration/timing/severity/associated sxs/prior Treatment) Patient is a 79 y.o. female presenting with weakness. The history is provided by the patient and a relative. No language interpreter was used.  Weakness This is a new problem. Associated symptoms include weakness. Associated symptoms comments: She was found on the floor next to her bed this morning by family. She has had difficulty speaking this morning, aphasic vs confused, found to have marked right sided weakness and facial droop. Last seen normal last night prior to bed (around 9:00 p.m.). No history of strokes. She is currently receiving chemotherapy for primary lung CA, mets to liver with resolution of liver mass on last CT per granddaughter, and new right shoulder mass discovered this week on MR. The patient does not complain of pain. She cannot add to further history..    Past Medical History  Diagnosis Date  . Lung mass   . History of radiation therapy 7/31/212-03/08/2011    right upper lobe lung adenocarcinoma  . COPD (chronic obstructive pulmonary disease)   . Hypertension   . Hypercholesterolemia   . Osteoporosis   . History of chemotherapy     carboplatin/paclitaxel  . lung ca July 2012    , right upper lobe  . Liver metastases dx'd 06/16/12   Past Surgical History  Procedure Laterality Date  . Abdominal hysterectomy      s/p for fibroid tumor  . Portacath placement      right ij   . Back surgery     Family History  Problem Relation Age of Onset  . Breast cancer Mother   . Breast cancer Sister     2 sisters   . Ovarian cancer Maternal Aunt   . Breast cancer Maternal Grandmother    History  Substance Use Topics  . Smoking status: Former Smoker -- 1.00 packs/day for 50 years    Types:  Cigarettes    Quit date: 06/21/2006  . Smokeless tobacco: Not on file     Comment: quit 2009  . Alcohol Use: Yes     Comment: occasional   OB History   Grav Para Term Preterm Abortions TAB SAB Ect Mult Living                 Review of Systems  Unable to perform ROS: Acuity of condition  Neurological: Positive for weakness.    Allergies  Aleve and Zyban  Home Medications   Current Outpatient Rx  Name  Route  Sig  Dispense  Refill  . diphenoxylate-atropine (LOMOTIL) 2.5-0.025 MG per tablet   Oral   Take 1 tablet by mouth 4 (four) times daily as needed for diarrhea or loose stools.         . lidocaine-prilocaine (EMLA) cream   Topical   Apply topically as needed. Use as directed   30 g   1   . oxyCODONE-acetaminophen (PERCOCET/ROXICET) 5-325 MG per tablet   Oral   Take 1 tablet by mouth every 4 (four) hours as needed for severe pain.   30 tablet   0   . prochlorperazine (COMPAZINE) 10 MG tablet   Oral   Take 1 tablet (10 mg total) by mouth every 6 (six) hours as needed.   30 tablet   1   . TARCEVA 100 MG  tablet      TAKE 1 TABLET BY MOUTH DAILY. TAKE ON AN EMPTY STOMACH 1 HOUR BEFORE MEALS OR 2 HOURS AFTER   30 tablet   2     Dispense as written.   . traMADol (ULTRAM) 50 MG tablet   Oral   Take 1 tablet (50 mg total) by mouth every 6 (six) hours as needed. Maximum dose= 8 tablets per day   60 tablet   0    BP 119/45  Pulse 116  Temp(Src) 97.6 F (36.4 C) (Oral)  Resp 23  Ht 5\' 4"  (1.626 m)  Wt 134 lb (60.782 kg)  BMI 22.99 kg/m2  SpO2 94% Physical Exam  Constitutional: She appears well-developed and well-nourished.  HENT:  Head: Normocephalic.  Eyes: Pupils are equal, round, and reactive to light.  Neck: Normal range of motion. Neck supple.  Cardiovascular: Normal rate and regular rhythm.   No murmur heard. Pulmonary/Chest: Effort normal and breath sounds normal.  Port in right chest, site unremarkable.   Abdominal: Soft. Bowel sounds are  normal. There is no tenderness. There is no rebound and no guarding.  Musculoskeletal: Normal range of motion. She exhibits no edema.  Mild paracervical tenderness.  Neurological: She is alert.  Right UE and LE weakness and decreased sensation. No tongue deviation. Right facial weakness. Appears confused, also moderately aphasic - cannot form all words, does not complete sentence. Delayed command response.  Skin: Skin is warm and dry. No rash noted.  Psychiatric: She has a normal mood and affect.    ED Course  Procedures (including critical care time) Labs Review Labs Reviewed  CBC - Abnormal; Notable for the following:    WBC 11.5 (*)    Platelets 73 (*)    All other components within normal limits  DIFFERENTIAL - Abnormal; Notable for the following:    Neutrophils Relative % 92 (*)    Neutro Abs 10.5 (*)    Lymphocytes Relative 5 (*)    Lymphs Abs 0.6 (*)    All other components within normal limits  COMPREHENSIVE METABOLIC PANEL - Abnormal; Notable for the following:    Glucose, Bld 119 (*)    BUN 24 (*)    Albumin 2.8 (*)    AST 60 (*)    ALT 57 (*)    Alkaline Phosphatase 177 (*)    Total Bilirubin 1.4 (*)    GFR calc non Af Amer 55 (*)    GFR calc Af Amer 64 (*)    All other components within normal limits  TROPONIN I - Abnormal; Notable for the following:    Troponin I 1.84 (*)    All other components within normal limits  URINALYSIS, ROUTINE W REFLEX MICROSCOPIC - Abnormal; Notable for the following:    Color, Urine RED (*)    APPearance CLOUDY (*)    Hgb urine dipstick LARGE (*)    Bilirubin Urine SMALL (*)    Ketones, ur 15 (*)    Protein, ur 30 (*)    Leukocytes, UA MODERATE (*)    All other components within normal limits  URINE MICROSCOPIC-ADD ON - Abnormal; Notable for the following:    Bacteria, UA MANY (*)    All other components within normal limits  GLUCOSE, CAPILLARY - Abnormal; Notable for the following:    Glucose-Capillary 117 (*)    All other  components within normal limits  POCT I-STAT, CHEM 8 - Abnormal; Notable for the following:    BUN 24 (*)  Creatinine, Ser 1.20 (*)    Glucose, Bld 118 (*)    All other components within normal limits  POCT I-STAT TROPONIN I - Abnormal; Notable for the following:    Troponin i, poc 1.36 (*)    All other components within normal limits  URINE CULTURE  ETHANOL  PROTIME-INR  APTT  URINE RAPID DRUG SCREEN (HOSP PERFORMED)  CK   Results for orders placed during the hospital encounter of 05/10/13  ETHANOL      Result Value Range   Alcohol, Ethyl (B) <11  0 - 11 mg/dL  PROTIME-INR      Result Value Range   Prothrombin Time 15.1  11.6 - 15.2 seconds   INR 1.22  0.00 - 1.49  APTT      Result Value Range   aPTT 27  24 - 37 seconds  CBC      Result Value Range   WBC 11.5 (*) 4.0 - 10.5 K/uL   RBC 4.38  3.87 - 5.11 MIL/uL   Hemoglobin 12.6  12.0 - 15.0 g/dL   HCT 56.2  13.0 - 86.5 %   MCV 87.4  78.0 - 100.0 fL   MCH 28.8  26.0 - 34.0 pg   MCHC 32.9  30.0 - 36.0 g/dL   RDW 78.4  69.6 - 29.5 %   Platelets 73 (*) 150 - 400 K/uL  DIFFERENTIAL      Result Value Range   Neutrophils Relative % 92 (*) 43 - 77 %   Neutro Abs 10.5 (*) 1.7 - 7.7 K/uL   Lymphocytes Relative 5 (*) 12 - 46 %   Lymphs Abs 0.6 (*) 0.7 - 4.0 K/uL   Monocytes Relative 3  3 - 12 %   Monocytes Absolute 0.4  0.1 - 1.0 K/uL   Eosinophils Relative 0  0 - 5 %   Eosinophils Absolute 0.0  0.0 - 0.7 K/uL   Basophils Relative 0  0 - 1 %   Basophils Absolute 0.0  0.0 - 0.1 K/uL  COMPREHENSIVE METABOLIC PANEL      Result Value Range   Sodium 139  135 - 145 mEq/L   Potassium 3.5  3.5 - 5.1 mEq/L   Chloride 100  96 - 112 mEq/L   CO2 25  19 - 32 mEq/L   Glucose, Bld 119 (*) 70 - 99 mg/dL   BUN 24 (*) 6 - 23 mg/dL   Creatinine, Ser 2.84  0.50 - 1.10 mg/dL   Calcium 9.1  8.4 - 13.2 mg/dL   Total Protein 6.3  6.0 - 8.3 g/dL   Albumin 2.8 (*) 3.5 - 5.2 g/dL   AST 60 (*) 0 - 37 U/L   ALT 57 (*) 0 - 35 U/L   Alkaline  Phosphatase 177 (*) 39 - 117 U/L   Total Bilirubin 1.4 (*) 0.3 - 1.2 mg/dL   GFR calc non Af Amer 55 (*) >90 mL/min   GFR calc Af Amer 64 (*) >90 mL/min  TROPONIN I      Result Value Range   Troponin I 1.84 (*) <0.30 ng/mL  URINE RAPID DRUG SCREEN (HOSP PERFORMED)      Result Value Range   Opiates NONE DETECTED  NONE DETECTED   Cocaine NONE DETECTED  NONE DETECTED   Benzodiazepines NONE DETECTED  NONE DETECTED   Amphetamines NONE DETECTED  NONE DETECTED   Tetrahydrocannabinol NONE DETECTED  NONE DETECTED   Barbiturates NONE DETECTED  NONE DETECTED  URINALYSIS, ROUTINE W REFLEX  MICROSCOPIC      Result Value Range   Color, Urine RED (*) YELLOW   APPearance CLOUDY (*) CLEAR   Specific Gravity, Urine 1.022  1.005 - 1.030   pH 6.0  5.0 - 8.0   Glucose, UA NEGATIVE  NEGATIVE mg/dL   Hgb urine dipstick LARGE (*) NEGATIVE   Bilirubin Urine SMALL (*) NEGATIVE   Ketones, ur 15 (*) NEGATIVE mg/dL   Protein, ur 30 (*) NEGATIVE mg/dL   Urobilinogen, UA 1.0  0.0 - 1.0 mg/dL   Nitrite NEGATIVE  NEGATIVE   Leukocytes, UA MODERATE (*) NEGATIVE  URINE MICROSCOPIC-ADD ON      Result Value Range   WBC, UA 11-20  <3 WBC/hpf   RBC / HPF TOO NUMEROUS TO COUNT  <3 RBC/hpf   Bacteria, UA MANY (*) RARE  GLUCOSE, CAPILLARY      Result Value Range   Glucose-Capillary 117 (*) 70 - 99 mg/dL  CK      Result Value Range   Total CK 155  7 - 177 U/L  POCT I-STAT, CHEM 8      Result Value Range   Sodium 138  135 - 145 mEq/L   Potassium 3.5  3.5 - 5.1 mEq/L   Chloride 102  96 - 112 mEq/L   BUN 24 (*) 6 - 23 mg/dL   Creatinine, Ser 8.11 (*) 0.50 - 1.10 mg/dL   Glucose, Bld 914 (*) 70 - 99 mg/dL   Calcium, Ion 7.82  9.56 - 1.30 mmol/L   TCO2 26  0 - 100 mmol/L   Hemoglobin 13.3  12.0 - 15.0 g/dL   HCT 21.3  08.6 - 57.8 %  POCT I-STAT TROPONIN I      Result Value Range   Troponin i, poc 1.36 (*) 0.00 - 0.08 ng/mL   Comment NOTIFIED PHYSICIAN     Comment 3            Ct Head Wo  Contrast  05/10/2013   CLINICAL DATA:  Weakness  EXAM: CT HEAD WITHOUT CONTRAST  CT CERVICAL SPINE WITHOUT CONTRAST  TECHNIQUE: Multidetector CT imaging of the head and cervical spine was performed following the standard protocol without intravenous contrast. Multiplanar CT image reconstructions of the cervical spine were also generated.  COMPARISON:  MRI 09/30/2011  FINDINGS: CT HEAD FINDINGS  Generalized atrophy. Chronic microvascular ischemic change in the white matter. Chronic infarct left cerebellum not present previously.  Negative for acute infarct, hemorrhage, or mass lesion. No edema or midline shift. Negative for skull lesion.  CT CERVICAL SPINE FINDINGS  Normal cervical alignment. Negative for fracture or mass lesion. Mild disc and mild facet degeneration.  IMPRESSION: Atrophy and chronic ischemia.  No acute abnormality.  Mild cervical degenerative change.  No acute bony abnormality.   Electronically Signed   By: Marlan Palau M.D.   On: 05/10/2013 09:04   Ct Cervical Spine Wo Contrast  05/10/2013   CLINICAL DATA:  Weakness  EXAM: CT HEAD WITHOUT CONTRAST  CT CERVICAL SPINE WITHOUT CONTRAST  TECHNIQUE: Multidetector CT imaging of the head and cervical spine was performed following the standard protocol without intravenous contrast. Multiplanar CT image reconstructions of the cervical spine were also generated.  COMPARISON:  MRI 09/30/2011  FINDINGS: CT HEAD FINDINGS  Generalized atrophy. Chronic microvascular ischemic change in the white matter. Chronic infarct left cerebellum not present previously.  Negative for acute infarct, hemorrhage, or mass lesion. No edema or midline shift. Negative for skull lesion.  CT CERVICAL SPINE  FINDINGS  Normal cervical alignment. Negative for fracture or mass lesion. Mild disc and mild facet degeneration.  IMPRESSION: Atrophy and chronic ischemia.  No acute abnormality.  Mild cervical degenerative change.  No acute bony abnormality.   Electronically Signed   By:  Marlan Palau M.D.   On: 05/10/2013 09:04   Imaging Review Ct Head Wo Contrast  05/10/2013   CLINICAL DATA:  Weakness  EXAM: CT HEAD WITHOUT CONTRAST  CT CERVICAL SPINE WITHOUT CONTRAST  TECHNIQUE: Multidetector CT imaging of the head and cervical spine was performed following the standard protocol without intravenous contrast. Multiplanar CT image reconstructions of the cervical spine were also generated.  COMPARISON:  MRI 09/30/2011  FINDINGS: CT HEAD FINDINGS  Generalized atrophy. Chronic microvascular ischemic change in the white matter. Chronic infarct left cerebellum not present previously.  Negative for acute infarct, hemorrhage, or mass lesion. No edema or midline shift. Negative for skull lesion.  CT CERVICAL SPINE FINDINGS  Normal cervical alignment. Negative for fracture or mass lesion. Mild disc and mild facet degeneration.  IMPRESSION: Atrophy and chronic ischemia.  No acute abnormality.  Mild cervical degenerative change.  No acute bony abnormality.   Electronically Signed   By: Marlan Palau M.D.   On: 05/10/2013 09:04   Ct Cervical Spine Wo Contrast  05/10/2013   CLINICAL DATA:  Weakness  EXAM: CT HEAD WITHOUT CONTRAST  CT CERVICAL SPINE WITHOUT CONTRAST  TECHNIQUE: Multidetector CT imaging of the head and cervical spine was performed following the standard protocol without intravenous contrast. Multiplanar CT image reconstructions of the cervical spine were also generated.  COMPARISON:  MRI 09/30/2011  FINDINGS: CT HEAD FINDINGS  Generalized atrophy. Chronic microvascular ischemic change in the white matter. Chronic infarct left cerebellum not present previously.  Negative for acute infarct, hemorrhage, or mass lesion. No edema or midline shift. Negative for skull lesion.  CT CERVICAL SPINE FINDINGS  Normal cervical alignment. Negative for fracture or mass lesion. Mild disc and mild facet degeneration.  IMPRESSION: Atrophy and chronic ischemia.  No acute abnormality.  Mild cervical  degenerative change.  No acute bony abnormality.   Electronically Signed   By: Marlan Palau M.D.   On: 05/10/2013 09:04    EKG Interpretation    Date/Time:  Thursday May 10 2013 08:32:58 EST Ventricular Rate:  112 PR Interval:  103 QRS Duration: 72 QT Interval:  325 QTC Calculation: 444 R Axis:   83 Text Interpretation:  Sinus tachycardia with irregular rate Borderline right axis deviation No significant change since last tracing Confirmed by KNAPP  MD-J, JON ([id number]) on 05/10/2013 8:41:10 AM            MDM   1. CVA (cerebral vascular accident)   2. UTI (lower urinary tract infection)   3. Elevated troponin   4. Thrombocytopenia    1. CVA 2. Elevated troponin 3. UTI  Re-evaluation: She continues to be confused, aphasic with right sided weakness. No change since arrival.   Discussed with neurology who will consult on the patient. She is stable at this time. Discussed with Dr. Lanell Matar office [facility]) who reports, per Dr. Jacky Kindle, patient to be admitted by hospitalist. Discussed with [facility] - to admit.   med  Arnoldo Hooker, PA-C 05/10/13 1413

## 2013-05-10 NOTE — ED Notes (Addendum)
Per [facility], pt found on floor in her bedroom at home by grand daughter. Hx of lung CA, liver CA and tumor recently dx to right shoulder. Per EMS pt c/o neck and back pain. In route noticed right sided weakness and slurred speech with no feeling to right side. Last known normal at 2145 last night. Pt is awake and confused. Pt has a portacath to right ACW and a 20g to LAC NSL. ST on monitor at 125. Lives with spouse and grand-daughter. Had an episode of diarrhea on Tuesday and had soiled herself which she was unaware of.

## 2013-05-10 NOTE — ED Notes (Signed)
Pt returned from CT °

## 2013-05-10 NOTE — ED Notes (Signed)
Floor report given to RN May.

## 2013-05-10 NOTE — Plan of Care (Signed)
Unable to complete admission assessment because pt is alert and oriented x 1.

## 2013-05-10 NOTE — ED Notes (Signed)
Pt returned from MRI °

## 2013-05-11 ENCOUNTER — Telehealth: Payer: Self-pay | Admitting: Medical Oncology

## 2013-05-11 DIAGNOSIS — J449 Chronic obstructive pulmonary disease, unspecified: Secondary | ICD-10-CM

## 2013-05-11 DIAGNOSIS — I214 Non-ST elevation (NSTEMI) myocardial infarction: Secondary | ICD-10-CM

## 2013-05-11 DIAGNOSIS — I369 Nonrheumatic tricuspid valve disorder, unspecified: Secondary | ICD-10-CM

## 2013-05-11 LAB — TROPONIN I: Troponin I: 2.8 ng/mL (ref <0.30)

## 2013-05-11 LAB — HEPATIC FUNCTION PANEL
ALT: 51 U/L — ABNORMAL HIGH (ref 0–35)
AST: 57 U/L — ABNORMAL HIGH (ref 0–37)
Albumin: 2.6 g/dL — ABNORMAL LOW (ref 3.5–5.2)
Bilirubin, Direct: 0.3 mg/dL (ref 0.0–0.3)
Total Bilirubin: 1.5 mg/dL — ABNORMAL HIGH (ref 0.3–1.2)

## 2013-05-11 LAB — LIPID PANEL
Cholesterol: 193 mg/dL (ref 0–200)
LDL Cholesterol: 115 mg/dL — ABNORMAL HIGH (ref 0–99)
Total CHOL/HDL Ratio: 3.3 RATIO
VLDL: 20 mg/dL (ref 0–40)

## 2013-05-11 MED ORDER — ATORVASTATIN CALCIUM 10 MG PO TABS
10.0000 mg | ORAL_TABLET | Freq: Every day | ORAL | Status: DC
  Administered 2013-05-11 – 2013-05-15 (×5): 10 mg via ORAL
  Filled 2013-05-11 (×5): qty 1

## 2013-05-11 NOTE — Telephone Encounter (Signed)
Insurance will not auth CT Pelvis , but will auth CT abd. She accepted Ct abd, Will need new order if Dr Arbutus Ped agrees for only CT abd.

## 2013-05-11 NOTE — Progress Notes (Signed)
Patient noted with HR sustaining in the 100s since admission. Donnamarie Poag notified and aware at this time. Order to notfied physician if HR increase to 140s. No other order received. Will continue to monitor.   Sim Boast, RN

## 2013-05-11 NOTE — Evaluation (Signed)
Speech Language Pathology Evaluation Patient Details Name: [name] MRN: [id number] DOB: March 09, 1932 Today's Date: 05/11/2013 Time: 4098-1191 SLP Time Calculation (min): 36 min  Problem List:  Patient Active Problem List   Diagnosis Date Noted  . CVA (cerebral infarction), right hemiplegia and expressive aphasia 05/10/2013  . Thrombocytopenia 05/10/2013  . UTI (lower urinary tract infection) 05/10/2013  . Elevated troponin 05/10/2013  . Peripheral vascular disease, unspecified 12/27/2011  . Cancer   . COPD (chronic obstructive pulmonary disease)   . Hypertension   . Hypercholesterolemia   . Osteoporosis   . Lung cancer 05/04/2011   Past Medical History:  Past Medical History  Diagnosis Date  . Lung mass   . History of radiation therapy 7/31/212-03/08/2011    right upper lobe lung adenocarcinoma  . COPD (chronic obstructive pulmonary disease)   . Hypertension   . Hypercholesterolemia   . Osteoporosis   . History of chemotherapy     carboplatin/paclitaxel  . lung ca July 2012    , right upper lobe  . Liver metastases dx'd 06/16/12   Past Surgical History:  Past Surgical History  Procedure Laterality Date  . Abdominal hysterectomy      s/p for fibroid tumor  . Portacath placement      right ij   . Back surgery     HPI:  79 y.o. female with history of metastatic non-small cell lung cancer, adenocarcinoma with positive EGFR mutation, a liver metastasis,? Right shoulder metastasis, on Tarceva, COPD, hypertension, hypercholesterolemia, former smoker, presented to the ED after she was found down in her room. History was provided by granddaughter and spouse.  Pt was found down on the carpeted floor a few feet away from her bed. No report documented regarding fall, syncope, chest pain or palpitations. She was found to have facial asymmetry, confusion, difficult to understand speech and right-sided weakness. mild transaminitis, tropon.  Since arrival speech had apparently  improved per documentation.  MRI revealed Multiple acute/subacute infarcts throughout the cerebellum bilaterally and slightly larger on the left. Supratentorial bilateral infarcts greater on the left with largest area of acute infarction involving the posterior left operculum region. There is a parasagittal distribution of some of the infarcts involving the frontal-parietal lobe which may indicate a component of watershed infarction. However, with involvement of multiple vascular territories, embolus is of concern.  Pt resides with spouse Elijah Birk who recently had heart valve surgery and is not in position to provide large amount of physical assist per daughter Babette Relic.  Speech evaluation was ordered.   Assessment / Plan / Recommendation Clinical Impression  Pt presents with both expressive and receptive language deficits and motor planning difficulties impairing her basic communication abiilties.  Initiation is impaired and verbal output is full of neologisms with islands of fluent output.   Pt inconsistently aware of errors requiring verbal/visual cues to cease talking to focus on SLP.  Use of phonemic, phrase completion, repetition and written word cues were helpful.  Inconsistent repetition at one word level - improved with pt attending to SLP labial movement.   She was able to inconsistently follow commands at one step level- improving with visual/tactile cues.   Pt will benefit from aggressive SLP to aid in communication abilities to decrease caregiver burden.    Note:  Spouse is very HOH (per daughter Babette Relic) which further impacts pt/spouse communication.  Skilled intervention included providing pt and family with education, establishment of goals, and compensation techniques using teach back for understanding.  Pt has a very  supportive family and recommend CIR if 24/7 supervision can be arranged after dc.      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   Engineer, building services Level: Oriented to person;Oriented to place (difficult to assess due to language difficulty, yes/no ?s for oriented to self, hospital) Attention: Sustained;Focused Focused Attention: Appears intact Sustained Attention: Impaired Memory:  (recognized spouse/daughter) Awareness: Appears intact Behaviors: Perseveration;Impulsive    Comprehension  Auditory Comprehension Overall Auditory Comprehension: Impaired Commands: Impaired One Step Basic Commands: 50-74% accurate Two Step Basic Commands: 50-74% accurate Conversation: Simple Interfering Components: Processing speed EffectiveTechniques: Repetition;Slowed speech;Visual/Gestural cues;Extra processing time Visual Recognition/Discrimination Discrimination: Not tested Reading Comprehension Reading Status: Impaired Word level:  (pt able to match word watch to item with choice of 2) Sentence Level: Not tested Paragraph Level: Not tested Functional Environmental (signs, name badge): Not tested Interfering Components: Right neglect/inattention (? right inattention)    Expression Expression Primary Mode of Expression: Verbal Verbal Expression Overall Verbal Expression: Impaired Initiation: Impaired Automatic Speech: Singing;Counting (attempts, able to count 1-5 with total assist) Level of Generative/Spontaneous Verbalization: Phrase;Word;Sentence (pt needs verbal/visual cues to decrease length of verbalization for  maximal listener understanding ) Repetition: Impaired Level of Impairment: Word level;Phrase level;Sentence level Naming: Impairment Responsive: 26-50% accurate Confrontation: Impaired Convergent: Not tested Divergent: Not tested Other Naming Comments: matched word watch with item  Verbal Errors: Perseveration;Jargon;Aware of errors;Not aware of errors (inconsistent awareness of errors) Pragmatics: No impairment Effective Techniques: Written cues;Phonemic cues;Sentence completion Other Verbal Expression Comments: communication board attempted but pt not scanning fully to right side, able to select pictures appropriately on left - will continue efforts to faciliate communication via board Written Expression Written Expression: Not tested   Oral / Motor Oral Motor/Sensory Function Overall Oral Motor/Sensory Function: Impaired Labial Symmetry: Abnormal symmetry right Labial Strength: Reduced Facial ROM: Reduced right Facial Strength: Reduced Velum:  (sluggish) Mandible: Impaired Motor Speech Overall Motor Speech: Impaired Respiration: Impaired Level of Impairment: Phrase Phonation: Low vocal intensity Articulation: Within functional limitis Intelligibility: Intelligible Motor Planning: Impaired Level of Impairment: Word Motor Speech Errors: Inconsistent;Groping for words;Aware;Unaware (inconsistent awareness)   GO     Donavan Burnet, MS [facility] SLP [phone]

## 2013-05-11 NOTE — Progress Notes (Signed)
Utilization review completed. Illene Sweeting, RN, BSN. 

## 2013-05-11 NOTE — Progress Notes (Signed)
[facility]                                                                                Patient Demographics  [name], is a 79 y.o. female, DOB - 07/18/1931, WJX:[id number]  Admit date - 05/10/2013   Admitting Physician Elease Etienne, MD  Outpatient Primary MD for the patient is [name], MD  LOS - 1   Chief Complaint  Patient presents with  . Weakness       Assessment & Plan   Principal Problem:   CVA (cerebral infarction), right hemiplegia and expressive aphasia Active Problems:   Lung cancer   COPD (chronic obstructive pulmonary disease)   Hypertension   Hypercholesterolemia   Peripheral vascular disease, unspecified   Thrombocytopenia   UTI (lower urinary tract infection)   Elevated troponin  CVA with right hemi-plegia and expressive aphasia  -Neurology consulted and following- -MRI: Multiple acute/subacute infarcts throughout the cerebellum bilaterally and slightly larger on the left. -Pending 2D echocardiogram, carotid doppler, HbA1c -Lipid panel: 193/101/58/115, will need to start statin -Continue aspirin for secondary stroke prevention -PT/OT consulted for eval and treatment  UTI  -Apparently recurrent secondary to diarrhea  -Continue IV Rocephin pending urine culture results   Elevated troponin  -Cardiology following -No reported chest pain. EKG without acute changes.  -Possibly demand ischemia from CVA  -CK total 155 (rhabdo unlikely) -Pending Echo   Thrombocytopenia  -Likely secondary to chemotherapy.  -Will continue to monitor CBC  COPD  -Stable, will continue supplemental O2  Hypertension  -Controlled, on no medications  Hyperlipidemia  -Lipid panel: 193/101/58/115, will need to start statin  Chronic diarrhea  -Secondary to chemotherapy, Tarceva   Lung cancer with liver metastases and? Metastasis to right shoulder.  -Will consult oncology -Continue Tarceva  Code Status: full  Family Communication: Daughter  and husband at beside  Disposition Plan: Admitted  Procedures  None  Consults   Neurology Cardiology Oncology  DVT Prophylaxis  SCDs   Lab Results  Component Value Date   PLT 73* 05/10/2013    Medications  Scheduled Meds: . aspirin  300 mg Rectal Daily   Or  . aspirin  325 mg Oral Daily  . cefTRIAXone (ROCEPHIN)  IV  1 g Intravenous Q24H  . erlotinib  100 mg Oral QAC breakfast   Continuous Infusions:  PRN Meds:.acetaminophen, acetaminophen, diphenoxylate-atropine, oxyCODONE-acetaminophen, traMADol  Antibiotics   Anti-infectives   Start     Dose/Rate Route Frequency Ordered Stop   05/11/13 1000  cefTRIAXone (ROCEPHIN) 1 g in dextrose 5 % 50 mL IVPB     1 g 100 mL/hr over 30 Minutes Intravenous Every 24 hours 05/10/13 1534     05/10/13 1000  cefTRIAXone (ROCEPHIN) 1 g in dextrose 5 % 50 mL IVPB     1 g 100 mL/hr over 30 Minutes Intravenous  Once 05/10/13 0956 05/10/13 1138     Time Spent in minutes   30 minutes  Jazier Mcglamery D.O. on 05/11/2013 at 10:31 AM  Between 7am to 7pm - Pager - [phone]  After 7pm go to [email or web address] - password [email or web address]  And look for the night coverage person covering for me after hours  [facility] Office  [phone]    Subjective:   [name] seen and examined today.   Patient states she is feeling very weak.  She denies any bowel movements this morning.  Patient denies dizziness, chest pain, shortness of breath, abdominal pain, N/V/D/C, new weakness, numbess, tingling.    Objective:   Filed Vitals:   05/10/13 1747 05/10/13 2123 05/11/13 0141 05/11/13 0600  BP: 138/73 134/64 106/54 107/63  Pulse: 110 122 127 110  Temp: 97.6 F (36.4 C) 97.6 F (36.4 C) 97.9 F (36.6 C) 97.3 F (36.3 C)  TempSrc: Oral Oral Oral Oral  Resp: 24 18 14 18   Height:      Weight:      SpO2: 94% 93% 94% 94%    Wt Readings from Last 3 Encounters:  05/10/13 56.7 kg (125 lb)  04/17/13 61.1 kg (134 lb 11.2 oz)  03/20/13  61.417 kg (135 lb 6.4 oz)    Intake/Output Summary (Last 24 hours) at 05/11/13 1031 Last data filed at 05/11/13 0655  Gross per 24 hour  Intake 918.33 ml  Output      0 ml  Net 918.33 ml    Exam  General: Well developed, well nourished, NAD, appears stated age  HEENT: NCAT, PERRLA, EOMI, Anicteic Sclera, mucous membranes moist. No pharyngeal erythema or exudates  Neck: Supple, no JVD, no masses  Cardiovascular: S1 S2 auscultated, no rubs, murmurs or gallops. Regular rate and rhythm with occ PAC or PVC heard  Respiratory: Clear to auscultation bilaterally with equal chest rise  Abdomen: Soft, nontender, nondistended, + bowel sounds  Extremities: warm dry without cyanosis clubbing or edema  Neuro: AAOx3, cranial nerves grossly intact. Strength 5/5 in LUE and LLE.  Strength 0/5 in RUE, 2/5 RLE  Skin: Without rashes exudates or nodules  Psych: Normal affect and demeanor with intact judgement and insight  Data Review   Micro Results No results found for this or any previous visit (from the past 240 hour(s)).  Radiology Reports Dg Chest 2 View  05/11/2013   CLINICAL DATA:  Stroke and right-sided weakness.  EXAM: CHEST  2 VIEW  COMPARISON:  Chest CT 02/15/2013  FINDINGS: Right IJ porta catheter in stable position. Right perihilar mass with surrounding interstitial changes in this patient with treated lung cancer. No recent radiography for direct comparison. Small right pleural effusion which is chronic. Left lung is well-aerated.  IMPRESSION: 1. No active cardiopulmonary disease. 2. Chronic, small right pleural effusion. 3. Treated right lung cancer which is followed by CT.   Electronically Signed   By: Tiburcio Pea M.D.   On: 05/11/2013 05:31   Ct Head Wo Contrast  05/10/2013   CLINICAL DATA:  Weakness  EXAM: CT HEAD WITHOUT CONTRAST  CT CERVICAL SPINE WITHOUT CONTRAST  TECHNIQUE: Multidetector CT imaging of the head and cervical spine was performed following the standard  protocol without intravenous contrast. Multiplanar CT image reconstructions of the cervical spine were also generated.  COMPARISON:  MRI 09/30/2011  FINDINGS: CT HEAD FINDINGS  Generalized atrophy. Chronic microvascular ischemic change in the white matter. Chronic infarct left cerebellum not present previously.  Negative for acute infarct, hemorrhage, or mass lesion. No edema or midline shift. Negative for skull lesion.  CT CERVICAL SPINE FINDINGS  Normal cervical alignment. Negative for fracture or mass lesion. Mild disc and mild facet degeneration.  IMPRESSION: Atrophy and chronic ischemia.  No acute abnormality.  Mild cervical degenerative change.  No acute bony abnormality.   Electronically Signed  By: Marlan Palau M.D.   On: 05/10/2013 09:04   Ct Cervical Spine Wo Contrast  05/10/2013   CLINICAL DATA:  Weakness  EXAM: CT HEAD WITHOUT CONTRAST  CT CERVICAL SPINE WITHOUT CONTRAST  TECHNIQUE: Multidetector CT imaging of the head and cervical spine was performed following the standard protocol without intravenous contrast. Multiplanar CT image reconstructions of the cervical spine were also generated.  COMPARISON:  MRI 09/30/2011  FINDINGS: CT HEAD FINDINGS  Generalized atrophy. Chronic microvascular ischemic change in the white matter. Chronic infarct left cerebellum not present previously.  Negative for acute infarct, hemorrhage, or mass lesion. No edema or midline shift. Negative for skull lesion.  CT CERVICAL SPINE FINDINGS  Normal cervical alignment. Negative for fracture or mass lesion. Mild disc and mild facet degeneration.  IMPRESSION: Atrophy and chronic ischemia.  No acute abnormality.  Mild cervical degenerative change.  No acute bony abnormality.   Electronically Signed   By: Marlan Palau M.D.   On: 05/10/2013 09:04   Mr Maxine Glenn Head Wo Contrast  05/10/2013   CLINICAL DATA:  Weakness.  History of lung cancer.  EXAM: MRI HEAD WITHOUT CONTRAST  MRA HEAD WITHOUT CONTRAST  TECHNIQUE: Multiplanar,  multiecho pulse sequences of the brain and surrounding structures were obtained without intravenous contrast. Angiographic images of the head were obtained using MRA technique without contrast.  COMPARISON:  05/10/2013 CT.  09/30/2011 brain MR.  FINDINGS: MRI HEAD FINDINGS  Multiple acute/subacute infarcts throughout the cerebellum bilaterally and slightly larger on the left. Supratentorial bilateral infarcts greater on the left with largest area of acute infarction involving the posterior left operculum region. There is a parasagittal distribution of some of the infarcts involving the frontal-parietal lobe which may indicate a component of watershed infarction. However, with involvement of multiple vascular territories, embolus is of concern.  No intracranial hemorrhage.  Mild small vessel disease type changes.  Global atrophy without hydrocephalus.  No intracranial mass lesion noted on this unenhanced exam.  MRA HEAD FINDINGS  Ectatic distal vertical cervical segment of the left internal carotid artery. No significant stenosis of either internal carotid artery or carotid terminus.  Aplastic A1 segment right anterior cerebral artery.  No significant stenosis of the M1 segment of the middle cerebral artery on either side.  Decrease number of visualized middle cerebral artery branch vessels. The middle cerebral artery branches which are visualized are narrowed and irregular with narrowing more notable on the left.  Moderate stenosis portions of the A2 segment of the right anterior cerebral artery.  Fetal type contribution to the posterior circulation.  Left vertebral artery is small and ends in a posterior inferior cerebellar artery distribution. Mild to moderate narrowing of portions of the distal left vertebral artery and the left posterior inferior cerebellar artery.  Mild to moderate narrowing of the distal right vertebral artery and proximal basilar artery. Mild narrowing of portions of the right posterior  inferior cerebellar artery.  Non visualization of the anterior inferior cerebellar arteries.  Moderate narrowing superior cerebral artery bilaterally.  There is a bulge along the superior margin of the the basilar tip. This may reflect result of fetal type origin of the posterior cerebral arteries. Small aneurysm at this level is not excluded although felt less likely consideration.  Mild to moderate narrowing distal branches of the posterior cerebral artery bilaterally.  IMPRESSION: Multiple acute/subacute infarcts throughout the cerebellum bilaterally and slightly larger on the left. Supratentorial bilateral infarcts greater on the left with largest area of acute infarction involving the  posterior left operculum region. There is a parasagittal distribution of some of the infarcts involving the frontal-parietal lobe which may indicate a component of watershed infarction. However, with involvement of multiple vascular territories, embolus is of concern.  Intracranial atherosclerotic type changes as noted above most notable involving branch vessels and posterior circulation.  These results were called by telephone at the time of interpretation on 05/10/2013 at 2:27 PM to Dr. Linwood Dibbles , who verbally acknowledged these results.   Electronically Signed   By: Bridgett Larsson M.D.   On: 05/10/2013 14:37   Mr Laqueta Jean ZO Contrast  05/10/2013   CLINICAL DATA:  Weakness.  History of lung cancer.  EXAM: MRI HEAD WITHOUT CONTRAST  MRA HEAD WITHOUT CONTRAST  TECHNIQUE: Multiplanar, multiecho pulse sequences of the brain and surrounding structures were obtained without intravenous contrast. Angiographic images of the head were obtained using MRA technique without contrast.  COMPARISON:  05/10/2013 CT.  09/30/2011 brain MR.  FINDINGS: MRI HEAD FINDINGS  Multiple acute/subacute infarcts throughout the cerebellum bilaterally and slightly larger on the left. Supratentorial bilateral infarcts greater on the left with largest area of  acute infarction involving the posterior left operculum region. There is a parasagittal distribution of some of the infarcts involving the frontal-parietal lobe which may indicate a component of watershed infarction. However, with involvement of multiple vascular territories, embolus is of concern.  No intracranial hemorrhage.  Mild small vessel disease type changes.  Global atrophy without hydrocephalus.  No intracranial mass lesion noted on this unenhanced exam.  MRA HEAD FINDINGS  Ectatic distal vertical cervical segment of the left internal carotid artery. No significant stenosis of either internal carotid artery or carotid terminus.  Aplastic A1 segment right anterior cerebral artery.  No significant stenosis of the M1 segment of the middle cerebral artery on either side.  Decrease number of visualized middle cerebral artery branch vessels. The middle cerebral artery branches which are visualized are narrowed and irregular with narrowing more notable on the left.  Moderate stenosis portions of the A2 segment of the right anterior cerebral artery.  Fetal type contribution to the posterior circulation.  Left vertebral artery is small and ends in a posterior inferior cerebellar artery distribution. Mild to moderate narrowing of portions of the distal left vertebral artery and the left posterior inferior cerebellar artery.  Mild to moderate narrowing of the distal right vertebral artery and proximal basilar artery. Mild narrowing of portions of the right posterior inferior cerebellar artery.  Non visualization of the anterior inferior cerebellar arteries.  Moderate narrowing superior cerebral artery bilaterally.  There is a bulge along the superior margin of the the basilar tip. This may reflect result of fetal type origin of the posterior cerebral arteries. Small aneurysm at this level is not excluded although felt less likely consideration.  Mild to moderate narrowing distal branches of the posterior cerebral  artery bilaterally.  IMPRESSION: Multiple acute/subacute infarcts throughout the cerebellum bilaterally and slightly larger on the left. Supratentorial bilateral infarcts greater on the left with largest area of acute infarction involving the posterior left operculum region. There is a parasagittal distribution of some of the infarcts involving the frontal-parietal lobe which may indicate a component of watershed infarction. However, with involvement of multiple vascular territories, embolus is of concern.  Intracranial atherosclerotic type changes as noted above most notable involving branch vessels and posterior circulation.  These results were called by telephone at the time of interpretation on 05/10/2013 at 2:27 PM to Dr. Linwood Dibbles ,  who verbally acknowledged these results.   Electronically Signed   By: Bridgett Larsson M.D.   On: 05/10/2013 14:37    CBC  Recent Labs Lab 05/10/13 0858 05/10/13 0908  WBC 11.5*  --   HGB 12.6 13.3  HCT 38.3 39.0  PLT 73*  --   MCV 87.4  --   MCH 28.8  --   MCHC 32.9  --   RDW 14.2  --   LYMPHSABS 0.6*  --   MONOABS 0.4  --   EOSABS 0.0  --   BASOSABS 0.0  --     Chemistries   Recent Labs Lab 05/10/13 0858 05/10/13 0908 05/11/13 0348  NA 139 138  --   K 3.5 3.5  --   CL 100 102  --   CO2 25  --   --   GLUCOSE 119* 118*  --   BUN 24* 24*  --   CREATININE 0.96 1.20*  --   CALCIUM 9.1  --   --   AST 60*  --  57*  ALT 57*  --  51*  ALKPHOS 177*  --  169*  BILITOT 1.4*  --  1.5*   ------------------------------------------------------------------------------------------------------------------ estimated creatinine clearance is 33.4 ml/min (by C-G formula based on Cr of 1.2). ------------------------------------------------------------------------------------------------------------------ No results found for this basename: HGBA1C,  in the last 72  hours ------------------------------------------------------------------------------------------------------------------  Recent Labs  05/11/13 0348  CHOL 193  HDL 58  LDLCALC 115*  TRIG 101  CHOLHDL 3.3   ------------------------------------------------------------------------------------------------------------------ No results found for this basename: TSH, T4TOTAL, FREET3, T3FREE, THYROIDAB,  in the last 72 hours ------------------------------------------------------------------------------------------------------------------ No results found for this basename: VITAMINB12, FOLATE, FERRITIN, TIBC, IRON, RETICCTPCT,  in the last 72 hours  Coagulation profile  Recent Labs Lab 05/10/13 0858  INR 1.22    No results found for this basename: DDIMER,  in the last 72 hours  Cardiac Enzymes  Recent Labs Lab 05/10/13 0858 05/10/13 1524 05/10/13 2210  TROPONINI 1.84* 7.51* 7.55*   ------------------------------------------------------------------------------------------------------------------ No components found with this basename: POCBNP,

## 2013-05-11 NOTE — Progress Notes (Signed)
Patient was seen and cardio embolic stroke was discussed with both patient and family at bedside.  Antiplatelet therapy was discussed with Dr. Pearlean Brownie (stroke MD) and he recommended keeping her on ASA at this time.  Stroke team to follow in AM.   Felicie Morn PA-C [facility] [phone]  05/11/2013, 3:31 PM

## 2013-05-11 NOTE — Progress Notes (Signed)
Echo Lab  2D Echocardiogram completed.  Noya Santarelli L Armonii Sieh, RDCS 05/11/2013 12:57 PM

## 2013-05-11 NOTE — Progress Notes (Signed)
PT Cancellation Note  Patient Details Name: YVONDA FOUTY MRN: [id number] DOB: [birth year]   Cancelled Treatment:    Reason Eval/Treat Not Completed: Patient not medically ready, Troponins currently trending upward, not appropriate until evidence of downward trend. Will hold therapies at this time.   Fabio Asa 05/11/2013, 10:02 AM

## 2013-05-11 NOTE — Progress Notes (Signed)
Subjective: Alert and cooperative.  Objective: Vital signs in last 24 hours: Temp:  [97.3 F (36.3 C)-98.2 F (36.8 C)] 97.3 F (36.3 C) (11/21 0600) Pulse Rate:  [103-127] 110 (11/21 0600) Resp:  [14-30] 18 (11/21 0600) BP: (89-138)/(44-78) 107/63 mmHg (11/21 0600) SpO2:  [93 %-97 %] 94 % (11/21 0600) Weight:  [125 lb (56.7 kg)] 125 lb (56.7 kg) (11/20 1515) Last BM Date: 05/09/13  Intake/Output from previous day: 11/20 0701 - 11/21 0700 In: 918.3 [P.O.:360; I.V.:558.3] Out: -  Intake/Output this shift:    Medications Current Facility-Administered Medications  Medication Dose Route Frequency Provider Last Rate Last Dose  . acetaminophen (TYLENOL) tablet 650 mg  650 mg Oral Q4H PRN Elease Etienne, MD       Or  . acetaminophen (TYLENOL) suppository 650 mg  650 mg Rectal Q4H PRN Elease Etienne, MD      . aspirin suppository 300 mg  300 mg Rectal Daily Elease Etienne, MD   300 mg at 05/10/13 1740   Or  . aspirin tablet 325 mg  325 mg Oral Daily Elease Etienne, MD      . cefTRIAXone (ROCEPHIN) 1 g in dextrose 5 % 50 mL IVPB  1 g Intravenous Q24H Elease Etienne, MD      . diphenoxylate-atropine (LOMOTIL) 2.5-0.025 MG per tablet 1 tablet  1 tablet Oral QID PRN Elease Etienne, MD      . erlotinib (TARCEVA) tablet 100 mg  100 mg Oral QAC breakfast Elease Etienne, MD      . oxyCODONE-acetaminophen (PERCOCET/ROXICET) 5-325 MG per tablet 1 tablet  1 tablet Oral Q8H PRN Elease Etienne, MD   1 tablet at 05/11/13 0248  . traMADol (ULTRAM) tablet 50 mg  50 mg Oral Q6H PRN Elease Etienne, MD        PE: General appearance: alert, cooperative and mild distress Lungs: clear to auscultation bilaterally Heart: regular rate and rhythm and  No MM Extremities: No LEE Pulses: 2+ and symmetric 1+ left PT, 0 left DP, 2+ right DP. Skin: Warm and dry Neurologic: right > left sided weakness.  Expressive aphasia.  Lab Results:   Recent Labs  05/10/13 0858 05/10/13 0908   WBC 11.5*  --   HGB 12.6 13.3  HCT 38.3 39.0  PLT 73*  --    BMET  Recent Labs  05/10/13 0858 05/10/13 0908  NA 139 138  K 3.5 3.5  CL 100 102  CO2 25  --   GLUCOSE 119* 118*  BUN 24* 24*  CREATININE 0.96 1.20*  CALCIUM 9.1  --    PT/INR  Recent Labs  05/10/13 0858  LABPROT 15.1  INR 1.22   Cholesterol  Recent Labs  05/11/13 0348  CHOL 193   Lipid Panel     Component Value Date/Time   CHOL 193 05/11/2013 0348   TRIG 101 05/11/2013 0348   HDL 58 05/11/2013 0348   CHOLHDL 3.3 05/11/2013 0348   VLDL 20 05/11/2013 0348   LDLCALC 115* 05/11/2013 0348   Cardiac Panel (last 3 results)  Recent Labs  05/10/13 0858 05/10/13 1524 05/10/13 2210  CKTOTAL 155  --   --   TROPONINI 1.84* 7.51* 7.55*    Assessment/Plan   Principal Problem:   CVA (cerebral infarction), right hemiplegia and expressive aphasia Active Problems:   Lung cancer   COPD (chronic obstructive pulmonary disease)   Hypertension   Hypercholesterolemia   Peripheral vascular disease, unspecified  Thrombocytopenia   UTI (lower urinary tract infection)   Elevated troponin  Plan:  Troponin has increased to 7.55.  Will continue to trend.  No acute EKG changes. Sinus tach on tele with PACs.  2D echo is pending.   MRA Multiple acute/subacute infarcts throughout the cerebellum bilaterally and slightly larger on the left.  No intracranial hemorrhage.    LOS: 1 day    HAGER, BRYAN 05/11/2013 9:09 AM  I have seen and examined the patient along with Wilburt Finlay, PA.  I have reviewed the chart, notes and new data.  I agree with PA's note.  Key new complaints: hard to get comfortable, maybe some improvement in aphasia Key examination changes: NSR Key new findings / data: multiple strokes in widely scattered territories; no major regional abnormality on echo  PLAN: The degree and pattern of troponin increase suggests true MI, but the amount of injury is small by enzymes and undetectable  by echo. No angina, no ventricular arrhythmia and no ongoing chest pain mark her as a low risk for reinfarction. Synchronous acute MI and multiple territory strokes raises the concern for cardioembolic event. Consider as-yet-unidentified AFib or marantic endocarditis as possible etiologies. Discussed possible indication for TEE to elucidate these possibilities. But I also encouraged the patient and family to consider hard whether they wish to pursue a course of care that is focused more on aggressive intervention (e.g. TEE that could lead to indication for warfarin therapy, etc) versus one focused more on symptom palliation. They will think about this. If they wish, I am available for a TEE on Monday.  Thurmon Fair, MD, [facility], [facility] [phone] 05/11/2013, 7:38 PM

## 2013-05-11 NOTE — Progress Notes (Signed)
*  PRELIMINARY RESULTS* Vascular Ultrasound Carotid Duplex (Doppler) has been completed.  Findings suggest 1-39% internal carotid artery stenosis bilaterally. Vertebral arteries are patent with antegrade flow.  05/11/2013 12:43 PM Gertie Fey, RVT, RDCS, RDMS

## 2013-05-11 NOTE — Telephone Encounter (Signed)
FYI-Pt at [facility]  4N bed 15 - Dr Arbutus Ped notified.

## 2013-05-11 NOTE — Progress Notes (Signed)
I agree with the following treatment note after reviewing documentation.   Jena Tegeler, Brynn OTR/L Pager: [phone] Office: [phone] .   

## 2013-05-12 ENCOUNTER — Other Ambulatory Visit: Payer: Self-pay | Admitting: Internal Medicine

## 2013-05-12 DIAGNOSIS — C3491 Malignant neoplasm of unspecified part of right bronchus or lung: Secondary | ICD-10-CM

## 2013-05-12 DIAGNOSIS — I6350 Cerebral infarction due to unspecified occlusion or stenosis of unspecified cerebral artery: Secondary | ICD-10-CM

## 2013-05-12 LAB — URINE CULTURE: Colony Count: >=100000

## 2013-05-12 MED ORDER — SODIUM CHLORIDE 0.9 % IJ SOLN: 10.0000 mL | INTRAMUSCULAR | Status: DC | PRN

## 2013-05-12 MED ORDER — SODIUM CHLORIDE 0.9 % IV SOLN
INTRAVENOUS | Status: DC
  Administered 2013-05-12 – 2013-05-14 (×2): via INTRAVENOUS

## 2013-05-12 NOTE — Evaluation (Signed)
Occupational Therapy Evaluation Patient Details Name: CHERYL CHAY MRN: [id number] DOB: April 28, 1932 Today's Date: 05/12/2013 Time: 4098-1191 OT Time Calculation (min): 35 min  OT Assessment / Plan / Recommendation History of present illness VEENA STURGESS is a 79 y.o. female who has known lung and liver cancer. 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 Patient is followed by Dr. Arbutus Ped as out patient . Family noted patient to be normal last night but this am when family went to check on her they were unable to open the bedroom due to patient behind door and not able to understand them. On consultation patient is globally aphasic, alert and shows a right facial droop and arm>leg weakness. Patient was on no ASA prior to hospitalization.    Clinical Impression   Pt presents with below problem list. Pt independent with ADLs, PTA. Pt will benefit from acute OT to increase independence prior to d/c. Recommending CIR for additional rehab prior to d/c home. Pt has good family support. Pt appears to demonstrate motor apraxia during session.     OT Assessment  Patient needs continued OT Services    Follow Up Recommendations  CIR;Supervision/Assistance - 24 hour    Barriers to Discharge      Equipment Recommendations  Other (comment) (defer to next venue)    Recommendations for Other Services Rehab consult  Frequency  Min 2X/week    Precautions / Restrictions Precautions Precautions: Fall Restrictions Weight Bearing Restrictions: No   Pertinent Vitals/Pain Pain in right shoulder. Unable to quantify.     ADL  Eating/Feeding: Minimal assistance Where Assessed - Eating/Feeding: Chair Grooming: Brushing hair;Wash/dry face;Moderate assistance Where Assessed - Grooming: Supported sitting Upper Body Bathing: Moderate assistance Where Assessed - Upper Body Bathing: Supported sitting Lower Body Bathing: +2 Total  assistance Lower Body Bathing: Patient Percentage: 10% Where Assessed - Lower Body Bathing: Supported sit to stand Upper Body Dressing: Maximal assistance Where Assessed - Upper Body Dressing: Supported sitting Lower Body Dressing: +2 Total assistance Lower Body Dressing: Patient Percentage: 0% Where Assessed - Lower Body Dressing: Supported sit to Pharmacist, hospital: +2 Total assistance Toilet Transfer: Patient Percentage: 60% Toilet Transfer Method: Sit to stand;Stand pivot Acupuncturist: Materials engineer and Hygiene: +2 Total assistance Toileting - Clothing Manipulation and Hygiene: Patient Percentage: 0% Where Assessed - Glass blower/designer Manipulation and Hygiene: Sit to stand from 3-in-1 or toilet Tub/Shower Transfer Method: Not assessed Equipment Used: Gait belt Transfers/Ambulation Related to ADLs: +2 total A (60%) for stand pivot and sit to stand transfer with subsequent attempts requiring Mod/Max A.  ADL Comments: OT performed hand over hand assist to incorporate Rt hand into functional tasks (washing face and brushing hair). Educated family to have Rt arm supported on pillow. Family educated to try to assist pt in using RUE into functional tasks.    OT Diagnosis: Hemiplegia dominant side  OT Problem List: Decreased strength;Decreased coordination;Impaired balance (sitting and/or standing);Decreased activity tolerance;Decreased knowledge of use of DME or AE;Decreased knowledge of precautions;Pain;Impaired UE functional use OT Treatment Interventions: Self-care/ADL training;Neuromuscular education;Therapeutic exercise;DME and/or AE instruction;Therapeutic activities;Cognitive remediation/compensation;Visual/perceptual remediation/compensation;Patient/family education;Balance training   OT Goals(Current goals can be found in the care plan section) Acute Rehab OT Goals Patient Stated Goal: none stated OT Goal Formulation: With  patient/family Time For Goal Achievement: 04/28/2013 Potential to Achieve Goals: Good ADL Goals Pt Will Perform Grooming: with supervision;sitting;with set-up Pt Will Perform Upper  Body Dressing: sitting;with min assist Pt Will Transfer to Toilet: with min assist;stand pivot transfer;bedside commode Pt Will Perform Toileting - Clothing Manipulation and hygiene: with min assist;sitting/lateral leans;sit to/from stand Additional ADL Goal #1: Pt will perform bed mobility at Min A level as precursors for ADLs.  Additional ADL Goal #2: Pt will sit EOB and maintain sitting balance for 5 minutes during functional task with Min guard assist  Visit Information  Last OT Received On: 05/12/13 Assistance Needed: +2 PT/OT Co-Evaluation/Treatment: Yes History of Present Illness: LEONTYNE MANVILLE is a 79 y.o. female who has known lung and liver cancer. 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 Patient is followed by Dr. Arbutus Ped as out patient . Family noted patient to be normal last night but this am when family went to check on her they were unable to open the bedroom due to patient behind door and not able to understand them. On consultation patient is globally aphasic, alert and shows a right facial droop and arm>leg weakness. Patient was on no ASA prior to hospitalization.        Prior Functioning     Home Living Family/patient expects to be discharged to:: Private residence Living Arrangements: Spouse/significant other;Other relatives Available Help at Discharge: Family;Available 24 hours/day Type of Home: House (multilevel hosue per pt's daughter, basement and upstairs) Home Access: Stairs to enter [employer] of Steps: 1 Entrance Stairs-Rails: None Home Layout: Multi-level Alternate Level Stairs-Number of Steps: 12 Alternate Level Stairs-Rails: Right Home Equipment: None  Lives With: Spouse Prior Function Level of  Independence: Independent Communication Communication: Receptive difficulties;Expressive difficulties Dominant Hand: Right         Vision/Perception     Cognition  Cognition Arousal/Alertness: Awake/alert (initally lethargic but maintain arousal with activity) Behavior During Therapy: WFL for tasks assessed/performed Overall Cognitive Status: Impaired/Different from baseline Area of Impairment: Following commands Following Commands: Follows one step commands inconsistently;Follows multi-step commands inconsistently (multimodal cues to assess activities) General Comments: Pt with expressive aphasia making it hard to determine higher level cognition at this time    Extremity/Trunk Assessment Upper Extremity Assessment Upper Extremity Assessment: RUE deficits/detail;Difficult to assess due to impaired cognition RUE Coordination: decreased fine motor;decreased gross motor Lower Extremity Assessment Lower Extremity Assessment: Defer to PT evaluation RLE Deficits / Details: weakness vs motor apraxia RLE Coordination: decreased fine motor;decreased gross motor LLE Deficits / Details: generalized weakness     Mobility Bed Mobility Bed Mobility: Supine to Sit;Sitting - Scoot to Edge of Bed Supine to Sit: 1: +2 Total assist;HOB flat Supine to Sit: Patient Percentage: 40% Sitting - Scoot to Edge of Bed: 3: Mod assist Details for Bed Mobility Assistance: Assist to elevate to sitting, vocalizations of pain during transition, patient with poor trunk control requiring assist to come to EOB and for stability, use of chuck pad to reach EOB Transfers Transfers: Sit to Stand;Stand to Sit Sit to Stand: 1: +2 Total assist;From bed;From chair/3-in-1 Sit to Stand: Patient Percentage: 60% Stand to Sit: 3: Mod assist Details for Transfer Assistance: Assist for elevation to stand, one person face to face to elevate, second person to help pivot hips around to reach [facility] safely and place hands.  Pt able  to initiate push to stand with support but unable to maintain upright. Initially bilateral support to stand (+2), on subsequent attempts mod to max (+1) in face to face position.  Balance Balance Balance Assessed: Yes Static Sitting Balance Static Sitting - Balance Support: Feet supported Static Sitting - Level of Assistance: 3: Mod assist Static Sitting - Comment/# of Minutes:   EOB with lateral lean to the right. While supported sitting on BSC with pillows positioned on the Right patient was able to sit min guard/standby.   Static Standing Balance Static Standing - Balance Support: During functional activity Static Standing - Level of Assistance: 2: Max assist Static Standing - Comment/# of Minutes:   40 secondas static standing with max assist and RLE blocking. Patient was able to maintain weight bearing with max assist from face to face position.   Dynamic Standing Balance Dynamic Standing - Balance Support: During functional activity Dynamic Standing - Level of Assistance: 2: Max assist Dynamic Standing - Balance Activities: Lateral lean/weight shifting Dynamic Standing - Comments: patient able to laterally lean during step initiation while in standing position with max support from face to face. RLE block to control for buckling.    End of Session OT - End of Session Equipment Utilized During Treatment: Gait belt Activity Tolerance: Patient limited by fatigue Patient left: in chair;with call bell/phone within reach;Other (comment) (with PT)  GO     Earlie Raveling OTR/L [phone] 05/12/2013, 4:30 PM

## 2013-05-12 NOTE — Progress Notes (Signed)
[facility]                                                                                Patient Demographics  [name], is a 79 y.o. female, DOB - Jun 02, 1932, ZOX:[id number]  Admit date - 05/10/2013   Admitting Physician Elease Etienne, MD  Outpatient Primary MD for the patient is [name], MD  LOS - 2   Chief Complaint  Patient presents with  . Weakness       Assessment & Plan   Principal Problem:   CVA (cerebral infarction), right hemiplegia and expressive aphasia Active Problems:   Lung cancer   COPD (chronic obstructive pulmonary disease)   Hypertension   Hypercholesterolemia   Peripheral vascular disease, unspecified   Thrombocytopenia   UTI (lower urinary tract infection)   Elevated troponin  CVA with right hemi-plegia and expressive aphasia  -Neurology consulted and following- -MRI: Multiple acute/subacute infarcts throughout the cerebellum bilaterally and slightly larger on the left. -Echo: EF 55-60% - no cardiac source of emboli identified -Carotid Doppler: Findings suggest 1-39% internal carotid artery stenosis bilaterally. Vertebral arteries are patent with antegrade flow.  -Lipid panel: 193/101/58/115, Continue statin  -Continue aspirin for secondary stroke prevention -PT/OT consulted for eval and treatment  UTI  -Apparently recurrent secondary to diarrhea  -Continue IV Rocephin pending urine culture results   Elevated troponin  -Cardiology following -Troponin trending downward.  Likely secondary to CVA. -No reported chest pain. EKG without acute changes.  -CK total 155 (rhabdo unlikely)  Thrombocytopenia  -Likely secondary to chemotherapy.  -Will continue to monitor CBC  COPD  -Stable, will continue supplemental O2  Hypertension  -Controlled, on no medications  Hyperlipidemia  -Lipid panel: 193/101/58/115, Continue statin  Chronic diarrhea  -Secondary to chemotherapy, Tarceva   Lung cancer with liver metastases and?  Metastasis to right shoulder.  -Dr. Arbutus Ped made aware of patient's admission -Continue Tarceva  Code Status: full  Family Communication: Daughter at bedside.  Disposition Plan: Admitted.  Had extensive conversation regarding patient's prognosis, as well as treatment options and plan. Will consult inpatient rehabilitation for possible admission.  Spoke with Dr. Clelia Croft, he will assume care on 05/13/2013.  Procedures  None  Consults   Neurology Cardiology Oncology  DVT Prophylaxis  SCDs   Lab Results  Component Value Date   PLT 73* 05/10/2013    Medications  Scheduled Meds: . aspirin  300 mg Rectal Daily   Or  . aspirin  325 mg Oral Daily  . atorvastatin  10 mg Oral q1800  . cefTRIAXone (ROCEPHIN)  IV  1 g Intravenous Q24H  . erlotinib  100 mg Oral QAC breakfast   Continuous Infusions:  PRN Meds:.acetaminophen, acetaminophen, diphenoxylate-atropine, oxyCODONE-acetaminophen, traMADol  Antibiotics   Anti-infectives   Start     Dose/Rate Route Frequency Ordered Stop   05/11/13 1000  cefTRIAXone (ROCEPHIN) 1 g in dextrose 5 % 50 mL IVPB     1 g 100 mL/hr over 30 Minutes Intravenous Every 24 hours 05/10/13 1534     05/10/13 1000  cefTRIAXone (ROCEPHIN) 1 g in dextrose 5 % 50 mL IVPB     1 g 100 mL/hr over 30 Minutes Intravenous  Once 05/10/13 0956 05/10/13 1138     Time Spent in minutes   45 minutes  Waylen Depaolo D.O. on 05/12/2013 at 11:06 AM  Between 7am to 7pm - Pager - [phone]  After 7pm go to [email or web address] - password [email or web address]  And look for the night coverage person covering for me after hours  [facility] Office  [phone]    Subjective:   [name] seen and examined today.   Patient states she is feeling improved from prior days.  Patient denies dizziness, chest pain, shortness of breath, abdominal pain, N/V/D/C, new weakness, numbess, tingling.    Objective:   Filed Vitals:   05/11/13 2140 05/12/13 0214 05/12/13 0700 05/12/13  0935  BP: 120/69 124/65 120/60 111/55  Pulse: 117 119 103 120  Temp: 98.7 F (37.1 C) 97.5 F (36.4 C) 97.6 F (36.4 C) 98 F (36.7 C)  TempSrc: Oral Oral Oral Oral  Resp: 18 18 20 20   Height:      Weight:      SpO2: 95% 97% 94% 93%    Wt Readings from Last 3 Encounters:  05/10/13 56.7 kg (125 lb)  04/17/13 61.1 kg (134 lb 11.2 oz)  03/20/13 61.417 kg (135 lb 6.4 oz)    Intake/Output Summary (Last 24 hours) at 05/12/13 1106 Last data filed at 05/12/13 0816  Gross per 24 hour  Intake    230 ml  Output    150 ml  Net     80 ml    Exam  General: Well developed, well nourished, NAD, appears stated age  HEENT: NCAT, PERRLA, EOMI, Anicteic Sclera, mucous membranes moist. No pharyngeal erythema or exudates  Neck: Supple, no JVD, no masses  Cardiovascular: S1 S2 auscultated, no rubs, murmurs or gallops. Regular rate and rhythm with occ PAC or PVC heard  Respiratory: Clear to auscultation bilaterally with equal chest rise  Abdomen: Soft, nontender, nondistended, + bowel sounds  Extremities: warm dry without cyanosis clubbing or edema  Neuro: AAOx3, cranial nerves grossly intact. Strength 5/5 in LUE and LLE.  Strength 0/5 in RUE, 2/5 RLE  Skin: Without rashes exudates or nodules  Psych: Normal affect and demeanor with intact judgement and insight  Data Review   Micro Results Recent Results (from the past 240 hour(s))  URINE CULTURE     Status: None   Collection Time    05/10/13  9:11 AM      Result Value Range Status   Specimen Description URINE, CLEAN CATCH   Final   Special Requests NONE   Final   Culture  Setup Time     Final   Value: 05/10/2013 10:10     Performed at [facility] Count     Final   Value: >=100,000 COLONIES/ML     Performed at [facility]   Culture     Final   Value: ESCHERICHIA COLI     Performed at [facility]   Report Status 05/12/2013 FINAL   Final   Organism ID, Bacteria ESCHERICHIA COLI   Final     Radiology Reports Dg Chest 2 View  05/11/2013   CLINICAL DATA:  Stroke and right-sided weakness.  EXAM: CHEST  2 VIEW  COMPARISON:  Chest CT 02/15/2013  FINDINGS: Right IJ porta catheter in stable position. Right perihilar mass with surrounding interstitial changes in this patient with treated lung cancer. No recent radiography for direct comparison. Small right pleural effusion which is chronic. Left lung is  well-aerated.  IMPRESSION: 1. No active cardiopulmonary disease. 2. Chronic, small right pleural effusion. 3. Treated right lung cancer which is followed by CT.   Electronically Signed   By: Tiburcio Pea M.D.   On: 05/11/2013 05:31   Ct Head Wo Contrast  05/10/2013   CLINICAL DATA:  Weakness  EXAM: CT HEAD WITHOUT CONTRAST  CT CERVICAL SPINE WITHOUT CONTRAST  TECHNIQUE: Multidetector CT imaging of the head and cervical spine was performed following the standard protocol without intravenous contrast. Multiplanar CT image reconstructions of the cervical spine were also generated.  COMPARISON:  MRI 09/30/2011  FINDINGS: CT HEAD FINDINGS  Generalized atrophy. Chronic microvascular ischemic change in the white matter. Chronic infarct left cerebellum not present previously.  Negative for acute infarct, hemorrhage, or mass lesion. No edema or midline shift. Negative for skull lesion.  CT CERVICAL SPINE FINDINGS  Normal cervical alignment. Negative for fracture or mass lesion. Mild disc and mild facet degeneration.  IMPRESSION: Atrophy and chronic ischemia.  No acute abnormality.  Mild cervical degenerative change.  No acute bony abnormality.   Electronically Signed   By: Marlan Palau M.D.   On: 05/10/2013 09:04   Ct Cervical Spine Wo Contrast  05/10/2013   CLINICAL DATA:  Weakness  EXAM: CT HEAD WITHOUT CONTRAST  CT CERVICAL SPINE WITHOUT CONTRAST  TECHNIQUE: Multidetector CT imaging of the head and cervical spine was performed following the standard protocol without intravenous contrast.  Multiplanar CT image reconstructions of the cervical spine were also generated.  COMPARISON:  MRI 09/30/2011  FINDINGS: CT HEAD FINDINGS  Generalized atrophy. Chronic microvascular ischemic change in the white matter. Chronic infarct left cerebellum not present previously.  Negative for acute infarct, hemorrhage, or mass lesion. No edema or midline shift. Negative for skull lesion.  CT CERVICAL SPINE FINDINGS  Normal cervical alignment. Negative for fracture or mass lesion. Mild disc and mild facet degeneration.  IMPRESSION: Atrophy and chronic ischemia.  No acute abnormality.  Mild cervical degenerative change.  No acute bony abnormality.   Electronically Signed   By: Marlan Palau M.D.   On: 05/10/2013 09:04   Mr Maxine Glenn Head Wo Contrast  05/10/2013   CLINICAL DATA:  Weakness.  History of lung cancer.  EXAM: MRI HEAD WITHOUT CONTRAST  MRA HEAD WITHOUT CONTRAST  TECHNIQUE: Multiplanar, multiecho pulse sequences of the brain and surrounding structures were obtained without intravenous contrast. Angiographic images of the head were obtained using MRA technique without contrast.  COMPARISON:  05/10/2013 CT.  09/30/2011 brain MR.  FINDINGS: MRI HEAD FINDINGS  Multiple acute/subacute infarcts throughout the cerebellum bilaterally and slightly larger on the left. Supratentorial bilateral infarcts greater on the left with largest area of acute infarction involving the posterior left operculum region. There is a parasagittal distribution of some of the infarcts involving the frontal-parietal lobe which may indicate a component of watershed infarction. However, with involvement of multiple vascular territories, embolus is of concern.  No intracranial hemorrhage.  Mild small vessel disease type changes.  Global atrophy without hydrocephalus.  No intracranial mass lesion noted on this unenhanced exam.  MRA HEAD FINDINGS  Ectatic distal vertical cervical segment of the left internal carotid artery. No significant stenosis of  either internal carotid artery or carotid terminus.  Aplastic A1 segment right anterior cerebral artery.  No significant stenosis of the M1 segment of the middle cerebral artery on either side.  Decrease number of visualized middle cerebral artery branch vessels. The middle cerebral artery branches which are visualized are narrowed  and irregular with narrowing more notable on the left.  Moderate stenosis portions of the A2 segment of the right anterior cerebral artery.  Fetal type contribution to the posterior circulation.  Left vertebral artery is small and ends in a posterior inferior cerebellar artery distribution. Mild to moderate narrowing of portions of the distal left vertebral artery and the left posterior inferior cerebellar artery.  Mild to moderate narrowing of the distal right vertebral artery and proximal basilar artery. Mild narrowing of portions of the right posterior inferior cerebellar artery.  Non visualization of the anterior inferior cerebellar arteries.  Moderate narrowing superior cerebral artery bilaterally.  There is a bulge along the superior margin of the the basilar tip. This may reflect result of fetal type origin of the posterior cerebral arteries. Small aneurysm at this level is not excluded although felt less likely consideration.  Mild to moderate narrowing distal branches of the posterior cerebral artery bilaterally.  IMPRESSION: Multiple acute/subacute infarcts throughout the cerebellum bilaterally and slightly larger on the left. Supratentorial bilateral infarcts greater on the left with largest area of acute infarction involving the posterior left operculum region. There is a parasagittal distribution of some of the infarcts involving the frontal-parietal lobe which may indicate a component of watershed infarction. However, with involvement of multiple vascular territories, embolus is of concern.  Intracranial atherosclerotic type changes as noted above most notable involving  branch vessels and posterior circulation.  These results were called by telephone at the time of interpretation on 05/10/2013 at 2:27 PM to Dr. Linwood Dibbles , who verbally acknowledged these results.   Electronically Signed   By: Bridgett Larsson M.D.   On: 05/10/2013 14:37   Mr Laqueta Jean RU Contrast  05/10/2013   CLINICAL DATA:  Weakness.  History of lung cancer.  EXAM: MRI HEAD WITHOUT CONTRAST  MRA HEAD WITHOUT CONTRAST  TECHNIQUE: Multiplanar, multiecho pulse sequences of the brain and surrounding structures were obtained without intravenous contrast. Angiographic images of the head were obtained using MRA technique without contrast.  COMPARISON:  05/10/2013 CT.  09/30/2011 brain MR.  FINDINGS: MRI HEAD FINDINGS  Multiple acute/subacute infarcts throughout the cerebellum bilaterally and slightly larger on the left. Supratentorial bilateral infarcts greater on the left with largest area of acute infarction involving the posterior left operculum region. There is a parasagittal distribution of some of the infarcts involving the frontal-parietal lobe which may indicate a component of watershed infarction. However, with involvement of multiple vascular territories, embolus is of concern.  No intracranial hemorrhage.  Mild small vessel disease type changes.  Global atrophy without hydrocephalus.  No intracranial mass lesion noted on this unenhanced exam.  MRA HEAD FINDINGS  Ectatic distal vertical cervical segment of the left internal carotid artery. No significant stenosis of either internal carotid artery or carotid terminus.  Aplastic A1 segment right anterior cerebral artery.  No significant stenosis of the M1 segment of the middle cerebral artery on either side.  Decrease number of visualized middle cerebral artery branch vessels. The middle cerebral artery branches which are visualized are narrowed and irregular with narrowing more notable on the left.  Moderate stenosis portions of the A2 segment of the right  anterior cerebral artery.  Fetal type contribution to the posterior circulation.  Left vertebral artery is small and ends in a posterior inferior cerebellar artery distribution. Mild to moderate narrowing of portions of the distal left vertebral artery and the left posterior inferior cerebellar artery.  Mild to moderate narrowing of the distal right vertebral  artery and proximal basilar artery. Mild narrowing of portions of the right posterior inferior cerebellar artery.  Non visualization of the anterior inferior cerebellar arteries.  Moderate narrowing superior cerebral artery bilaterally.  There is a bulge along the superior margin of the the basilar tip. This may reflect result of fetal type origin of the posterior cerebral arteries. Small aneurysm at this level is not excluded although felt less likely consideration.  Mild to moderate narrowing distal branches of the posterior cerebral artery bilaterally.  IMPRESSION: Multiple acute/subacute infarcts throughout the cerebellum bilaterally and slightly larger on the left. Supratentorial bilateral infarcts greater on the left with largest area of acute infarction involving the posterior left operculum region. There is a parasagittal distribution of some of the infarcts involving the frontal-parietal lobe which may indicate a component of watershed infarction. However, with involvement of multiple vascular territories, embolus is of concern.  Intracranial atherosclerotic type changes as noted above most notable involving branch vessels and posterior circulation.  These results were called by telephone at the time of interpretation on 05/10/2013 at 2:27 PM to Dr. Linwood Dibbles , who verbally acknowledged these results.   Electronically Signed   By: Bridgett Larsson M.D.   On: 05/10/2013 14:37    CBC  Recent Labs Lab 05/10/13 0858 05/10/13 0908  WBC 11.5*  --   HGB 12.6 13.3  HCT 38.3 39.0  PLT 73*  --   MCV 87.4  --   MCH 28.8  --   MCHC 32.9  --   RDW 14.2   --   LYMPHSABS 0.6*  --   MONOABS 0.4  --   EOSABS 0.0  --   BASOSABS 0.0  --     Chemistries   Recent Labs Lab 05/10/13 0858 05/10/13 0908 05/11/13 0348  NA 139 138  --   K 3.5 3.5  --   CL 100 102  --   CO2 25  --   --   GLUCOSE 119* 118*  --   BUN 24* 24*  --   CREATININE 0.96 1.20*  --   CALCIUM 9.1  --   --   AST 60*  --  57*  ALT 57*  --  51*  ALKPHOS 177*  --  169*  BILITOT 1.4*  --  1.5*   ------------------------------------------------------------------------------------------------------------------ estimated creatinine clearance is 33.4 ml/min (by C-G formula based on Cr of 1.2). ------------------------------------------------------------------------------------------------------------------  Recent Labs  05/11/13 0348  HGBA1C 5.4   ------------------------------------------------------------------------------------------------------------------  Recent Labs  05/11/13 0348  CHOL 193  HDL 58  LDLCALC 115*  TRIG 101  CHOLHDL 3.3   ------------------------------------------------------------------------------------------------------------------ No results found for this basename: TSH, T4TOTAL, FREET3, T3FREE, THYROIDAB,  in the last 72 hours ------------------------------------------------------------------------------------------------------------------ No results found for this basename: VITAMINB12, FOLATE, FERRITIN, TIBC, IRON, RETICCTPCT,  in the last 72 hours  Coagulation profile  Recent Labs Lab 05/10/13 0858  INR 1.22    No results found for this basename: DDIMER,  in the last 72 hours  Cardiac Enzymes  Recent Labs Lab 05/10/13 1524 05/10/13 2210 05/11/13 1412  TROPONINI 7.51* 7.55* 2.80*   ------------------------------------------------------------------------------------------------------------------ No components found with this basename: POCBNP,

## 2013-05-12 NOTE — Evaluation (Signed)
Physical Therapy Evaluation Patient Details Name: [name] MRN: [id number] DOB: Mar 03, 1932 Today's Date: 05/12/2013 Time: 4098-1191 PT Time Calculation (min): 43 min  PT Assessment / Plan / Recommendation History of Present Illness  [name] is a 79 y.o. female who has known lung and liver cancer. 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 Patient is followed by Dr. Arbutus Ped as out patient . Family noted patient to be normal last night but this am when family went to check on her they were unable to open the bedroom due to patient behind door and not able to understand them. On consultation patient is globally aphasic, alert and shows a right facial droop and arm>leg weakness. Patient was on no ASA prior to hospitalization.    Clinical Impression  Patient demonstrates deficits in functional mobility as indicated below. Patient will benefit from continued skilled PT to address deficits and maximize function. Patient was very motivated and participatory throughout session. Pt does require increased assist for mobility but demonstrates good potential for recovery.  Spoke with patient and family at length regarding therapeutic positioning and activities to increase motor function. Patient demonstrates significant motor apraxia but with various multi-modal cues and techniques, pt was able to elicit 3+/5 to 4/5 RLE strength.  Pt responded well to load bearing tasks throughout session.  Feel that patients is an ideal candidate for intense and comprehensive inpatient rehabilitation based on her prior level of function, good family support, and participation.  Will continue to see as indicated to progress activity and facilitate d/c to CIR. Pt and family supportive of this plan of care.    PT Assessment  Patient needs continued PT services    Follow Up Recommendations  CIR          Equipment Recommendations   (TBD)     Recommendations for Other Services Rehab consult   Frequency Min 4X/week    Precautions / Restrictions Precautions Precautions: Fall   Pertinent Vitals/Pain Pt reports some pain in R shoulder, unable to quantify      Mobility  Bed Mobility Bed Mobility: Supine to Sit;Sitting - Scoot to Edge of Bed Supine to Sit: 1: +2 Total assist;HOB flat Supine to Sit: Patient Percentage: 40% Sitting - Scoot to Edge of Bed: 3: Mod assist Details for Bed Mobility Assistance: Assist to elevate to sitting, vocalizations of pain during transition, patient with poor trunk control requiring assist to come to EOB and for stability, use of chuck pad to reach EOB Transfers Transfers: Sit to Stand;Stand to Sit;Stand Pivot Transfers Sit to Stand: 1: +2 Total assist;From bed;From chair/3-in-1 Sit to Stand: Patient Percentage: 60% Stand to Sit: 3: Mod assist Stand Pivot Transfers: 1: +2 Total assist Stand Pivot Transfers: Patient Percentage: 60% Details for Transfer Assistance: Assist for elevation to stand, one person face to face to elevate, second person to help pivot hips around to reach [facility] safely and place hands.  Pt able to initiate push to stand with support but unable to maintain upright. Initially bilateral support to stand (+2), on subsequent attempts mod to max (+1) in face to face position. Ambulation/Gait Ambulation/Gait Assistance: Not tested (comment)    Exercises General Exercises - Lower Extremity Ankle Circles/Pumps: AROM;AAROM;Both;5 reps (during strength assessment with multi-modal cues ) Long Arc Quad: AROM;Both;5 reps (multi modal cues to perform during assessment of strength)   PT Diagnosis: Difficulty walking;Hemiplegia dominant side  PT Problem List: Decreased  strength;Decreased range of motion;Decreased activity tolerance;Decreased balance;Decreased mobility;Decreased coordination;Decreased cognition;Decreased knowledge of use of DME PT Treatment Interventions: DME instruction;Gait  training;Functional mobility training;Therapeutic activities;Therapeutic exercise;Balance training;Neuromuscular re-education;Cognitive remediation;Patient/family education     PT Goals(Current goals can be found in the care plan section) Acute Rehab PT Goals Patient Stated Goal: none stated PT Goal Formulation: With patient Time For Goal Achievement: 05/26/13 Potential to Achieve Goals: Good  Visit Information  Last PT Received On: 05/12/13 Assistance Needed: +2 History of Present Illness: [name] is a 79 y.o. female who has known lung and liver cancer. 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 Patient is followed by Dr. Arbutus Ped as out patient . Family noted patient to be normal last night but this am when family went to check on her they were unable to open the bedroom due to patient behind door and not able to understand them. On consultation patient is globally aphasic, alert and shows a right facial droop and arm>leg weakness. Patient was on no ASA prior to hospitalization.        Prior Functioning  Home Living Family/patient expects to be discharged to:: Private residence Living Arrangements: Spouse/significant other;Other relatives Available Help at Discharge: Family;Available 24 hours/day Type of Home: House (multilevel house per pt's daughter, basement and upstairs) Home Access: Stairs to enter Secretary/administrator of Steps: 1 Entrance Stairs-Rails: None Home Layout: Multi-level Alternate Level Stairs-Number of Steps: 12 Alternate Level Stairs-Rails: Right Home Equipment: None  Lives With: Spouse Prior Function Level of Independence: Independent Communication Communication: Receptive difficulties;Expressive difficulties Dominant Hand: Right    Cognition  Cognition Arousal/Alertness: Awake/alert (initially lethargic but maintain arousal with activity) Behavior During Therapy: WFL for tasks  assessed/performed Overall Cognitive Status: Impaired/Different from baseline Area of Impairment: Following commands Following Commands: Follows one step commands inconsistently;Follows multi-step commands inconsistently (multi modal cues to assess activities) General Comments: Pt with expressive aphasia making it hard to determine higher level cognition at this time    Extremity/Trunk Assessment Lower Extremity Assessment Lower Extremity Assessment: RLE deficits/detail;LLE deficits/detail;Difficult to assess due to impaired cognition RLE Deficits / Details: weakness vs motor apraxia RLE Coordination: decreased fine motor;decreased gross motor LLE Deficits / Details: generalized weakness   Balance Static Sitting Balance Static Sitting - Balance Support: Feet supported Static Sitting - Level of Assistance: 3: Mod assist Static Sitting - Comment/# of Minutes: EOB with lateral lean to the right.  While supported sitting on BSC with pillows positioned on the Right patient was able to sit min guard/standby.  Static Standing Balance Static Standing - Balance Support: During functional activity Static Standing - Level of Assistance: 2: Max assist Static Standing - Comment/# of Minutes: 40 secondas static standing with max assist and RLE blocking. Patient was able to maintain weight bearing with max assist from face to face position.  Dynamic Standing Balance Dynamic Standing - Balance Support: During functional activity Dynamic Standing - Level of Assistance: 2: Max assist Dynamic Standing - Balance Activities: Lateral lean/weight shifting Dynamic Standing - Comments: patient able to laterally lean during step initiation while in standing position with max support from face to face. RLE block to control for buckling.   End of Session PT - End of Session Equipment Utilized During Treatment: Gait belt Activity Tolerance: Patient limited by fatigue Patient left: in chair;with call bell/phone within  reach;with family/visitor present Nurse Communication: Mobility status  GP     Fabio Asa  05/12/2013, 4:02 PM Charlotte Crumb, PT DPT  [phone]

## 2013-05-12 NOTE — Progress Notes (Signed)
Stroke Team Progress Note  HISTORY  [name] is a 79 y.o. female who has known lung and liver cancer. 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 Patient is followed by Dr. Arbutus Ped as out patient . Family noted patient to be normal last night but this am when family went to check on her they were unable to open the bedroom due to patient behind door and not able to understand them. On consultation patient is globally aphasic, alert and shows a right facial droop and arm>leg weakness. Patient was on no ASA prior to hospitalization.   Date last known well: Date: 05/09/2013  Time last known well: Time: 21:30  tPA Given: No: out of the window   SUBJECTIVE Continues to have ex[ressive language difficulties and right sided weakness  OBJECTIVE Most recent Vital Signs: Filed Vitals:   05/11/13 1700 05/11/13 2140 05/12/13 0214 05/12/13 0700  BP: 125/65 120/69 124/65 120/60  Pulse: 110 117 119 103  Temp: 98.6 F (37 C) 98.7 F (37.1 C) 97.5 F (36.4 C) 97.6 F (36.4 C)  TempSrc: Oral Oral Oral Oral  Resp: 18 18 18 20   Height:      Weight:      SpO2: 95% 95% 97% 94%   CBG (last 3)   Recent Labs  05/10/13 1022  GLUCAP 117*    IV Fluid Intake:     MEDICATIONS  . aspirin  300 mg Rectal Daily   Or  . aspirin  325 mg Oral Daily  . atorvastatin  10 mg Oral q1800  . cefTRIAXone (ROCEPHIN)  IV  1 g Intravenous Q24H  . erlotinib  100 mg Oral QAC breakfast   PRN:  acetaminophen, acetaminophen, diphenoxylate-atropine, oxyCODONE-acetaminophen, traMADol  Diet:  Cardiac thin liquids Activity:  Up with assistance DVT Prophylaxis:  SCDs  CLINICALLY SIGNIFICANT STUDIES Basic Metabolic Panel:   Recent Labs Lab 05/10/13 0858 05/10/13 0908  NA 139 138  K 3.5 3.5  CL 100 102  CO2 25  --   GLUCOSE 119* 118*  BUN 24* 24*  CREATININE 0.96 1.20*  CALCIUM 9.1  --    Liver Function Tests:   Recent Labs Lab  05/10/13 0858 05/11/13 0348  AST 60* 57*  ALT 57* 51*  ALKPHOS 177* 169*  BILITOT 1.4* 1.5*  PROT 6.3 5.9*  ALBUMIN 2.8* 2.6*   CBC:   Recent Labs Lab 05/10/13 0858 05/10/13 0908  WBC 11.5*  --   NEUTROABS 10.5*  --   HGB 12.6 13.3  HCT 38.3 39.0  MCV 87.4  --   PLT 73*  --    Coagulation:   Recent Labs Lab 05/10/13 0858  LABPROT 15.1  INR 1.22   Cardiac Enzymes:   Recent Labs Lab 05/10/13 0858 05/10/13 1524 05/10/13 2210 05/11/13 1412  CKTOTAL 155  --   --   --   TROPONINI 1.84* 7.51* 7.55* 2.80*   Urinalysis:   Recent Labs Lab 05/10/13 0911  COLORURINE RED*  LABSPEC 1.022  PHURINE 6.0  GLUCOSEU NEGATIVE  HGBUR LARGE*  BILIRUBINUR SMALL*  KETONESUR 15*  PROTEINUR 30*  UROBILINOGEN 1.0  NITRITE NEGATIVE  LEUKOCYTESUR MODERATE*   Lipid Panel    Component Value Date/Time   CHOL 193 05/11/2013 0348   TRIG 101 05/11/2013 0348   HDL 58 05/11/2013 0348   CHOLHDL 3.3 05/11/2013 0348   VLDL 20 05/11/2013 0348   LDLCALC 115* 05/11/2013 0348  HgbA1C  Lab Results  Component Value Date   HGBA1C 5.4 05/11/2013    Urine Drug Screen:     Component Value Date/Time   LABOPIA NONE DETECTED 05/10/2013 0911   COCAINSCRNUR NONE DETECTED 05/10/2013 0911   LABBENZ NONE DETECTED 05/10/2013 0911   AMPHETMU NONE DETECTED 05/10/2013 0911   THCU NONE DETECTED 05/10/2013 0911   LABBARB NONE DETECTED 05/10/2013 0911    Alcohol Level:   Recent Labs Lab 05/10/13 0858  ETH <11    Dg Chest 2 View 05/11/2013    1. No active cardiopulmonary disease. 2. Chronic, small right pleural effusion. 3. Treated right lung cancer which is followed by CT.     Ct Head Wo Contrast 05/10/2013    Atrophy and chronic ischemia.  No acute abnormality.  Mild cervical degenerative change.  No acute bony abnormality.    Ct Cervical Spine Wo Contrast 05/10/2013    Atrophy and chronic ischemia.  No acute abnormality.  Mild cervical degenerative change.  No acute bony  abnormality.      MRI Head Wo Contrast 05/10/2013    Multiple acute/subacute infarcts throughout the cerebellum bilaterally and slightly larger on the left. Supratentorial bilateral infarcts greater on the left with largest area of acute infarction involving the posterior left operculum region. There is a parasagittal distribution of some of the infarcts involving the frontal-parietal lobe which may indicate a component of watershed infarction. However, with involvement of multiple vascular territories, embolus is of concern.  Intracranial atherosclerotic type changes as noted above most notable involving branch vessels and posterior circulation.      MRA Brain W Wo Contrast 05/10/2013    Decrease number of visualized middle cerebral artery branch vessels. The middle cerebral artery branches which are visualized are narrowed and irregular with narrowing more notable on the left.  Moderate stenosis portions of the A2 segment of the right anterior cerebral artery.  Fetal type contribution to the posterior circulation.  Left vertebral artery is small and ends in a posterior inferior cerebellar artery distribution. Mild to moderate narrowing of portions of the distal left vertebral artery and the left posterior inferior cerebellar artery.  Mild to moderate narrowing of the distal right vertebral artery and proximal basilar artery. Mild narrowing of portions of the right posterior inferior cerebellar artery.  Non visualization of the anterior inferior cerebellar arteries.  Moderate narrowing superior cerebral artery bilaterally.  There is a bulge along the superior margin of the the basilar tip. This may reflect result of fetal type origin of the posterior cerebral arteries. Small aneurysm at this level is not excluded although felt less likely consideration.  Mild to moderate narrowing distal branches of the posterior cerebral artery bilaterally.  IMPRESSION:  Intracranial atherosclerotic type changes as noted  above most notable involving branch vessels and posterior circulation.       2D Echocardiogram  ejection fraction 55-60% - no cardiac source of emboli identified.  Carotid Doppler  Findings suggest 1-39% internal carotid artery stenosis bilaterally. Vertebral arteries are patent with antegrade flow.   EKG  sinus tachycardia rate 107 beats per minute   Therapy Recommendations evaluations pending  Physical Exam   Mental Status:  Alert, expressive aphasic. Able to follow simple commands.  Cranial Nerves:  II: Discs flat bilaterally; Visual fields blinks to threat bilaterally, pupils equal, round, reactive to light and accommodation  III,IV, VI: ptosis not present, extra-ocular motions intact bilaterally  V,VII: smile asymmetric on the right, facial light touch sensation normal bilaterally  VIII:  hearing normal bilaterally  IX,X: gag reflex present  XI: bilateral shoulder shrug  XII: midline tongue extension without atrophy or fasciculations  Motor:  Right : Upper extremity 1/5 Left: Upper extremity 5/5  Lower extremity 3/5 Lower extremity 5/5  Tone and bulk:normal tone throughout; no atrophy noted  Sensory: Pinprick and light touch intact throughout, bilaterally  Deep Tendon Reflexes:  Right: Upper Extremity Left: Upper extremity  biceps (C-5 to C-6) 2/4 biceps (C-5 to C-6) 2/4  tricep (C7) 2/4 triceps (C7) 2/4  Brachioradialis (C6) 2/4 Brachioradialis (C6) 2/4  Lower Extremity Lower Extremity  quadriceps (L-2 to L-4) 2/4 quadriceps (L-2 to L-4) 2/4  Achilles (S1) 2/4 Achilles (S1) 2/4  Plantars:  Right: up going Left: downgoing  Cerebellar:  Unable to obtain due to aphasia  Gait: not assessed  CV: pulses palpable throughout    ASSESSMENT Ms. JENNALYN CAWLEY is a 79 y.o. female presenting with global aphasia and right hemiparesis. TPA was not given as the patient was passed the window for treatment.. Imaging revealed multiple acute/subacute infarcts throughout the cerebellum  bilaterally and slightly larger on the left. Supratentorial bilateral infarcts greater on the left with largest area of acute infarction involving the posterior left operculum region.  Infarcts felt to be embolic likley due to hypercoagulability from cancer. Marantic endocarditis possible too. Doubt brain metastasis.On no antithrombotics prior to admission. Now on aspirin 325 mg orally every day for secondary stroke prevention. Patient with resultant globally aphasia and right hemiparesis.  Work up underway.   Lung cancer with liver metastasis status post chemotherapy  Hypertension  Hyperlipidemia - currently on Lipitor  Urinary tract infection - IV Rocephin  Elevated troponin enzymes - cardiology following  Renal insufficiency  Tachycardia  Hospital day # 2  TREATMENT/PLAN  Continue aspirin 325 mg orally every day for secondary stroke prevention.TEE will add to daignosis but unlikely to change treatment as she is poor anticoagulation candidate  Await therapy evaluations. May be a candidate for inpatient rehabilitation.  D/W husband, daughter and Dr Clelia Croft  Prognosis poor given advanced metastattic lung cancer.   Delton See PA-C [facility] Pager [phone] 05/12/2013, 7:47 AM  I have personally obtained a history, examined the patient, evaluated imaging results, and formulated the assessment and plan of care. I agree with the above. Delia Heady, MD

## 2013-05-12 NOTE — Progress Notes (Signed)
PCP Visit: History reviewed with patient and discussed with Dr. Catha Gosselin.  Appreciate [facility] management of this patient with acute CVA with multiple emboli seen on MRI, ?hypercoagulable vs. Marantic.  Given other issues and complex decision making I will assume care to assist family in considering options- consideration of TEE, anticoagulation, consideration of palliative care, disposition options.

## 2013-05-12 NOTE — Progress Notes (Signed)
Subjective:   Objective: Vital signs in last 24 hours: Temp:  [97.5 F (36.4 C)-98.7 F (37.1 C)] 98 F (36.7 C) (11/22 0935) Pulse Rate:  [103-120] 120 (11/22 0935) Resp:  [18-20] 20 (11/22 0935) BP: (111-141)/(55-69) 111/55 mmHg (11/22 0935) SpO2:  [93 %-97 %] 93 % (11/22 0935) Weight change:  Last BM Date: 05/09/13 Intake/Output from previous day: -260 11/21 0701 - 11/22 0700 In: 290 [P.O.:240; IV Piggyback:50] Out: 550 [Urine:550] Intake/Output this shift: Total I/O In: 60 [P.O.:60] Out: -   PE: Per MD: General:Pleasant affect, NAD Skin:Warm and dry, brisk capillary refill HEENT:normocephalic, sclera clear, mucus membranes moist Neck:supple, no JVD, no bruits  Heart:S1S2 RRR without murmur, gallup, rub or click Lungs:clear without rales, rhonchi, or wheezes NWG:[name], non tender, + BS, do not palpate liver spleen or masses Ext:no lower ext edema, 2+ pedal pulses, 2+ radial pulses Neuro:alert and oriented, MAE, follows commands, + facial symmetry   Lab Results:  Recent Labs  05/10/13 0858 05/10/13 0908  WBC 11.5*  --   HGB 12.6 13.3  HCT 38.3 39.0  PLT 73*  --    BMET  Recent Labs  05/10/13 0858 05/10/13 0908  NA 139 138  K 3.5 3.5  CL 100 102  CO2 25  --   GLUCOSE 119* 118*  BUN 24* 24*  CREATININE 0.96 1.20*  CALCIUM 9.1  --     Recent Labs  05/10/13 2210 05/11/13 1412  TROPONINI 7.55* 2.80*    Lab Results  Component Value Date   CHOL 193 05/11/2013   HDL 58 05/11/2013   LDLCALC 115* 05/11/2013   TRIG 101 05/11/2013   CHOLHDL 3.3 05/11/2013   Lab Results  Component Value Date   HGBA1C 5.4 05/11/2013     No results found for this basename: TSH    Hepatic Function Panel  Recent Labs  05/11/13 0348  PROT 5.9*  ALBUMIN 2.6*  AST 57*  ALT 51*  ALKPHOS 169*  BILITOT 1.5*  BILIDIR 0.3  IBILI 1.2*    Recent Labs  05/11/13 0348  CHOL 193   No results found for this basename: PROTIME,  in the last 72  hours    Studies/Results: Dg Chest 2 View  05/11/2013   CLINICAL DATA:  Stroke and right-sided weakness.  EXAM: CHEST  2 VIEW  COMPARISON:  Chest CT 02/15/2013  FINDINGS: Right IJ porta catheter in stable position. Right perihilar mass with surrounding interstitial changes in this patient with treated lung cancer. No recent radiography for direct comparison. Small right pleural effusion which is chronic. Left lung is well-aerated.  IMPRESSION: 1. No active cardiopulmonary disease. 2. Chronic, small right pleural effusion. 3. Treated right lung cancer which is followed by CT.   Electronically Signed   By: Tiburcio Pea M.D.   On: 05/11/2013 05:31   Mr Maxine Glenn Head Wo Contrast  05/10/2013   CLINICAL DATA:  Weakness.  History of lung cancer.  EXAM: MRI HEAD WITHOUT CONTRAST  MRA HEAD WITHOUT CONTRAST  TECHNIQUE: Multiplanar, multiecho pulse sequences of the brain and surrounding structures were obtained without intravenous contrast. Angiographic images of the head were obtained using MRA technique without contrast.  COMPARISON:  05/10/2013 CT.  09/30/2011 brain MR.  FINDINGS: MRI HEAD FINDINGS  Multiple acute/subacute infarcts throughout the cerebellum bilaterally and slightly larger on the left. Supratentorial bilateral infarcts greater on the left with largest area of acute infarction involving the posterior left operculum region. There is a  parasagittal distribution of some of the infarcts involving the frontal-parietal lobe which may indicate a component of watershed infarction. However, with involvement of multiple vascular territories, embolus is of concern.  No intracranial hemorrhage.  Mild small vessel disease type changes.  Global atrophy without hydrocephalus.  No intracranial mass lesion noted on this unenhanced exam.  MRA HEAD FINDINGS  Ectatic distal vertical cervical segment of the left internal carotid artery. No significant stenosis of either internal carotid artery or carotid terminus.   Aplastic A1 segment right anterior cerebral artery.  No significant stenosis of the M1 segment of the middle cerebral artery on either side.  Decrease number of visualized middle cerebral artery branch vessels. The middle cerebral artery branches which are visualized are narrowed and irregular with narrowing more notable on the left.  Moderate stenosis portions of the A2 segment of the right anterior cerebral artery.  Fetal type contribution to the posterior circulation.  Left vertebral artery is small and ends in a posterior inferior cerebellar artery distribution. Mild to moderate narrowing of portions of the distal left vertebral artery and the left posterior inferior cerebellar artery.  Mild to moderate narrowing of the distal right vertebral artery and proximal basilar artery. Mild narrowing of portions of the right posterior inferior cerebellar artery.  Non visualization of the anterior inferior cerebellar arteries.  Moderate narrowing superior cerebral artery bilaterally.  There is a bulge along the superior margin of the the basilar tip. This may reflect result of fetal type origin of the posterior cerebral arteries. Small aneurysm at this level is not excluded although felt less likely consideration.  Mild to moderate narrowing distal branches of the posterior cerebral artery bilaterally.  IMPRESSION: Multiple acute/subacute infarcts throughout the cerebellum bilaterally and slightly larger on the left. Supratentorial bilateral infarcts greater on the left with largest area of acute infarction involving the posterior left operculum region. There is a parasagittal distribution of some of the infarcts involving the frontal-parietal lobe which may indicate a component of watershed infarction. However, with involvement of multiple vascular territories, embolus is of concern.  Intracranial atherosclerotic type changes as noted above most notable involving branch vessels and posterior circulation.  These results  were called by telephone at the time of interpretation on 05/10/2013 at 2:27 PM to Dr. Linwood Dibbles , who verbally acknowledged these results.   Electronically Signed   By: Bridgett Larsson M.D.   On: 05/10/2013 14:37   Mr Laqueta Jean ZO Contrast  05/10/2013   CLINICAL DATA:  Weakness.  History of lung cancer.  EXAM: MRI HEAD WITHOUT CONTRAST  MRA HEAD WITHOUT CONTRAST  TECHNIQUE: Multiplanar, multiecho pulse sequences of the brain and surrounding structures were obtained without intravenous contrast. Angiographic images of the head were obtained using MRA technique without contrast.  COMPARISON:  05/10/2013 CT.  09/30/2011 brain MR.  FINDINGS: MRI HEAD FINDINGS  Multiple acute/subacute infarcts throughout the cerebellum bilaterally and slightly larger on the left. Supratentorial bilateral infarcts greater on the left with largest area of acute infarction involving the posterior left operculum region. There is a parasagittal distribution of some of the infarcts involving the frontal-parietal lobe which may indicate a component of watershed infarction. However, with involvement of multiple vascular territories, embolus is of concern.  No intracranial hemorrhage.  Mild small vessel disease type changes.  Global atrophy without hydrocephalus.  No intracranial mass lesion noted on this unenhanced exam.  MRA HEAD FINDINGS  Ectatic distal vertical cervical segment of the left internal carotid artery. No significant stenosis  of either internal carotid artery or carotid terminus.  Aplastic A1 segment right anterior cerebral artery.  No significant stenosis of the M1 segment of the middle cerebral artery on either side.  Decrease number of visualized middle cerebral artery branch vessels. The middle cerebral artery branches which are visualized are narrowed and irregular with narrowing more notable on the left.  Moderate stenosis portions of the A2 segment of the right anterior cerebral artery.  Fetal type contribution to the  posterior circulation.  Left vertebral artery is small and ends in a posterior inferior cerebellar artery distribution. Mild to moderate narrowing of portions of the distal left vertebral artery and the left posterior inferior cerebellar artery.  Mild to moderate narrowing of the distal right vertebral artery and proximal basilar artery. Mild narrowing of portions of the right posterior inferior cerebellar artery.  Non visualization of the anterior inferior cerebellar arteries.  Moderate narrowing superior cerebral artery bilaterally.  There is a bulge along the superior margin of the the basilar tip. This may reflect result of fetal type origin of the posterior cerebral arteries. Small aneurysm at this level is not excluded although felt less likely consideration.  Mild to moderate narrowing distal branches of the posterior cerebral artery bilaterally.  IMPRESSION: Multiple acute/subacute infarcts throughout the cerebellum bilaterally and slightly larger on the left. Supratentorial bilateral infarcts greater on the left with largest area of acute infarction involving the posterior left operculum region. There is a parasagittal distribution of some of the infarcts involving the frontal-parietal lobe which may indicate a component of watershed infarction. However, with involvement of multiple vascular territories, embolus is of concern.  Intracranial atherosclerotic type changes as noted above most notable involving branch vessels and posterior circulation.  These results were called by telephone at the time of interpretation on 05/10/2013 at 2:27 PM to Dr. Linwood Dibbles , who verbally acknowledged these results.   Electronically Signed   By: Bridgett Larsson M.D.   On: 05/10/2013 14:37   2D Echo: Left ventricle: The cavity size was normal. Wall thickness was normal. Systolic function was normal. The estimated ejection fraction was in the range of 55% to 60%. Although no diagnostic regional wall motion abnormality  was identified, this possibility cannot be completely excluded on the basis of this study. - Aortic valve: Trivial regurgitation. - Tricuspid valve: Moderate regurgitation. - Pulmonary arteries: Systolic pressure was mildly increased. PA peak pressure: 44mm Hg (S). - Pericardium, extracardiac: There was a right pleural effusion.    Medications: I have reviewed the patient's current medications. Scheduled Meds: . aspirin  300 mg Rectal Daily   Or  . aspirin  325 mg Oral Daily  . atorvastatin  10 mg Oral q1800  . cefTRIAXone (ROCEPHIN)  IV  1 g Intravenous Q24H  . erlotinib  100 mg Oral QAC breakfast   Continuous Infusions: . sodium chloride 75 mL/hr at 05/12/13 1256   PRN Meds:.acetaminophen, acetaminophen, diphenoxylate-atropine, oxyCODONE-acetaminophen, traMADol  Assessment/Plan: Principal Problem:   CVA (cerebral infarction), right hemiplegia and expressive aphasia Active Problems:   Lung cancer   COPD (chronic obstructive pulmonary disease)   Hypertension   Hypercholesterolemia   Peripheral vascular disease, unspecified   Thrombocytopenia   UTI (lower urinary tract infection)   Elevated troponin  PLAN:troponin's coming down.  Echo with normal EF and Mod TR and elevated PA pressure of 44.    Small MI  Per Dr. Royann Shivers "Synchronous acute MI and multiple territory strokes raises the concern for cardioembolic event. Consider as-yet-unidentified AFib  or marantic endocarditis as possible etiologies. Discussed possible indication for TEE to elucidate these possibilities. But I also encouraged the patient and family to consider hard whether they wish to pursue a course of care that is focused more on aggressive intervention (e.g. TEE that could lead to indication for warfarin therapy, etc) versus one focused more on symptom palliation. They will think about this."  Dr. Royann Shivers is available for TEE if they desire.   LOS: 2 days   Time spent with pt. :15 minutes. [facility]  R  Nurse Practitioner Certified Pager [phone] 05/12/2013, 1:08 PM   Agree with note written by Nada Boozer RNP  I spoke with Dr. Clelia Croft and he agrees that TEE prob wouldn't alter treatment strategy. Pt prob not a coumadin candidate and stroke distribution suggested embolic source. Will s/o but am available if you need further assistance.  Runell Gess 05/12/2013 2:22 PM

## 2013-05-13 DIAGNOSIS — E43 Unspecified severe protein-calorie malnutrition: Secondary | ICD-10-CM | POA: Diagnosis present

## 2013-05-13 LAB — CBC
HCT: 35.4 % — ABNORMAL LOW (ref 36.0–46.0)
Hemoglobin: 11.4 g/dL — ABNORMAL LOW (ref 12.0–15.0)
MCH: 28.6 pg (ref 26.0–34.0)
MCV: 88.7 fL (ref 78.0–100.0)
Platelets: 75 10*3/uL — ABNORMAL LOW (ref 150–400)
RBC: 3.99 MIL/uL (ref 3.87–5.11)
WBC: 11.3 10*3/uL — ABNORMAL HIGH (ref 4.0–10.5)

## 2013-05-13 LAB — BASIC METABOLIC PANEL
BUN: 21 mg/dL (ref 6–23)
CO2: 23 mEq/L (ref 19–32)
Calcium: 8.4 mg/dL (ref 8.4–10.5)
Chloride: 106 mEq/L (ref 96–112)
Glucose, Bld: 106 mg/dL — ABNORMAL HIGH (ref 70–99)
Sodium: 140 mEq/L (ref 135–145)

## 2013-05-13 MED ORDER — SODIUM CHLORIDE 0.9 % IJ SOLN
10.0000 mL | INTRAMUSCULAR | Status: DC | PRN
  Administered 2013-05-15: 10 mL

## 2013-05-13 MED ORDER — ENSURE COMPLETE PO LIQD
237.0000 mL | Freq: Two times a day (BID) | ORAL | Status: DC
  Administered 2013-05-13 – 2013-05-15 (×5): 237 mL via ORAL

## 2013-05-13 MED ORDER — CIPROFLOXACIN HCL 500 MG PO TABS
500.0000 mg | ORAL_TABLET | Freq: Two times a day (BID) | ORAL | Status: DC
  Administered 2013-05-13 – 2013-05-15 (×5): 500 mg via ORAL
  Filled 2013-05-13 (×7): qty 1

## 2013-05-13 MED ORDER — SODIUM CHLORIDE 0.9 % IJ SOLN: 10.0000 mL | Freq: Two times a day (BID) | INTRAMUSCULAR | Status: DC

## 2013-05-13 NOTE — Progress Notes (Signed)
Stroke Team Progress Note  HISTORY  [name] is a 79 y.o. female who has known lung and liver cancer. 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 Patient is followed by Dr. Arbutus Ped as out patient . Family noted patient to be normal on the evening of 05/09/2013 but the following morning when family went to check on her they were unable to open the bedroom due to patient behind door and not able to understand them. On consultation patient was noted to be globally aphasic, alert with a right facial droop and arm>leg weakness. Patient was on no ASA prior to hospitalization.   Date last known well: Date: 05/09/2013  Time last known well: Time: 21:30  tPA Given: No: out of the window   SUBJECTIVE Dr. Pearlean Brownie spoke with the family today regarding her condition and prognosis. The patient remains globally aphasic.  OBJECTIVE Most recent Vital Signs: Filed Vitals:   05/12/13 1919 05/12/13 2100 05/13/13 0158 05/13/13 0604  BP: 110/67 127/55 124/61 129/73  Pulse: 101 106 109 108  Temp: 99.8 F (37.7 C) 97.9 F (36.6 C) 97.4 F (36.3 C) 97.7 F (36.5 C)  TempSrc: Oral Oral Oral Oral  Resp: 20 20 18 18   Height:      Weight:      SpO2: 93% 94% 92% 90%   CBG (last 3)   Recent Labs  05/10/13 1022  GLUCAP 117*    IV Fluid Intake:   . sodium chloride 75 mL/hr at 05/12/13 1256    MEDICATIONS  . aspirin  300 mg Rectal Daily   Or  . aspirin  325 mg Oral Daily  . atorvastatin  10 mg Oral q1800  . cefTRIAXone (ROCEPHIN)  IV  1 g Intravenous Q24H  . erlotinib  100 mg Oral QAC breakfast  . sodium chloride  10-40 mL Intracatheter Q12H   PRN:  acetaminophen, acetaminophen, diphenoxylate-atropine, oxyCODONE-acetaminophen, sodium chloride, traMADol  Diet:  Cardiac thin liquids Activity:  Up with assistance DVT Prophylaxis:  SCDs  CLINICALLY SIGNIFICANT STUDIES Basic Metabolic Panel:   Recent Labs Lab 05/10/13 0858  05/10/13 0908  NA 139 138  K 3.5 3.5  CL 100 102  CO2 25  --   GLUCOSE 119* 118*  BUN 24* 24*  CREATININE 0.96 1.20*  CALCIUM 9.1  --    Liver Function Tests:   Recent Labs Lab 05/10/13 0858 05/11/13 0348  AST 60* 57*  ALT 57* 51*  ALKPHOS 177* 169*  BILITOT 1.4* 1.5*  PROT 6.3 5.9*  ALBUMIN 2.8* 2.6*   CBC:   Recent Labs Lab 05/10/13 0858 05/10/13 0908  WBC 11.5*  --   NEUTROABS 10.5*  --   HGB 12.6 13.3  HCT 38.3 39.0  MCV 87.4  --   PLT 73*  --    Coagulation:   Recent Labs Lab 05/10/13 0858  LABPROT 15.1  INR 1.22   Cardiac Enzymes:   Recent Labs Lab 05/10/13 0858 05/10/13 1524 05/10/13 2210 05/11/13 1412  CKTOTAL 155  --   --   --   TROPONINI 1.84* 7.51* 7.55* 2.80*   Urinalysis:   Recent Labs Lab 05/10/13 0911  COLORURINE RED*  LABSPEC 1.022  PHURINE 6.0  GLUCOSEU NEGATIVE  HGBUR LARGE*  BILIRUBINUR SMALL*  KETONESUR 15*  PROTEINUR 30*  UROBILINOGEN 1.0  NITRITE NEGATIVE  LEUKOCYTESUR MODERATE*   Lipid Panel    Component Value Date/Time   CHOL 193  05/11/2013 0348   TRIG 101 05/11/2013 0348   HDL 58 05/11/2013 0348   CHOLHDL 3.3 05/11/2013 0348   VLDL 20 05/11/2013 0348   LDLCALC 115* 05/11/2013 0348   HgbA1C  Lab Results  Component Value Date   HGBA1C 5.4 05/11/2013    Urine Drug Screen:     Component Value Date/Time   LABOPIA NONE DETECTED 05/10/2013 0911   COCAINSCRNUR NONE DETECTED 05/10/2013 0911   LABBENZ NONE DETECTED 05/10/2013 0911   AMPHETMU NONE DETECTED 05/10/2013 0911   THCU NONE DETECTED 05/10/2013 0911   LABBARB NONE DETECTED 05/10/2013 0911    Alcohol Level:   Recent Labs Lab 05/10/13 0858  ETH <11    Dg Chest 2 View 05/11/2013    1. No active cardiopulmonary disease. 2. Chronic, small right pleural effusion. 3. Treated right lung cancer which is followed by CT.     Ct Head Wo Contrast 05/10/2013    Atrophy and chronic ischemia.  No acute abnormality.  Mild cervical degenerative  change.  No acute bony abnormality.    Ct Cervical Spine Wo Contrast 05/10/2013    Atrophy and chronic ischemia.  No acute abnormality.  Mild cervical degenerative change.  No acute bony abnormality.      MRI Head Wo Contrast 05/10/2013    Multiple acute/subacute infarcts throughout the cerebellum bilaterally and slightly larger on the left. Supratentorial bilateral infarcts greater on the left with largest area of acute infarction involving the posterior left operculum region. There is a parasagittal distribution of some of the infarcts involving the frontal-parietal lobe which may indicate a component of watershed infarction. However, with involvement of multiple vascular territories, embolus is of concern.  Intracranial atherosclerotic type changes as noted above most notable involving branch vessels and posterior circulation.      MRA Brain W Wo Contrast 05/10/2013    Decrease number of visualized middle cerebral artery branch vessels. The middle cerebral artery branches which are visualized are narrowed and irregular with narrowing more notable on the left.  Moderate stenosis portions of the A2 segment of the right anterior cerebral artery.  Fetal type contribution to the posterior circulation.  Left vertebral artery is small and ends in a posterior inferior cerebellar artery distribution. Mild to moderate narrowing of portions of the distal left vertebral artery and the left posterior inferior cerebellar artery.  Mild to moderate narrowing of the distal right vertebral artery and proximal basilar artery. Mild narrowing of portions of the right posterior inferior cerebellar artery.  Non visualization of the anterior inferior cerebellar arteries.  Moderate narrowing superior cerebral artery bilaterally.  There is a bulge along the superior margin of the the basilar tip. This may reflect result of fetal type origin of the posterior cerebral arteries. Small aneurysm at this level is not excluded  although felt less likely consideration.  Mild to moderate narrowing distal branches of the posterior cerebral artery bilaterally.  IMPRESSION:  Intracranial atherosclerotic type changes as noted above most notable involving branch vessels and posterior circulation.       2D Echocardiogram  ejection fraction 55-60% - no cardiac source of emboli identified.  Carotid Doppler  Findings suggest 1-39% internal carotid artery stenosis bilaterally. Vertebral arteries are patent with antegrade flow.   EKG  sinus tachycardia rate 107 beats per minute   Therapy Recommendations CIR recommended with 24-hour per day supervision  Physical Exam   Mental Status:  Alert,  Mild expressive aphasia. Able to follow simple commands.  Cranial Nerves:  II: Discs  flat bilaterally; Visual fields blinks to threat bilaterally, pupils equal, round, reactive to light and accommodation  III,IV, VI: ptosis not present, extra-ocular motions intact bilaterally  V,VII: smile asymmetric on the right, facial light touch sensation normal bilaterally  VIII: hearing normal bilaterally  IX,X: gag reflex present  XI: bilateral shoulder shrug  XII: midline tongue extension without atrophy or fasciculations  Motor:  Right : Upper extremity 1/5 Left: Upper extremity 5/5  Lower extremity 3/5 Lower extremity 5/5  Tone and bulk:normal tone throughout; no atrophy noted  Sensory: Pinprick and light touch intact throughout, bilaterally  Deep Tendon Reflexes:  Right: Upper Extremity Left: Upper extremity  biceps (C-5 to C-6) 2/4 biceps (C-5 to C-6) 2/4  tricep (C7) 2/4 triceps (C7) 2/4  Brachioradialis (C6) 2/4 Brachioradialis (C6) 2/4  Lower Extremity Lower Extremity  quadriceps (L-2 to L-4) 2/4 quadriceps (L-2 to L-4) 2/4  Achilles (S1) 2/4 Achilles (S1) 2/4  Plantars:  Right: up going Left: downgoing  Cerebellar:  Unable to obtain due to aphasia  Gait: not assessed  CV: pulses palpable throughout    ASSESSMENT [name] is a 79 y.o. female presenting with global aphasia and right hemiparesis. TPA was not given as the patient was passed the window for treatment.. Imaging revealed multiple acute/subacute infarcts throughout the cerebellum bilaterally and slightly larger on the left. Supratentorial bilateral infarcts greater on the left with largest area of acute infarction involving the posterior left operculum region.  Infarcts felt to be embolic likley due to hypercoagulability from cancer. Marantic endocarditis possible too. Doubt brain metastasis.On no antithrombotics prior to admission. Now on aspirin 325 mg orally every day for secondary stroke prevention. Patient with resultant globally aphasia and right hemiparesis.  Work up underway.   Lung cancer with liver metastasis status post chemotherapy  Hypertension  Hyperlipidemia - currently on Lipitor  Urinary tract infection - IV Rocephin  Elevated troponin enzymes - cardiology following  Renal insufficiency  Tachycardia  Hospital day # 3  TREATMENT/PLAN  Continue aspirin 325 mg orally every day for secondary stroke prevention.TEE will add to daignosis but unlikely to change treatment as she is poor anticoagulation candidate  Await therapy evaluations. May be a candidate for inpatient rehabilitation.  D/W husband, daughter and Dr Clelia Croft  Prognosis poor given advanced metastattic lung cancer.   See cardiology note 05/11/2013 and 05/12/2013 regarding thoughts on TEE.   Inpatient rehabilitation recommended by the therapists. Rehabilitation physician consult pending.  Long discussion with husband and daughter about stroke, prognosis, etiology, life expectancy and answered questions.  Delton See PA-C [facility] Pager [phone] 05/13/2013, 7:47 AM  I have personally obtained a history, examined the patient, evaluated imaging results, and formulated the assessment and plan of care. I agree with the above.  Delia Heady, MD

## 2013-05-13 NOTE — Progress Notes (Signed)
Subjective: Having right shoulder pain which is worse today.  No change in strength.  Able to wiggle fingers some.    Objective: Vital signs in last 24 hours: Temp:  [97.4 F (36.3 C)-99.8 F (37.7 C)] 97.7 F (36.5 C) (11/23 0604) Pulse Rate:  [101-120] 108 (11/23 0604) Resp:  [18-20] 18 (11/23 0604) BP: (110-129)/(55-73) 129/73 mmHg (11/23 0604) SpO2:  [90 %-94 %] 90 % (11/23 0604) Weight change:  Last BM Date: 05/12/13  CBG (last 3)   Recent Labs  05/10/13 1022  GLUCAP 117*    Intake/Output from previous day: 11/22 0701 - 11/23 0700 In: 1465 [P.O.:110; I.V.:1355] Out: -  Intake/Output this shift:    General appearance: alert, no distress and moderate expressive aphasia Eyes: no scleral icterus Throat: oropharynx moist without erythema Resp: clear to auscultation bilaterally Cardio: tachycardia with occasional ectopy GI: soft, non-tender; bowel sounds normal; no masses,  no organomegaly Extremities: no clubbing, cyanosis or edema Neurologic: right sided weakness proximal arm>distal arm>leg; right facial droop; expressive aphasia  Lab Results:  Recent Labs  05/10/13 0908  NA 138  K 3.5  CL 102  GLUCOSE 118*  BUN 24*  CREATININE 1.20*    Recent Labs  05/11/13 0348  AST 57*  ALT 51*  ALKPHOS 169*  BILITOT 1.5*  PROT 5.9*  ALBUMIN 2.6*    Recent Labs  05/10/13 0908  HGB 13.3  HCT 39.0   Lab Results  Component Value Date   INR 1.22 05/10/2013   INR 0.99 01/12/2011   INR 0.95 12/30/2010    Recent Labs  05/10/13 1524 05/10/13 2210 05/11/13 1412  TROPONINI 7.51* 7.55* 2.80*   No results found for this basename: TSH, T4TOTAL, FREET3, T3FREE, THYROIDAB,  in the last 72 hours No results found for this basename: VITAMINB12, FOLATE, FERRITIN, TIBC, IRON, RETICCTPCT,  in the last 72 hours  Studies/Results: No results found.   Medications: Scheduled: . aspirin  300 mg Rectal Daily   Or  . aspirin  325 mg Oral Daily  . atorvastatin  10 mg  Oral q1800  . cefTRIAXone (ROCEPHIN)  IV  1 g Intravenous Q24H  . erlotinib  100 mg Oral QAC breakfast  . sodium chloride  10-40 mL Intracatheter Q12H   Continuous: . sodium chloride 75 mL/hr at 05/13/13 0700    Assessment/Plan: Principal Problem: 1. CVA (cerebral infarction), right hemiplegia and expressive aphasia-  Discussed with Dr. Allyson Sabal and Dr. Pearlean Brownie- all in agreement that CVA is most likely embolic in setting of hypercoagulability from malignancy.  TEE unlikely to change management as she is a poor anticoagulation candidate due to bleeding risk with malignancy/chemotherapy.  Continue ASA for secondary stroke prevention.   Active Problems: 2. Non-ST Elevation MI- likely secondary to CVA/hypercoagulability.  Given normal EF and no wall motion abnormalities, no further studies warranted.  Will add low dose Bblocker if BP increased or HR remains elevated but need to avoid hypotension in setting of CVA. 3. Lung cancer with bony metastases- continue Tarceva- tolerating well except for diarrhea.  Follow-up with Dr. Shirline Frees to discuss goals of therapy.  Continue pain control for shoulder pain.   4. COPD (chronic obstructive pulmonary disease)- stable without therapy. 5. Hypertension- consider BBlocker if increased.  Avoid hypotension due to recent CVA. 6. Hypercholesterolemia- continue statin. 7.Thrombocytopenia- recheck CBC today. 8. UTI (lower urinary tract infection)- change Rocephin to Cipro as oral alternative based on urine culture data. 9. Severe Protein Calorie Malnutrition-add ensure shakes 10. Disposition- Inpatient Rehab consult pending.  Possible discharge tomorrow to CIR or SNF rehab pending bed availability.   LOS: 3 days   Ramiyah Mcclenahan,W [name] 05/13/2013, 9:07 AM

## 2013-05-14 ENCOUNTER — Other Ambulatory Visit: Payer: Self-pay | Admitting: *Deleted

## 2013-05-14 ENCOUNTER — Ambulatory Visit
Admit: 2013-05-14 | Discharge: 2013-05-14 | Disposition: A | Discharge status: Home / Self Care | Payer: Medicare Other | Attending: Radiation Oncology | Admitting: Radiation Oncology

## 2013-05-14 ENCOUNTER — Inpatient Hospital Stay (HOSPITAL_COMMUNITY): Payer: Medicare Other

## 2013-05-14 ENCOUNTER — Telehealth: Payer: Self-pay | Admitting: *Deleted

## 2013-05-14 DIAGNOSIS — C7951 Secondary malignant neoplasm of bone: Secondary | ICD-10-CM | POA: Diagnosis present

## 2013-05-14 DIAGNOSIS — I6330 Cerebral infarction due to thrombosis of unspecified cerebral artery: Secondary | ICD-10-CM

## 2013-05-14 LAB — BASIC METABOLIC PANEL
BUN: 19 mg/dL (ref 6–23)
CO2: 24 mEq/L (ref 19–32)
Calcium: 8.2 mg/dL — ABNORMAL LOW (ref 8.4–10.5)
Chloride: 108 mEq/L (ref 96–112)
Creatinine, Ser: 0.72 mg/dL (ref 0.50–1.10)
GFR calc Af Amer: >90 mL/min (ref >90)
Glucose, Bld: 90 mg/dL (ref 70–99)

## 2013-05-14 LAB — CBC
HCT: 32.1 % — ABNORMAL LOW (ref 36.0–46.0)
Hemoglobin: 10.5 g/dL — ABNORMAL LOW (ref 12.0–15.0)
MCH: 29 pg (ref 26.0–34.0)
MCV: 88.7 fL (ref 78.0–100.0)
Platelets: 73 10*3/uL — ABNORMAL LOW (ref 150–400)
RBC: 3.62 MIL/uL — ABNORMAL LOW (ref 3.87–5.11)
RDW: 14.9 % (ref 11.5–15.5)
WBC: 8.6 10*3/uL (ref 4.0–10.5)

## 2013-05-14 MED ORDER — ACETAMINOPHEN 325 MG PO TABS: 650.0000 mg | ORAL_TABLET | ORAL | Status: AC | PRN

## 2013-05-14 MED ORDER — ATORVASTATIN CALCIUM 10 MG PO TABS: 10.0000 mg | ORAL_TABLET | Freq: Every day | ORAL | Status: AC

## 2013-05-14 MED ORDER — CIPROFLOXACIN HCL 500 MG PO TABS: 500.0000 mg | ORAL_TABLET | Freq: Two times a day (BID) | ORAL | Status: AC

## 2013-05-14 MED ORDER — OXYCODONE-ACETAMINOPHEN 5-325 MG PO TABS: 1.0000 | ORAL_TABLET | ORAL | Status: AC | PRN

## 2013-05-14 MED ORDER — MORPHINE SULFATE ER 15 MG PO TBCR
15.0000 mg | EXTENDED_RELEASE_TABLET | Freq: Two times a day (BID) | ORAL | Status: DC
  Administered 2013-05-14 – 2013-05-15 (×3): 15 mg via ORAL
  Filled 2013-05-14 (×3): qty 1

## 2013-05-14 MED ORDER — ENSURE COMPLETE PO LIQD: 237.0000 mL | Freq: Two times a day (BID) | ORAL | Status: AC

## 2013-05-14 MED ORDER — MORPHINE SULFATE ER 15 MG PO TBCR: 15.0000 mg | EXTENDED_RELEASE_TABLET | Freq: Two times a day (BID) | ORAL | Status: AC

## 2013-05-14 MED ORDER — ASPIRIN 325 MG PO TABS: 325.0000 mg | ORAL_TABLET | Freq: Every day | ORAL | Status: AC

## 2013-05-14 NOTE — Consult Note (Signed)
Physical Medicine and Rehabilitation Consult Reason for Consult: CVA Referring Physician: [facility]   HPI: [name] is a 79 y.o. right-handed female with history of hypertension, COPD, metastatic non-small cell lung cancer on chemotherapy followed by Dr. Arbutus Ped. Admitted 05/10/2013 with right-sided weakness and slurred speech after being found down. MRI of the brain showed multiple acute subacute infarcts throughout the cerebellum bilaterally and slightly larger on the left. Supratentorial bilateral infarcts greater on the left with largest area of acute infarction involving the posterior left operculum region. MRA of the head with atherosclerotic type changes. Echocardiogram with ejection fraction of 60% without emboli. Carotid Dopplers with no ICA stenosis. Patient did not receive TPA. Neurology services consulted placed on aspirin for CVA prophylaxis. Findings of elevated troponin 1.20. Cardiology services consulted. Patient on no chest pain. In light of metastatic cancer and CVA no further recommendations made on pursuing any invasive cardiac procedures. Urine culture greater than 100,000 Escherichia coli maintained on Cipro. Physical and occupational therapy evaluations completed 05/12/2013 with recommendations for physical medicine rehabilitation consult to consider inpatient rehabilitation services.  Patient in room with 2 daughters. They both told me she knows them by name however she could only name one of them.  Review of Systems  Unable to perform ROS: language   Past Medical History  Diagnosis Date  . Lung mass   . History of radiation therapy 7/31/212-03/08/2011    right upper lobe lung adenocarcinoma  . COPD (chronic obstructive pulmonary disease)   . Hypertension   . Hypercholesterolemia   . Osteoporosis   . History of chemotherapy     carboplatin/paclitaxel  . lung ca July 2012    , right upper lobe  . Liver metastases dx'd 06/16/12   Past Surgical History  Procedure  Laterality Date  . Abdominal hysterectomy      s/p for fibroid tumor  . Portacath placement      right ij   . Back surgery     Family History  Problem Relation Age of Onset  . Breast cancer Mother   . Breast cancer Sister     2 sisters   . Ovarian cancer Maternal Aunt   . Breast cancer Maternal Grandmother    Social History:  reports that she quit smoking about 6 years ago. Her smoking use included Cigarettes. She has a 50 pack-year smoking history. She does not have any smokeless tobacco history on file. She reports that she drinks alcohol. She reports that she does not use illicit drugs. Allergies:  Allergies  Allergen Reactions  . Aleve [Naproxen Sodium] Rash  . Zyban [Bupropion Hcl] Rash   Medications Prior to Admission  Medication Sig Dispense Refill  . diphenoxylate-atropine (LOMOTIL) 2.5-0.025 MG per tablet Take 1 tablet by mouth 4 (four) times daily as needed for diarrhea or loose stools.      . lidocaine-prilocaine (EMLA) cream Apply topically as needed. Use as directed  30 g  1  . oxyCODONE-acetaminophen (PERCOCET/ROXICET) 5-325 MG per tablet Take 1 tablet by mouth every 4 (four) hours as needed for severe pain.  30 tablet  0  . prochlorperazine (COMPAZINE) 10 MG tablet Take 1 tablet (10 mg total) by mouth every 6 (six) hours as needed.  30 tablet  1  . TARCEVA 100 MG tablet TAKE 1 TABLET BY MOUTH DAILY. TAKE ON AN EMPTY STOMACH 1 HOUR BEFORE MEALS OR 2 HOURS AFTER  30 tablet  2  . traMADol (ULTRAM) 50 MG tablet Take 1 tablet (50 mg total)  by mouth every 6 (six) hours as needed. Maximum dose= 8 tablets per day  60 tablet  0    Home: Home Living Family/patient expects to be discharged to:: Private residence Living Arrangements: Spouse/significant other;Other relatives Available Help at Discharge: Family;Available 24 hours/day Type of Home: House (multilevel hosue per pt's daughter, basement and upstairs) Home Access: Stairs to enter [employer] of Steps:  1 Entrance Stairs-Rails: None Home Layout: Multi-level Alternate Level Stairs-Number of Steps: 12 Alternate Level Stairs-Rails: Right Home Equipment: None  Lives With: Spouse  Functional History: Prior Function Vocation: Retired Functional Status:  Mobility: Bed Mobility Bed Mobility: Supine to Sit;Sitting - Scoot to Edge of Bed Supine to Sit: 1: +2 Total assist;HOB flat Supine to Sit: Patient Percentage: 40% Sitting - Scoot to Edge of Bed: 3: Mod assist Transfers Transfers: Sit to Stand;Stand to Sit;Stand Pivot Transfers Sit to Stand: 1: +2 Total assist;From bed;From chair/3-in-1 Sit to Stand: Patient Percentage: 60% Stand to Sit: 3: Mod assist Stand Pivot Transfers: 1: +2 Total assist Stand Pivot Transfers: Patient Percentage: 60% Ambulation/Gait Ambulation/Gait Assistance: Not tested (comment)    ADL: ADL Eating/Feeding: Minimal assistance Where Assessed - Eating/Feeding: Chair Grooming: Brushing hair;Wash/dry face;Moderate assistance Where Assessed - Grooming: Supported sitting Upper Body Bathing: Moderate assistance Where Assessed - Upper Body Bathing: Supported sitting Lower Body Bathing: +2 Total assistance Where Assessed - Lower Body Bathing: Supported sit to stand Upper Body Dressing: Maximal assistance Where Assessed - Upper Body Dressing: Supported sitting Lower Body Dressing: +2 Total assistance Where Assessed - Lower Body Dressing: Supported sit to Pharmacist, hospital: +2 Total assistance Toilet Transfer Method: Sit to stand;Stand pivot Acupuncturist: Gaffer Method: Not assessed Equipment Used: Gait belt Transfers/Ambulation Related to ADLs: +2 total A (60%) for stand pivot and sit to stand transfer with subsequent attempts requiring Mod/Max A.  ADL Comments: OT performed hand over hand assist to incorporate Rt hand into functional tasks (washing face and brushing hair). Educated family to have Rt arm supported on  pillow. Family educated to try to assist pt in using RUE into functional tasks.  Cognition: Cognition Overall Cognitive Status: Impaired/Different from baseline Orientation Level: Oriented to person;Oriented to place;Disoriented to time;Disoriented to situation Attention: Sustained;Focused Focused Attention: Appears intact Sustained Attention: Impaired Memory:  (recognized spouse/daughter) Awareness: Appears intact Behaviors: Perseveration;Impulsive Cognition Arousal/Alertness: Awake/alert (initally lethargic but maintain arousal with activity) Behavior During Therapy: WFL for tasks assessed/performed Overall Cognitive Status: Impaired/Different from baseline Area of Impairment: Following commands Following Commands: Follows one step commands inconsistently;Follows multi-step commands inconsistently (multimodal cues to assess activities) General Comments: Pt with expressive aphasia making it hard to determine higher level cognition at this time  Blood pressure 112/65, pulse 109, temperature 97.4 F (36.3 C), temperature source Oral, resp. rate 20, height 5\' 4"  (1.626 m), weight 56.7 kg (125 lb), SpO2 90.00%. Physical Exam  Vitals reviewed. HENT:  Head: Normocephalic.  Eyes:  Pupils round and reactive to light   Neck: Normal range of motion. Neck supple. No thyromegaly present.  Cardiovascular:  Cardiac rate controlled  Respiratory: Effort normal and breath sounds normal. No respiratory distress.  GI: Soft. Bowel sounds are normal. She exhibits no distension.  Neurological: She is alert.   Patient with expressive receptive deficits. She was able to provide some yes and no appropriately but inconsistent. She did follow simple commands  Skin: Skin is warm and dry.  Neuro:  Eyes without evidence of nystagmus  Tone is normal without evidence of spasticity Cerebellar  exam unable to perform on right side secondary to weakness   poor sitting balance   Motor strength is 2-/5 in R  deltoid, biceps, triceps, finger flexors and extensors, wrist flexors and extensors, hip flexors, knee flexors and extensors, ankle dorsiflexors, plantar flexors, invertors and , toe flexors and extensors, 4/5 on Left side  Sensory exam unable to cooperate secondary to language Cranial nerves II- Visual fields are reduced on R confrontation testing, III- no evidence of ptosis, upward, downward and medial gaze intact IV- no  head tilt V- no facial numbness or masseter weakness VI- no pupil abduction weakness VII- no facial droop, good lid closure VII- ? auditory acuity IX- no pharygeal weakness,  X- no pharyngeal weakness, no hoarseness XI- no trap or SCM weakness XII- no glossal weakness    Results for orders placed during the hospital encounter of 05/10/13 (from the past 24 hour(s))  CBC     Status: Abnormal   Collection Time    05/13/13 11:50 AM      Result Value Range   WBC 11.3 (*) 4.0 - 10.5 K/uL   RBC 3.99  3.87 - 5.11 MIL/uL   Hemoglobin 11.4 (*) 12.0 - 15.0 g/dL   HCT 04.5 (*) 40.9 - 81.1 %   MCV 88.7  78.0 - 100.0 fL   MCH 28.6  26.0 - 34.0 pg   MCHC 32.2  30.0 - 36.0 g/dL   RDW 91.4  78.2 - 95.6 %   Platelets 75 (*) 150 - 400 K/uL  BASIC METABOLIC PANEL     Status: Abnormal   Collection Time    05/13/13 11:50 AM      Result Value Range   Sodium 140  135 - 145 mEq/L   Potassium 3.6  3.5 - 5.1 mEq/L   Chloride 106  96 - 112 mEq/L   CO2 23  19 - 32 mEq/L   Glucose, Bld 106 (*) 70 - 99 mg/dL   BUN 21  6 - 23 mg/dL   Creatinine, Ser 2.13  0.50 - 1.10 mg/dL   Calcium 8.4  8.4 - 08.6 mg/dL   GFR calc non Af Amer 81 (*) >90 mL/min   GFR calc Af Amer >90  >90 mL/min   No results found.  Assessment/Plan: Diagnosis: right homonymous hemianopsia right-handed sensory right sided weakness as well as receptive aphasia and apraxia,  this may represent the watershed infarcts on the left rather than the cerebellar  1. Does the need for close, 24 hr/day medical supervision  in concert with the patient's rehab needs make it unreasonable for this patient to be served in a less intensive setting? Yes 2. Co-Morbidities requiring supervision/potential complications:  COPD, lung cancer with bone metastasis  3. Due to bladder management, bowel management, safety, skin/wound care, disease management, medication administration, pain management and patient education, does the patient require 24 hr/day rehab nursing? Yes 4. Does the patient require coordinated care of a physician, rehab nurse, PT (1-2 hrs/day, 5 days/week), OT (1-2 hrs/day, 5 days/week) and SLP (0.51 hrs/day, 5 days/week) to address physical and functional deficits in the context of the above medical diagnosis(es)? Yes Addressing deficits in the following areas: balance, endurance, locomotion, strength, transferring, bowel/bladder control, bathing, dressing, feeding, grooming, toileting, cognition and speech 5. Can the patient actively participate in an intensive therapy program of at least 3 hrs of therapy per day at least 5 days per week? Yes 6. The potential for patient to make measurable gains while on inpatient rehab  is fair Anticipated functional outcomes upon discharge from inpatient rehab are min assist mobility min 7.  with PT, min A ADL with OT, improve attn to Right as well as receptive adn exp language with SLP. 8. Estimated rehab length of stay to reach the above functional goals is: 2-3 weeks 9. Does the patient have adequate social supports to accommodate these discharge functional goals? Yes 10. Anticipated D/C setting: Home 11. Anticipated post D/C treatments: HH therapy 12. Overall Rehab/Functional Prognosis: good  RECOMMENDATIONS: This patient's condition is appropriate for continued rehabilitative care in the following setting: CIR Patient has agreed to participate in recommended program. Potentially Note that insurance prior authorization may be required for reimbursement for recommended  care.  Comment: may have poor endurance related to CA    05/14/2013

## 2013-05-14 NOTE — Telephone Encounter (Signed)
Pt's daughter Babette Relic called wanting to know if Dr Donnald Garre would come and see pt in the hospital.  Discuss concerns with daughter, Dr Donnald Garre spoke with Tammy over the phone and discussed concerns.  SLJ

## 2013-05-14 NOTE — Progress Notes (Signed)
Physical Therapy Treatment Patient Details Name: [name] MRN: [id number] DOB: [birth year] Today's Date: 05/14/2013 Time: 4132-4401 PT Time Calculation (min): 29 min  PT Assessment / Plan / Recommendation  History of Present Illness [name] is a 79 y.o. female who has known lung and liver cancer. 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 Patient is followed by Dr. Arbutus Ped as out patient . Family noted patient to be normal last night but this am when family went to check on her they were unable to open the bedroom due to patient behind door and not able to understand them. On consultation patient is globally aphasic, alert and shows a right facial droop and arm>leg weakness. Patient was on no ASA prior to hospitalization.    PT Comments   Pt slowly progressing with mobility today.  Pt required frequent breaks due to fatigue however remained motivated to participate in PT.  With therex, pt needed verbal and tactile cues to complete each rep.  Pt would benefit from CIR to increase functional independence.  Pt's family very supportive and motivating.  Follow Up Recommendations  CIR     Does the patient have the potential to tolerate intense rehabilitation     Barriers to Discharge        Equipment Recommendations       Recommendations for Other Services Rehab consult  Frequency Min 4X/week   Progress towards PT Goals Progress towards PT goals: Progressing toward goals  Plan Current plan remains appropriate    Precautions / Restrictions Precautions Precautions: Fall Restrictions Weight Bearing Restrictions: No   Pertinent Vitals/Pain Pt denied pain      Mobility  Bed Mobility Bed Mobility: Supine to Sit;Sitting - Scoot to Edge of Bed Supine to Sit: 1: +2 Total assist;HOB flat Supine to Sit: Patient Percentage: 40% Sitting - Scoot to Edge of Bed: 2: Max assist Details for Bed Mobility Assistance: Poor  trunk control, requiring assistance and cues to maintain posture.  Assistance to come to sitting and to scoot to EOB. Transfers Transfers: Sit to Stand;Stand to Sit;Stand Pivot Transfers Sit to Stand: 1: +2 Total assist Sit to Stand: Patient Percentage: 70% Stand to Sit: 3: Mod assist Stand Pivot Transfers: 1: +2 Total assist Stand Pivot Transfers: Patient Percentage: 70% Details for Transfer Assistance: Cues for technique.  Assistance to come to full standing posture and cues to take steps towards chair; no buckling noted    Exercises General Exercises - Lower Extremity Ankle Circles/Pumps: AROM;AAROM;Both;10 reps Long Arc Quad: AROM;Both;10 reps;AAROM Hip Flexion/Marching: AROM;AAROM;Both;10 reps   PT Diagnosis:    PT Problem List:   PT Treatment Interventions:     PT Goals (current goals can now be found in the care plan section)    Visit Information  Last PT Received On: 05/14/13 Assistance Needed: +2 History of Present Illness: [name] is a 80 y.o. female who has known lung and liver cancer. 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 Patient is followed by Dr. Arbutus Ped as out patient . Family noted patient to be normal last night but this am when family went to check on her they were unable to open the bedroom due to patient behind door and not able to understand them. On consultation patient is globally aphasic, alert and shows a right facial droop and arm>leg weakness. Patient  was on no ASA prior to hospitalization.     Subjective Data      Cognition  Cognition Arousal/Alertness: Awake/alert Behavior During Therapy: WFL for tasks assessed/performed Overall Cognitive Status: Impaired/Different from baseline Area of Impairment: Following commands;Attention Current Attention Level: Focused Following Commands: Follows one step commands inconsistently;Follows multi-step commands inconsistently General  Comments: Pt with expressive aphasia making it hard to determine higher level cognition at this time    Balance     End of Session PT - End of Session Equipment Utilized During Treatment: Gait belt Activity Tolerance: Patient limited by fatigue;Patient tolerated treatment well Patient left: in chair;with call bell/phone within reach;with family/visitor present Nurse Communication: Mobility status   GP     Ernestina Columbia, SPTA 05/14/2013, 12:47 PM

## 2013-05-14 NOTE — Progress Notes (Signed)
Speech Language Pathology Treatment: Cognitive-Linquistic  Patient Details Name: [name] MRN: [id number] DOB: 1932-03-17 Today's Date: 05/14/2013 Time: 7829-5621 SLP Time Calculation (min): 20 min  Assessment / Plan / Recommendation Clinical Impression  Skilled treatment session focused on addressing language goals.  SLP facilitated session with automatic verbal and naming tasks; written cues were effective at increasing independence with automatic verbal sequences.  SLP also provided semantic and phonemic cues in addition to written cues for naming accuracy.  Patient with accurate spontaneous greetings during session.  SLP educated daughters regarding effective cuing techniques; continue with education.       HPI HPI: 79 y.o. female with history of metastatic non-small cell lung cancer, adenocarcinoma with positive EGFR mutation, a liver metastasis,? Right shoulder metastasis, on Tarceva, COPD, hypertension, hypercholesterolemia, former smoker, presented to the ED after she was found down in her room. History was provided by granddaughter and spouse.  Pt was found down on the carpeted floor a few feet away from her bed. No report documented regarding fall, syncope, chest pain or palpitations. She was found to have facial asymmetry, confusion, difficult to understand speech and right-sided weakness. mild transaminitis, tropon.  Since arrival speech had apparently improved per documentation.  MRI revealed Multiple acute/subacute infarcts throughout the cerebellum bilaterally and slightly larger on the left. Supratentorial bilateral infarcts greater on the left with largest area of acute infarction involving the posterior left operculum region. There is a parasagittal distribution of some of the infarcts involving the frontal-parietal lobe which may indicate a component of watershed infarction. However, with involvement of multiple vascular territories, embolus is of concern.  Pt resides with spouse  [name] who recently had heart valve surgery and is not in position to provide large amount of physical assist per daughter [name].  Speech evaluation was ordered.   Pertinent Vitals none  SLP Plan  Continue with current plan of care    Recommendations                Follow up Recommendations:  (TBD with endurance) Plan: Continue with current plan of care    GO    Charlane Ferretti., CCC-SLP [phone]  Corian Handley 05/14/2013, 4:36 PM

## 2013-05-14 NOTE — Discharge Summary (Signed)
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 Lung mass   . History of radiation therapy 7/31/212-03/08/2011    right upper lobe lung adenocarcinoma  . COPD (chronic obstructive pulmonary disease)   . Hypertension   . Hypercholesterolemia   . Osteoporosis   . History of chemotherapy     carboplatin/paclitaxel  . lung ca July 2012    , right upper lobe  . Liver metastases dx'd 06/16/12   Past Surgical History  Procedure Laterality Date  . Abdominal hysterectomy      s/p for fibroid tumor  . Portacath placement      right ij   . Back surgery      Discharge Medications:   Medication List         acetaminophen 325 MG tablet  Commonly known as:  TYLENOL  Take 2 tablets (650 mg total) by mouth every 4 (four) hours as needed for mild pain (or temp >/= 99.5 F).     aspirin 325 MG tablet  Take 1 tablet (325 mg total) by mouth daily.     atorvastatin 10 MG tablet  Commonly known as:  LIPITOR  Take 1 tablet (10 mg total) by mouth daily at 6 PM.     ciprofloxacin 500 MG tablet  Commonly known as:  CIPRO  Take 1 tablet (500 mg total) by mouth 2 (two) times daily.     diphenoxylate-atropine 2.5-0.025 MG per tablet  Commonly known as:  LOMOTIL  Take 1  tablet by mouth 4 (four) times daily as needed for diarrhea or loose stools.     feeding supplement (ENSURE COMPLETE) Liqd  Take 237 mLs by mouth 2 (two) times daily between meals.     lidocaine-prilocaine cream  Commonly known as:  EMLA  Apply topically as needed. Use as directed     morphine 15 MG 12 hr tablet  Commonly known as:  MS CONTIN  Take 1 tablet (15 mg total) by mouth every 12 (twelve) hours.     oxyCODONE-acetaminophen 5-325 MG per tablet  Commonly known as:  PERCOCET/ROXICET  Take 1 tablet by mouth every 4 (four) hours as needed for severe pain.     prochlorperazine 10 MG tablet  Commonly known as:  COMPAZINE  Take 1 tablet (10 mg total) by mouth every 6 (six) hours as needed.     TARCEVA 100 MG tablet  Generic drug:  erlotinib  TAKE 1 TABLET BY MOUTH DAILY. TAKE ON AN EMPTY STOMACH 1 HOUR BEFORE MEALS OR 2 HOURS AFTER     traMADol 50 MG tablet  Commonly known as:  ULTRAM  Take 1 tablet (50 mg total) by mouth every 6 (six) hours as needed. Maximum dose= 8 tablets per day        Hospital Procedures: Dg Chest 2 View  05/11/2013   CLINICAL DATA:  Gray  and right-sided weakness.  EXAM: CHEST  2 VIEW  COMPARISON:  Chest CT 02/15/2013  FINDINGS: Right IJ porta catheter in stable position. Right perihilar mass with surrounding interstitial changes in this patient with treated lung cancer. No recent radiography for direct comparison. Small right pleural effusion which is chronic. Left lung is well-aerated.  IMPRESSION: 1. No active cardiopulmonary disease. 2. Chronic, small right pleural effusion. 3. Treated right lung cancer which is followed by CT.   Electronically Signed   By: Tiburcio Pea M.D.   On: 05/11/2013 05:31   Ct Head Wo Contrast  05/10/2013   CLINICAL DATA:  Weakness  EXAM: CT HEAD WITHOUT CONTRAST  CT CERVICAL SPINE WITHOUT CONTRAST  TECHNIQUE: Multidetector CT imaging of the head and cervical spine was performed following the standard protocol without  intravenous contrast. Multiplanar CT image reconstructions of the cervical spine were also generated.  COMPARISON:  MRI 09/30/2011  FINDINGS: CT HEAD FINDINGS  Generalized atrophy. Chronic microvascular ischemic change in the white matter. Chronic infarct left cerebellum not present previously.  Negative for acute infarct, hemorrhage, or mass lesion. No edema or midline shift. Negative for skull lesion.  CT CERVICAL SPINE FINDINGS  Normal cervical alignment. Negative for fracture or mass lesion. Mild disc and mild facet degeneration.  IMPRESSION: Atrophy and chronic ischemia.  No acute abnormality.  Mild cervical degenerative change.  No acute bony abnormality.   Electronically Signed   By: Marlan Palau M.D.   On: 05/10/2013 09:04   Ct Cervical Spine Wo Contrast  05/10/2013   CLINICAL DATA:  Weakness  EXAM: CT HEAD WITHOUT CONTRAST  CT CERVICAL SPINE WITHOUT CONTRAST  TECHNIQUE: Multidetector CT imaging of the head and cervical spine was performed following the standard protocol without intravenous contrast. Multiplanar CT image reconstructions of the cervical spine were also generated.  COMPARISON:  MRI 09/30/2011  FINDINGS: CT HEAD FINDINGS  Generalized atrophy. Chronic microvascular ischemic change in the white matter. Chronic infarct left cerebellum not present previously.  Negative for acute infarct, hemorrhage, or mass lesion. No edema or midline shift. Negative for skull lesion.  CT CERVICAL SPINE FINDINGS  Normal cervical alignment. Negative for fracture or mass lesion. Mild disc and mild facet degeneration.  IMPRESSION: Atrophy and chronic ischemia.  No acute abnormality.  Mild cervical degenerative change.  No acute bony abnormality.   Electronically Signed   By: Marlan Palau M.D.   On: 05/10/2013 09:04   Mr Maxine Glenn Head Wo Contrast  05/10/2013   CLINICAL DATA:  Weakness.  History of lung cancer.  EXAM: MRI HEAD WITHOUT CONTRAST  MRA HEAD WITHOUT CONTRAST  TECHNIQUE: Multiplanar, multiecho pulse  sequences of the brain and surrounding structures were obtained without intravenous contrast. Angiographic images of the head were obtained using MRA technique without contrast.  COMPARISON:  05/10/2013 CT.  09/30/2011 brain MR.  FINDINGS: MRI HEAD FINDINGS  Multiple acute/subacute infarcts throughout the cerebellum bilaterally and slightly larger on the left. Supratentorial bilateral infarcts greater on the left with largest area of acute infarction involving the posterior left operculum region. There is a parasagittal distribution of some of the infarcts involving the frontal-parietal lobe which may indicate a component of watershed infarction. However, with involvement of multiple vascular territories, embolus is of concern.  No intracranial hemorrhage.  Mild small vessel disease type changes.  Global atrophy without hydrocephalus.  No intracranial mass lesion noted on this unenhanced exam.  MRA HEAD FINDINGS  Ectatic distal vertical cervical segment of the left internal carotid artery. No  significant stenosis of either internal carotid artery or carotid terminus.  Aplastic A1 segment right anterior cerebral artery.  No significant stenosis of the M1 segment of the middle cerebral artery on either side.  Decrease number of visualized middle cerebral artery branch vessels. The middle cerebral artery branches which are visualized are narrowed and irregular with narrowing more notable on the left.  Moderate stenosis portions of the A2 segment of the right anterior cerebral artery.  Fetal type contribution to the posterior circulation.  Left vertebral artery is small and ends in a posterior inferior cerebellar artery distribution. Mild to moderate narrowing of portions of the distal left vertebral artery and the left posterior inferior cerebellar artery.  Mild to moderate narrowing of the distal right vertebral artery and proximal basilar artery. Mild narrowing of portions of the right posterior inferior cerebellar  artery.  Non visualization of the anterior inferior cerebellar arteries.  Moderate narrowing superior cerebral artery bilaterally.  There is a bulge along the superior margin of the the basilar tip. This may reflect result of fetal type origin of the posterior cerebral arteries. Small aneurysm at this level is not excluded although felt less likely consideration.  Mild to moderate narrowing distal branches of the posterior cerebral artery bilaterally.  IMPRESSION: Multiple acute/subacute infarcts throughout the cerebellum bilaterally and slightly larger on the left. Supratentorial bilateral infarcts greater on the left with largest area of acute infarction involving the posterior left operculum region. There is a parasagittal distribution of some of the infarcts involving the frontal-parietal lobe which may indicate a component of watershed infarction. However, with involvement of multiple vascular territories, embolus is of concern.  Intracranial atherosclerotic type changes as noted above most notable involving branch vessels and posterior circulation.  These results were called by telephone at the time of interpretation on 05/10/2013 at 2:27 PM to Dr. Linwood Dibbles , who verbally acknowledged these results.   Electronically Signed   By: Bridgett Larsson M.D.   On: 05/10/2013 14:37   Mr Laqueta Jean ZO Contrast  05/10/2013   CLINICAL DATA:  Weakness.  History of lung cancer.  EXAM: MRI HEAD WITHOUT CONTRAST  MRA HEAD WITHOUT CONTRAST  TECHNIQUE: Multiplanar, multiecho pulse sequences of the brain and surrounding structures were obtained without intravenous contrast. Angiographic images of the head were obtained using MRA technique without contrast.  COMPARISON:  05/10/2013 CT.  09/30/2011 brain MR.  FINDINGS: MRI HEAD FINDINGS  Multiple acute/subacute infarcts throughout the cerebellum bilaterally and slightly larger on the left. Supratentorial bilateral infarcts greater on the left with largest area of acute infarction  involving the posterior left operculum region. There is a parasagittal distribution of some of the infarcts involving the frontal-parietal lobe which may indicate a component of watershed infarction. However, with involvement of multiple vascular territories, embolus is of concern.  No intracranial hemorrhage.  Mild small vessel disease type changes.  Global atrophy without hydrocephalus.  No intracranial mass lesion noted on this unenhanced exam.  MRA HEAD FINDINGS  Ectatic distal vertical cervical segment of the left internal carotid artery. No significant stenosis of either internal carotid artery or carotid terminus.  Aplastic A1 segment right anterior cerebral artery.  No significant stenosis of the M1 segment of the middle cerebral artery on either side.  Decrease number of visualized middle cerebral artery branch vessels. The middle cerebral artery branches which are visualized are narrowed and irregular with narrowing more notable on the left.  Moderate stenosis portions of the A2 segment of the right anterior  cerebral artery.  Fetal type contribution to the posterior circulation.  Left vertebral artery is small and ends in a posterior inferior cerebellar artery distribution. Mild to moderate narrowing of portions of the distal left vertebral artery and the left posterior inferior cerebellar artery.  Mild to moderate narrowing of the distal right vertebral artery and proximal basilar artery. Mild narrowing of portions of the right posterior inferior cerebellar artery.  Non visualization of the anterior inferior cerebellar arteries.  Moderate narrowing superior cerebral artery bilaterally.  There is a bulge along the superior margin of the the basilar tip. This may reflect result of fetal type origin of the posterior cerebral arteries. Small aneurysm at this level is not excluded although felt less likely consideration.  Mild to moderate narrowing distal branches of the posterior cerebral artery bilaterally.   IMPRESSION: Multiple acute/subacute infarcts throughout the cerebellum bilaterally and slightly larger on the left. Supratentorial bilateral infarcts greater on the left with largest area of acute infarction involving the posterior left operculum region. There is a parasagittal distribution of some of the infarcts involving the frontal-parietal lobe which may indicate a component of watershed infarction. However, with involvement of multiple vascular territories, embolus is of concern.  Intracranial atherosclerotic type changes as noted above most notable involving branch vessels and posterior circulation.  These results were called by telephone at the time of interpretation on 05/10/2013 at 2:27 PM to Dr. Linwood Dibbles , who verbally acknowledged these results.   Electronically Signed   By: Bridgett Larsson M.D.   On: 05/10/2013 14:37   Echocardiogram (11/20) - Left ventricle: The cavity size was normal. Wall thickness was normal. Systolic function was normal. The estimated ejection fraction was in the range of 55% to 60%. Although no diagnostic regional wall motion abnormality was identified, this possibility cannot be completely excluded on the basis of this study. - Aortic valve: Trivial regurgitation. - Tricuspid valve: Moderate regurgitation. - Pulmonary arteries: Systolic pressure was mildly increased. PA peak pressure: 44mm Hg (S). - Pericardium, extracardiac: There was a right pleural Effusion.  Carotid Dopplers (11/20): Findings suggest 1-39% internal carotid artery stenosis bilaterally. Vertebral arteries are patent with antegrade flow.   History of Present Illness (From Admission H&P): [name] is a 79 y.o. female with history of metastatic non-small cell lung cancer, adenocarcinoma with positive EGFR mutation, a liver metastasis,? Right shoulder metastasis, on Tarceva, COPD, hypertension, hypercholesterolemia, former smoker, presented to the ED after she was found down in her room.  Patient is unable to provide history secondary to abnormal speech. History is provided by patient's granddaughter and spouse who live with the patient at home and are currently at bedside. She was last seen normal at 9:45 PM on 05/09/13. She is an independent lady and fully coherent. At approximately 6:50 AM on 05/10/13, granddaughter heard knocking sound coming out of patient's room. When she went in, she found patient lying down on the carpeted floor a few feet away from her bed. No report regarding fall, syncope, chest pain or palpitations. She was found to have facial asymmetry, confusion, difficult to understand speech and right-sided weakness. She was transported to the ED where CT head and neck was negative, mild transaminitis, troponin 1.84. Neuro hospitalist have consulted and are requesting MRI brain with contrast rule out Gray versus brain metastasis. Patient has passed bedside swallow screen. Hospitalist admission requested. Since arrival in the ED, speech is apparently improved.   Hospital Course: Mrs. Faraci was admitted to a medical bed to the  hospitalist service. She was evaluated by neurology and cardiology.  She was started on aspirin for antiplatelet therapy. MRI confirmed multiple embolic strokes. This was felt to be secondary to a hypercoagulable state in the setting of her metastatic lung cancer. The possibility of marantic endocarditis was entertained; however, Dr. Pearlean Brownie felt the appearance is most consistent with cardiac emboli.  Secondary evaluation included telemetry monitoring, carotid Dopplers that showed no significant stenosis, echocardiogram with no identifiable source of embolus. Her hemoglobin A1c was normal. LDL was 115 so she was started on Lipitor. We considered a TEE but felt that this study would not change her management as she is a poor candidate for anticoagulation with her underlying malignancy, ongoing chemotherapy and thrombocytopenia.  She was evaluated by physical  therapy, occupational therapy, and speech therapy who felt that she would benefit from rehabilitation-inpatient rehabilitation consult is in place for possible transfer there versus skilled nursing facility rehabilitation.  In addition, the patient had an increase in her troponin to 7.55 on 11/20. This was consistent with a non-ST elevation MI in setting of her Gray. Echocardiogram showed no wall motion abnormalities and a preserved ejection fraction. Cardiology recommended medical therapy. Consideration of a beta blocker in the future if her blood pressure remained stable. This was not started due to the concern of hypotension in the setting of her acute Gray.  The patient continues to have right shoulder pain. This is the site of a newly diagnosed metastasis.  Dr. Shirline Frees has been notified of her admission and hopefully will help to direct further care. I started her on MS Contin for better pain control with continued Percocet as needed.multiple physicians discussed prognosis with the patient and her family, stating that her Gray was a secondary effect of the underlying malignancy/hypercoagulability.  She is tolerating Tarceva fairly well except for diarrhea as this will be continued. We will consider transitioning to comfort care depending on her recovery from her Gray.  Of note, I assumed care of the patient on 11/22 from the hospital is given my long-term relationship with her as her primary care position. This was to assist the patient and her family through the complex decision-making with her multiple medical issues. I appreciate the care of the hospitalists and subspecialists involved her care.  Day of Discharge Exam BP 112/65  Pulse 109  Temp(Src) 97.4 F (36.3 C) (Oral)  Resp 20  Ht 5\' 4"  (1.626 m)  Wt 56.7 kg (125 lb)  BMI 21.45 kg/m2  SpO2 90%  Physical Exam: General appearance: alert, no distress and expressive aphasia Eyes: no scleral icterus Throat: oropharynx moist  without erythema Resp: clear to auscultation bilaterally Cardio: regular rate and rhythm and tachycardia with frequent ectopy (sinus rhythm with PACs on monitor) GI: soft, non-tender; bowel sounds normal; no masses,  no organomegaly Extremities: no clubbing, cyanosis or edema  Discharge Labs:  Recent Labs  05/13/13 1150 05/14/13 0600  NA 140 141  K 3.6 3.3*  CL 106 108  CO2 23 24  GLUCOSE 106* 90  BUN 21 19  CREATININE 0.69 0.72  CALCIUM 8.4 8.2*     Recent Labs  05/13/13 1150 05/14/13 0600  WBC 11.3* 8.6  HGB 11.4* 10.5*  HCT 35.4* 32.1*  MCV 88.7 88.7  PLT 75* 73*   Lab Results  Component Value Date   INR 1.22 05/10/2013   INR 0.99 01/12/2011   INR 0.95 12/30/2010    Recent Labs  05/11/13 1412  TROPONINI 2.80*  Peak troponin 7.55  Lipid Panel     Component Value Date/Time   CHOL 193 05/11/2013 0348   TRIG 101 05/11/2013 0348   HDL 58 05/11/2013 0348   CHOLHDL 3.3 05/11/2013 0348   VLDL 20 05/11/2013 0348   LDLCALC 115* 05/11/2013 0348    A1C 5.4   Urine Culture    Component Value Date/Time   SDES URINE, CLEAN CATCH 05/10/2013 0911   SPECREQUEST NONE 05/10/2013 0911   CULT  Value: ESCHERICHIA COLI Performed at [facility] 05/10/2013 0911   REPTSTATUS 05/12/2013 FINAL 05/10/2013 0911      Discharge instructions:      Future Appointments Provider Department Dept Phone   May 25, 2013 10:00 AM Wl-Mr 1 [facility] [phone]   Patient to arrive 15 minutes prior to appointment time.   05-25-13 2:30 PM [facility]-Radonc Nurse [facility] RADIATION ONCOLOGY [phone]   2013/05/25 3:00 PM Oneita Hurt, Gray [facility] RADIATION ONCOLOGY [phone]   05/03/2013 12:00 PM Wl-Ct 2 [facility] IMAGING [phone]   Patient to arrive 15 minutes prior to appointment time.   05/22/2013 10:30 AM Krista [address] [facility] MEDICAL ONCOLOGY [phone]    05/22/2013 11:00 AM Si Gaul, Gray [facility] MEDICAL ONCOLOGY [phone]   01/10/2014 1:00 PM Mc-Cv Us[facility] [place] [facility]ARDIOV[facility]ULAR [address] [phone]   01/10/2014 1:30 PM Sherren Kerns, Gray [facility] [phone]      Disposition: to inpatient rehab or skilled nursing rehab pending bed availability  Follow-up Appts: Follow-up with Dr. Clelia Croft at [facility] within 1 week of discharge from Rehab. Call for appointment.  Condition on Discharge: stable, poor prognosis  Tests Needing Follow-up: None  Discharge Diet: Cardiac Prudent  Time with discharge activities: 35 minutes  Signed: Anish Vana,W [name] 05/14/2013, 7:05 AM

## 2013-05-14 NOTE — Progress Notes (Signed)
Seen and agreed 05/14/2013 Theadora Noyes [place] PTA [phone] pager [phone] office    

## 2013-05-14 NOTE — Progress Notes (Signed)
Stroke Team Progress Note  HISTORY  [name] is a 79 y.o. female who has known lung and liver cancer. 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 Patient is followed by Dr. Arbutus Ped as out patient . Family noted patient to be normal on the evening of 05/09/2013 but the following morning when family went to check on her they were unable to open the bedroom due to patient behind door and not able to understand them. On consultation patient was noted to be globally aphasic, alert with a right facial droop and arm>leg weakness. Patient was on no ASA prior to hospitalization.   Date last known well: Date: 05/09/2013  Time last known well: Time: 21:30  tPA Given: No: out of the window   SUBJECTIVE Dr. Pearlean Brownie spoke with the family today regarding her condition and prognosis. The patient remains globally aphasic. No change.  OBJECTIVE Most recent Vital Signs: Filed Vitals:   05/13/13 2135 05/14/13 0138 05/14/13 0553 05/14/13 0929  BP: 134/77 133/73 112/65 128/78  Pulse: 107 107 109 106  Temp: 97.5 F (36.4 C) 97.9 F (36.6 C) 97.4 F (36.3 C) 97.7 F (36.5 C)  TempSrc: Oral Oral Oral Oral  Resp: 20 20 20 18   Height:      Weight:      SpO2: 91% 90% 90% 89%   CBG (last 3)  No results found for this basename: GLUCAP,  in the last 72 hours  IV Fluid Intake:   . sodium chloride 75 mL/hr at 05/14/13 0454    MEDICATIONS  . aspirin  300 mg Rectal Daily   Or  . aspirin  325 mg Oral Daily  . atorvastatin  10 mg Oral q1800  . ciprofloxacin  500 mg Oral BID  . erlotinib  100 mg Oral QAC breakfast  . feeding supplement (ENSURE COMPLETE)  237 mL Oral BID BM  . morphine  15 mg Oral Q12H  . sodium chloride  10-40 mL Intracatheter Q12H   PRN:  acetaminophen, acetaminophen, diphenoxylate-atropine, oxyCODONE-acetaminophen, sodium chloride, traMADol  Diet:  Cardiac thin liquids Activity:  Up with assistance DVT Prophylaxis:   SCDs  CLINICALLY SIGNIFICANT STUDIES Basic Metabolic Panel:   Recent Labs Lab 05/13/13 1150 05/14/13 0600  NA 140 141  K 3.6 3.3*  CL 106 108  CO2 23 24  GLUCOSE 106* 90  BUN 21 19  CREATININE 0.69 0.72  CALCIUM 8.4 8.2*   Liver Function Tests:   Recent Labs Lab 05/10/13 0858 05/11/13 0348  AST 60* 57*  ALT 57* 51*  ALKPHOS 177* 169*  BILITOT 1.4* 1.5*  PROT 6.3 5.9*  ALBUMIN 2.8* 2.6*   CBC:   Recent Labs Lab 05/10/13 0858  05/13/13 1150 05/14/13 0600  WBC 11.5*  --  11.3* 8.6  NEUTROABS 10.5*  --   --   --   HGB 12.6  < > 11.4* 10.5*  HCT 38.3  < > 35.4* 32.1*  MCV 87.4  --  88.7 88.7  PLT 73*  --  75* 73*  < > = values in this interval not displayed. Coagulation:   Recent Labs Lab 05/10/13 0858  LABPROT 15.1  INR 1.22   Cardiac Enzymes:   Recent Labs Lab 05/10/13 0858 05/10/13 1524 05/10/13 2210 05/11/13 1412  CKTOTAL 155  --   --   --   TROPONINI 1.84* 7.51* 7.55* 2.80*   Urinalysis:   Recent Labs Lab 05/10/13  0911  COLORURINE RED*  LABSPEC 1.022  PHURINE 6.0  GLUCOSEU NEGATIVE  HGBUR LARGE*  BILIRUBINUR SMALL*  KETONESUR 15*  PROTEINUR 30*  UROBILINOGEN 1.0  NITRITE NEGATIVE  LEUKOCYTESUR MODERATE*   Lipid Panel    Component Value Date/Time   CHOL 193 05/11/2013 0348   TRIG 101 05/11/2013 0348   HDL 58 05/11/2013 0348   CHOLHDL 3.3 05/11/2013 0348   VLDL 20 05/11/2013 0348   LDLCALC 115* 05/11/2013 0348   HgbA1C  Lab Results  Component Value Date   HGBA1C 5.4 05/11/2013    Urine Drug Screen:     Component Value Date/Time   LABOPIA NONE DETECTED 05/10/2013 0911   COCAINSCRNUR NONE DETECTED 05/10/2013 0911   LABBENZ NONE DETECTED 05/10/2013 0911   AMPHETMU NONE DETECTED 05/10/2013 0911   THCU NONE DETECTED 05/10/2013 0911   LABBARB NONE DETECTED 05/10/2013 0911    Alcohol Level:   Recent Labs Lab 05/10/13 0858  ETH <11    Dg Chest 2 View 05/11/2013    1. No active cardiopulmonary disease. 2.  Chronic, small right pleural effusion. 3. Treated right lung cancer which is followed by CT.     Ct Head Wo Contrast 05/10/2013    Atrophy and chronic ischemia.  No acute abnormality.  Mild cervical degenerative change.  No acute bony abnormality.    Ct Cervical Spine Wo Contrast 05/10/2013    Atrophy and chronic ischemia.  No acute abnormality.  Mild cervical degenerative change.  No acute bony abnormality.      MRI Head Wo Contrast 05/10/2013    Multiple acute/subacute infarcts throughout the cerebellum bilaterally and slightly larger on the left. Supratentorial bilateral infarcts greater on the left with largest area of acute infarction involving the posterior left operculum region. There is a parasagittal distribution of some of the infarcts involving the frontal-parietal lobe which may indicate a component of watershed infarction. However, with involvement of multiple vascular territories, embolus is of concern.  Intracranial atherosclerotic type changes as noted above most notable involving branch vessels and posterior circulation.      MRA Brain W Wo Contrast 05/10/2013    Decrease number of visualized middle cerebral artery branch vessels. The middle cerebral artery branches which are visualized are narrowed and irregular with narrowing more notable on the left.  Moderate stenosis portions of the A2 segment of the right anterior cerebral artery.  Fetal type contribution to the posterior circulation.  Left vertebral artery is small and ends in a posterior inferior cerebellar artery distribution. Mild to moderate narrowing of portions of the distal left vertebral artery and the left posterior inferior cerebellar artery.  Mild to moderate narrowing of the distal right vertebral artery and proximal basilar artery. Mild narrowing of portions of the right posterior inferior cerebellar artery.  Non visualization of the anterior inferior cerebellar arteries.  Moderate narrowing superior cerebral  artery bilaterally.  There is a bulge along the superior margin of the the basilar tip. This may reflect result of fetal type origin of the posterior cerebral arteries. Small aneurysm at this level is not excluded although felt less likely consideration.  Mild to moderate narrowing distal branches of the posterior cerebral artery bilaterally.  IMPRESSION:  Intracranial atherosclerotic type changes as noted above most notable involving branch vessels and posterior circulation.       2D Echocardiogram  ejection fraction 55-60% - no cardiac source of emboli identified.  Carotid Doppler  Findings suggest 1-39% internal carotid artery stenosis bilaterally. Vertebral arteries are patent with  antegrade flow.   EKG  sinus tachycardia rate 107 beats per minute   Therapy Recommendations CIR recommended with 24-hour per day supervision  Physical Exam   Mental Status:  Alert,  Mild expressive aphasia. Able to follow simple commands.  Cranial Nerves:  II: Discs flat bilaterally; Visual fields blinks to threat bilaterally, pupils equal, round, reactive to light and accommodation  III,IV, VI: ptosis not present, extra-ocular motions intact bilaterally  V,VII: smile asymmetric on the right, facial light touch sensation normal bilaterally  VIII: hearing normal bilaterally  IX,X: gag reflex present  XI: bilateral shoulder shrug  XII: midline tongue extension without atrophy or fasciculations  Motor:  Right : Upper extremity 1/5 Left: Upper extremity 5/5  Lower extremity 3/5 Lower extremity 5/5  Tone and bulk:normal tone throughout; no atrophy noted  Sensory: Pinprick and light touch intact throughout, bilaterally  Deep Tendon Reflexes:  Right: Upper Extremity Left: Upper extremity  biceps (C-5 to C-6) 2/4 biceps (C-5 to C-6) 2/4  tricep (C7) 2/4 triceps (C7) 2/4  Brachioradialis (C6) 2/4 Brachioradialis (C6) 2/4  Lower Extremity Lower Extremity  quadriceps (L-2 to L-4) 2/4 quadriceps (L-2 to L-4) 2/4   Achilles (S1) 2/4 Achilles (S1) 2/4  Plantars:  Right: up going Left: downgoing  Cerebellar:  Unable to obtain due to aphasia  Gait: not assessed  CV: pulses palpable throughout    ASSESSMENT [name] is a 79 y.o. female presenting with global aphasia and right hemiparesis. TPA was not given as the patient was passed the window for treatment.. Imaging revealed multiple acute/subacute infarcts throughout the cerebellum bilaterally and slightly larger on the left. Supratentorial bilateral infarcts greater on the left with largest area of acute infarction involving the posterior left operculum region.  Infarcts felt to be embolic likley due to hypercoagulability from cancer. Marantic endocarditis possible too. Doubt brain metastasis.On no antithrombotics prior to admission. Now on aspirin 325 mg orally every day for secondary stroke prevention. Patient with resultant globally aphasia and right hemiparesis.  .   Lung cancer with liver metastasis status post chemotherapy  Hypertension  Hyperlipidemia - currently on Lipitor  Urinary tract infection - Cipro  Elevated troponin enzymes - cardiology following  Renal insufficiency  Tachycardia  Hospital day # 4  TREATMENT/PLAN  Continue aspirin 325 mg orally every day for secondary stroke prevention.TEE will add to dx but unlikely to change treatment as she is poor anticoagulation candidate  Prognosis poor given advanced metastattic lung cancer.   See cardiology note 05/11/2013 and 05/12/2013 regarding thoughts on TEE.   CIR/SNF  Long discussion with husband and daughter about stroke, prognosis, etiology, life expectancy and answered questions  Have patient follow up with Dr. Pearlean Brownie in 2 months in stroke clinic  Larita Fife D. Manson Passey, [facility], MBA, MHA [facility] Pager: [phone] 05/14/2013 10:50 AM  I have personally obtained a history, examined the patient, evaluated imaging results, and formulated the  assessment and plan of care. I agree with the above.  Delia Heady

## 2013-05-14 NOTE — Progress Notes (Signed)
CSW spoke with the facility [name] at [facility] and they are considering offering a bed), however one of Pt's medication has a high cost and [name] is checking to see if they can cover that medication.   Facility stated that Pt will need to pay there own way to receive Radiation Therapy and wanted CSW to relay that information to the family.   CSW to met with Pt and family.   CSW will continue to follow Pt for d/c planning.    [name] [facility]  [address];  [phone] Phone: [phone]

## 2013-05-14 NOTE — Progress Notes (Signed)
CSW spoke with [name] at [facility] to see if they could take a look at Pt info sent for bed request.   [name] stated that she will take a look at Pt info and contact CSW back for possible bed offer as this is Pt's first choice for placement.   CSW will continue to follow Pt for d/c planning.    [name] [facility]  [address];  [phone] Phone: [phone]

## 2013-05-15 ENCOUNTER — Inpatient Hospital Stay (HOSPITAL_COMMUNITY)
Admission: RE | Admit: 2013-05-15 | Discharge: 2013-05-16 | DRG: 945 | Disposition: A | Discharge status: Other Acute Inpatient Hospital | Payer: Medicare Other | Source: Intra-hospital | Attending: Physical Medicine & Rehabilitation | Admitting: Physical Medicine & Rehabilitation

## 2013-05-15 ENCOUNTER — Encounter: Payer: Self-pay | Admitting: Radiation Oncology

## 2013-05-15 DIAGNOSIS — I6340 Cerebral infarction due to embolism of unspecified cerebral artery: Secondary | ICD-10-CM | POA: Diagnosis not present

## 2013-05-15 DIAGNOSIS — J4489 Other specified chronic obstructive pulmonary disease: Secondary | ICD-10-CM

## 2013-05-15 DIAGNOSIS — I248 Other forms of acute ischemic heart disease: Secondary | ICD-10-CM

## 2013-05-15 DIAGNOSIS — R4789 Other speech disturbances: Secondary | ICD-10-CM

## 2013-05-15 DIAGNOSIS — A498 Other bacterial infections of unspecified site: Secondary | ICD-10-CM | POA: Diagnosis not present

## 2013-05-15 DIAGNOSIS — R319 Hematuria, unspecified: Secondary | ICD-10-CM

## 2013-05-15 DIAGNOSIS — G8110 Spastic hemiplegia affecting unspecified side: Secondary | ICD-10-CM

## 2013-05-15 DIAGNOSIS — C7951 Secondary malignant neoplasm of bone: Secondary | ICD-10-CM

## 2013-05-15 DIAGNOSIS — R29810 Facial weakness: Secondary | ICD-10-CM | POA: Diagnosis not present

## 2013-05-15 DIAGNOSIS — R488 Other symbolic dysfunctions: Secondary | ICD-10-CM | POA: Diagnosis not present

## 2013-05-15 DIAGNOSIS — I10 Essential (primary) hypertension: Secondary | ICD-10-CM | POA: Diagnosis not present

## 2013-05-15 DIAGNOSIS — N390 Urinary tract infection, site not specified: Secondary | ICD-10-CM | POA: Diagnosis not present

## 2013-05-15 DIAGNOSIS — C787 Secondary malignant neoplasm of liver and intrahepatic bile duct: Secondary | ICD-10-CM | POA: Diagnosis not present

## 2013-05-15 DIAGNOSIS — E7800 Pure hypercholesterolemia, unspecified: Secondary | ICD-10-CM

## 2013-05-15 DIAGNOSIS — I639 Cerebral infarction, unspecified: Secondary | ICD-10-CM | POA: Diagnosis present

## 2013-05-15 DIAGNOSIS — G8190 Hemiplegia, unspecified affecting unspecified side: Secondary | ICD-10-CM | POA: Diagnosis not present

## 2013-05-15 DIAGNOSIS — T451X5A Adverse effect of antineoplastic and immunosuppressive drugs, initial encounter: Secondary | ICD-10-CM

## 2013-05-15 DIAGNOSIS — J449 Chronic obstructive pulmonary disease, unspecified: Secondary | ICD-10-CM | POA: Diagnosis not present

## 2013-05-15 DIAGNOSIS — H53469 Homonymous bilateral field defects, unspecified side: Secondary | ICD-10-CM | POA: Diagnosis not present

## 2013-05-15 DIAGNOSIS — D6959 Other secondary thrombocytopenia: Secondary | ICD-10-CM | POA: Diagnosis not present

## 2013-05-15 DIAGNOSIS — Z923 Personal history of irradiation: Secondary | ICD-10-CM | POA: Diagnosis not present

## 2013-05-15 DIAGNOSIS — R4701 Aphasia: Secondary | ICD-10-CM

## 2013-05-15 DIAGNOSIS — Z5189 Encounter for other specified aftercare: Principal | ICD-10-CM

## 2013-05-15 DIAGNOSIS — I6330 Cerebral infarction due to thrombosis of unspecified cerebral artery: Secondary | ICD-10-CM

## 2013-05-15 DIAGNOSIS — M810 Age-related osteoporosis without current pathological fracture: Secondary | ICD-10-CM

## 2013-05-15 DIAGNOSIS — Z85118 Personal history of other malignant neoplasm of bronchus and lung: Secondary | ICD-10-CM

## 2013-05-15 DIAGNOSIS — Z87891 Personal history of nicotine dependence: Secondary | ICD-10-CM

## 2013-05-15 DIAGNOSIS — Z9221 Personal history of antineoplastic chemotherapy: Secondary | ICD-10-CM

## 2013-05-15 DIAGNOSIS — Z79899 Other long term (current) drug therapy: Secondary | ICD-10-CM | POA: Diagnosis not present

## 2013-05-15 DIAGNOSIS — I2489 Other forms of acute ischemic heart disease: Secondary | ICD-10-CM

## 2013-05-15 LAB — BASIC METABOLIC PANEL
BUN: 18 mg/dL (ref 6–23)
Calcium: 8.2 mg/dL — ABNORMAL LOW (ref 8.4–10.5)
Chloride: 107 mEq/L (ref 96–112)
Creatinine, Ser: 0.82 mg/dL (ref 0.50–1.10)
GFR calc Af Amer: 77 mL/min — ABNORMAL LOW (ref >90)
GFR calc non Af Amer: 66 mL/min — ABNORMAL LOW (ref >90)
Sodium: 140 mEq/L (ref 135–145)

## 2013-05-15 LAB — CBC
HCT: 31.4 % — ABNORMAL LOW (ref 36.0–46.0)
MCH: 28.3 pg (ref 26.0–34.0)
MCHC: 31.5 g/dL (ref 30.0–36.0)
MCV: 89.7 fL (ref 78.0–100.0)
Platelets: 58 10*3/uL — ABNORMAL LOW (ref 150–400)
RDW: 15.1 % (ref 11.5–15.5)

## 2013-05-15 MED ORDER — MORPHINE SULFATE ER 15 MG PO TBCR
15.0000 mg | EXTENDED_RELEASE_TABLET | Freq: Two times a day (BID) | ORAL | Status: DC
  Administered 2013-05-15 – 2013-05-16 (×2): 15 mg via ORAL
  Filled 2013-05-15 (×2): qty 1

## 2013-05-15 MED ORDER — PROCHLORPERAZINE EDISYLATE 5 MG/ML IJ SOLN
5.0000 mg | Freq: Four times a day (QID) | INTRAMUSCULAR | Status: DC | PRN
  Filled 2013-05-15: qty 2

## 2013-05-15 MED ORDER — METHOCARBAMOL 500 MG PO TABS: 500.0000 mg | ORAL_TABLET | Freq: Four times a day (QID) | ORAL | Status: DC | PRN

## 2013-05-15 MED ORDER — GUAIFENESIN-DM 100-10 MG/5ML PO SYRP: 5.0000 mL | ORAL_SOLUTION | Freq: Four times a day (QID) | ORAL | Status: DC | PRN

## 2013-05-15 MED ORDER — ATORVASTATIN CALCIUM 10 MG PO TABS
10.0000 mg | ORAL_TABLET | Freq: Every day | ORAL | Status: DC
Start: 2013-05-16 — End: 2013-05-16
  Administered 2013-05-16: 10 mg via ORAL
  Filled 2013-05-15: qty 1

## 2013-05-15 MED ORDER — TRAMADOL HCL 50 MG PO TABS
50.0000 mg | ORAL_TABLET | Freq: Four times a day (QID) | ORAL | Status: DC | PRN
  Administered 2013-05-16: 50 mg via ORAL
  Filled 2013-05-15: qty 1

## 2013-05-15 MED ORDER — DIPHENHYDRAMINE HCL 12.5 MG/5ML PO ELIX: 12.5000 mg | ORAL_SOLUTION | Freq: Four times a day (QID) | ORAL | Status: DC | PRN

## 2013-05-15 MED ORDER — CIPROFLOXACIN HCL 500 MG PO TABS
500.0000 mg | ORAL_TABLET | Freq: Two times a day (BID) | ORAL | Status: DC
  Administered 2013-05-15 – 2013-05-16 (×2): 500 mg via ORAL
  Filled 2013-05-15 (×4): qty 1

## 2013-05-15 MED ORDER — ERLOTINIB HCL 100 MG PO TABS
100.0000 mg | ORAL_TABLET | Freq: Every day | ORAL | Status: DC
  Administered 2013-05-16: 100 mg via ORAL
  Filled 2013-05-15 (×2): qty 1

## 2013-05-15 MED ORDER — DIPHENOXYLATE-ATROPINE 2.5-0.025 MG PO TABS: 1.0000 | ORAL_TABLET | Freq: Four times a day (QID) | ORAL | Status: DC | PRN

## 2013-05-15 MED ORDER — PROCHLORPERAZINE 25 MG RE SUPP
12.5000 mg | Freq: Four times a day (QID) | RECTAL | Status: DC | PRN
  Filled 2013-05-15: qty 1

## 2013-05-15 MED ORDER — ALUM & MAG HYDROXIDE-SIMETH 200-200-20 MG/5ML PO SUSP: 30.0000 mL | ORAL | Status: DC | PRN

## 2013-05-15 MED ORDER — ENSURE COMPLETE PO LIQD
237.0000 mL | Freq: Two times a day (BID) | ORAL | Status: DC
  Administered 2013-05-16 (×2): 237 mL via ORAL

## 2013-05-15 MED ORDER — PROCHLORPERAZINE MALEATE 5 MG PO TABS
5.0000 mg | ORAL_TABLET | Freq: Four times a day (QID) | ORAL | Status: DC | PRN
  Administered 2013-05-16: 10 mg via ORAL
  Filled 2013-05-15: qty 2

## 2013-05-15 MED ORDER — OXYCODONE-ACETAMINOPHEN 5-325 MG PO TABS
1.0000 | ORAL_TABLET | Freq: Three times a day (TID) | ORAL | Status: DC | PRN
  Administered 2013-05-16: 1 via ORAL
  Filled 2013-05-15: qty 1

## 2013-05-15 MED ORDER — ASPIRIN EC 325 MG PO TBEC
325.0000 mg | DELAYED_RELEASE_TABLET | Freq: Every day | ORAL | Status: DC
  Administered 2013-05-16: 325 mg via ORAL
  Filled 2013-05-15 (×3): qty 1

## 2013-05-15 NOTE — Progress Notes (Signed)
Clinical Social Work Department BRIEF PSYCHOSOCIAL ASSESSMENT 05/15/2013  Patient:  [name], [name]     Account Number:  [id number]     Admit date:  05/14/2013  Clinical Social Worker:  [name], CLINICAL SOCIAL WORKER  Date/Time:  05/15/2013 05:30 AM  Referred by:  Physician  Date Referred:  05/14/2013 Referred for  SNF Placement   Other Referral:   Interview type:  Patient Other interview type:   CSW also met with both daughters outside of the room. [name])    PSYCHOSOCIAL DATA Living Status:  HUSBAND Admitted from facility:   Level of care:   Primary support name:  [name]  [phone] Primary support relationship to patient:  SPOUSE Degree of support available:   Pt has a good support system    CURRENT CONCERNS Current Concerns  Post-Acute Placement   Other Concerns:    SOCIAL WORK ASSESSMENT / PLAN CSW met with the Pt at the bedside to discuss d/c planning for SNF. CSW introduced self and explained that the MD thought that SNF placement would be best at this time (versus CIR) due to the Radiation that she would need to be receiving and that the Pt would need to travel to and from the rehab to [facility] in order to complete that treatment. Pt voiced understanding and is in agreeement to going to a SNF. CSW encouraged Pt to consider multiple placement options so that her choices were not limited, Pt was in agreement to send out information to [facility].  CSW also explained that the MD had stated that Pt would like [facility] and that CSW did have an oportunity to speak with [name] at the facility. CSW explained about the travel cost and that it would need to be paid by the family. Pt voiced understanding.    CSW met with daughters outside of the room and explained the above. CSW also provided bed offers and encouraged the family to visit whichever nursing facilities they were interested in and that a decision would need to be made  today. Both were in agreement to d/c plan. CSW to follow for d/c planning.   Assessment/plan status:  Information/Referral to [facility] Other assessment/ plan:   Information/referral to community resources:   CSW provided Pt and family with SNF listing and bed offers    PATIENT'S/FAMILY'S RESPONSE TO PLAN OF CARE: Pt and family were appreciatvie for assistance.      [name] [facility]  [address];  [phone] Phone: [phone]

## 2013-05-15 NOTE — PMR Pre-admission (Signed)
PMR Admission Coordinator Pre-Admission Assessment  Patient: [name] is an 79 y.o., female MRN: [id number] DOB: 1931/12/12 Height: 5\' 4"  (162.6 cm) Weight: 56.7 kg (125 lb)              Insurance Information HMO:    PPO: Yes     PCP:       IPA:       80/20:       OTHER: Group # [id number]  PRIMARY: AARP Medicare Complete      Policy#: [id number]      Subscriber: Edgardo Roys CM Name: Bertram Denver      Phone#:  [phone]     Fax#:   Pre-Cert#: [id number]      Employer: Retired Benefits:  Phone #: [phone]     Name: Chip Boer. Date: 06/21/12     Deduct: $290(met)      Out of Pocket Max: $3290(met $925.81)      Life Max: unlimited CIR: 80% w/auth      SNF: $50 days 1-20, $152 days 21-100 Outpatient: 80%     Co-Pay: 20% Home Health: 100%      Co-Pay: none DME: 80%     Co-Pay: 20% Providers: in Surveyor, quantity Information   Name Relation Home Work Mobile   [place] Spouse [phone]     [place]ieve, [place] Daughter [phone]  [phone]     Current Medical History  Patient Admitting Diagnosis: Right homonymous hemianopsia right-handed sensory right sided weakness as well as receptive aphasia and apraxia, this may represent the watershed infarcts on the left rather than the cerebellar     History of Present Illness: A 79 y.o. right-handed female with history of hypertension, COPD, metastatic non-small cell lung cancer with ? Liver mets as well as ?right shoulder mets (large destructive lesion of glenoid) on chemotherapy followed by Dr. Arbutus Ped. Admitted 05/10/2013 after found down with right-sided weakness, right facial droop and abnormal speech. MRI of the brain showed multiple acute subacute infarcts throughout the cerebellum bilaterally and slightly larger on the left. Supratentorial bilateral infarcts greater on the left with largest area of acute infarction involving the posterior left operculum region. MRA of the head with atherosclerotic type  changes. Echocardiogram with ejection fraction of 60% without emboli. Carotid Dopplers with no ICA stenosis. Patient did not receive TPA. Neurology following and patient on ASA for embolic infarcts due to hyper-coagulopathy.  Patient with elevated troponin due to NSTEMI with question of cardioembolic event. Patient not coumadin candidate and TEE would not alter treatment strategy. UCS positive for Escherichia coli and patient started on Cipro. Now requiring oxygen--CXR with new opacity right base with question of subpulmonic effusion. She has required addition of MS contin for pain management of right shoulder--?single XRT planned for palliation of symptoms. Patient continues with right hemiparesis with global aphasia requiring verbal and tactile cues. Therapy team recommending CIR and patient admitted today.     Total: 14=NIH  Past Medical History  Past Medical History  Diagnosis Date  . Lung mass   . History of radiation therapy 7/31/212-03/08/2011    right upper lobe lung adenocarcinoma  . COPD (chronic obstructive pulmonary disease)   . Hypertension   . Hypercholesterolemia   . Osteoporosis   . History of chemotherapy     carboplatin/paclitaxel  . lung ca July 2012    , right upper lobe  . Liver metastases dx'd 06/16/12    Family History  family history includes Breast cancer in her maternal grandmother,  mother, and sister; Ovarian cancer in her maternal aunt.  Prior Rehab/Hospitalizations:  None   Current Medications  Current facility-administered medications:0.9 %  sodium chloride infusion, , Intravenous, Continuous, W Buren Kos, MD, Last Rate: 75 mL/hr at 05/14/13 0454;  acetaminophen (TYLENOL) suppository 650 mg, 650 mg, Rectal, Q4H PRN, Elease Etienne, MD;  acetaminophen (TYLENOL) tablet 650 mg, 650 mg, Oral, Q4H PRN, Elease Etienne, MD, 650 mg at 05/12/13 1012 aspirin suppository 300 mg, 300 mg, Rectal, Daily, Elease Etienne, MD, 300 mg at 05/10/13 1740;  aspirin tablet  325 mg, 325 mg, Oral, Daily, Elease Etienne, MD, 325 mg at 05/15/13 4098;  atorvastatin (LIPITOR) tablet 10 mg, 10 mg, Oral, q1800, Maryann Mikhail, DO, 10 mg at 05/14/13 1715;  ciprofloxacin (CIPRO) tablet 500 mg, 500 mg, Oral, BID, W Buren Kos, MD, 500 mg at 05/15/13 1191 diphenoxylate-atropine (LOMOTIL) 2.5-0.025 MG per tablet 1 tablet, 1 tablet, Oral, QID PRN, Elease Etienne, MD;  erlotinib (TARCEVA) tablet 100 mg, 100 mg, Oral, QAC breakfast, Elease Etienne, MD, 100 mg at 05/15/13 0827;  feeding supplement (ENSURE COMPLETE) (ENSURE COMPLETE) liquid 237 mL, 237 mL, Oral, BID BM, W Buren Kos, MD, 237 mL at 05/14/13 1447 morphine (MS CONTIN) 12 hr tablet 15 mg, 15 mg, Oral, Q12H, W Buren Kos, MD, 15 mg at 05/15/13 0950;  oxyCODONE-acetaminophen (PERCOCET/ROXICET) 5-325 MG per tablet 1 tablet, 1 tablet, Oral, Q8H PRN, Elease Etienne, MD, 1 tablet at 05/15/13 [phone];  sodium chloride 0.9 % injection 10-40 mL, 10-40 mL, Intracatheter, Q12H, W Buren Kos, MD;  sodium chloride 0.9 % injection 10-40 mL, 10-40 mL, Intracatheter, PRN, [name], MD traMADol Janean Sark) tablet 50 mg, 50 mg, Oral, Q6H PRN, Elease Etienne, MD, 50 mg at 05/14/13 2029  Patients Current Diet: Cardiac  Precautions / Restrictions Precautions Precautions: Fall Restrictions Weight Bearing Restrictions: No   Prior Activity Level Limited Community (1-2x/wk): Went out 1-3 X a week to MD appts, shopping, grocery shopping, out to eat  Journalist, newspaper / Equipment Home Assistive Devices/Equipment: None Home Equipment: None  Prior Functional Level Prior Function Level of Independence: Independent  Current Functional Level Cognition  Overall Cognitive Status: Impaired/Different from baseline Current Attention Level: Focused Orientation Level: Oriented to person;Oriented to place;Disoriented to time;Disoriented to situation Following Commands: Follows one step commands inconsistently;Follows multi-step  commands inconsistently General Comments: Pt with expressive aphasia making it hard to determine higher level cognition at this time Attention: Sustained;Focused Focused Attention: Appears intact Sustained Attention: Impaired Memory:  (recognized spouse/daughter) Awareness: Appears intact Behaviors: Perseveration;Impulsive    Extremity Assessment (includes Sensation/Coordination)          ADLs  Eating/Feeding: Minimal assistance Where Assessed - Eating/Feeding: Chair Grooming: Brushing hair;Wash/dry face;Moderate assistance Where Assessed - Grooming: Supported sitting Upper Body Bathing: Moderate assistance Where Assessed - Upper Body Bathing: Supported sitting Lower Body Bathing: +2 Total assistance Lower Body Bathing: Patient Percentage: 10% Where Assessed - Lower Body Bathing: Supported sit to stand Upper Body Dressing: Maximal assistance Where Assessed - Upper Body Dressing: Supported sitting Lower Body Dressing: +2 Total assistance Lower Body Dressing: Patient Percentage: 0% Where Assessed - Lower Body Dressing: Supported sit to Pharmacist, hospital: +2 Total assistance Toilet Transfer: Patient Percentage: 60% Toilet Transfer Method: Sit to stand;Stand pivot Acupuncturist: Materials engineer and Hygiene: +2 Total assistance Toileting - Clothing Manipulation and Hygiene: Patient Percentage: 0% Where Assessed - Toileting Clothing Manipulation and Hygiene: Sit to stand from  3-in-1 or toilet Tub/Shower Transfer Method: Not assessed Equipment Used: Gait belt Transfers/Ambulation Related to ADLs: +2 total A (60%) for stand pivot and sit to stand transfer with subsequent attempts requiring Mod/Max A.  ADL Comments: OT performed hand over hand assist to incorporate Rt hand into functional tasks (washing face and brushing hair). Educated family to have Rt arm supported on pillow. Family educated to try to assist pt in using RUE into  functional tasks.    Mobility  Bed Mobility: Supine to Sit;Sitting - Scoot to Edge of Bed Supine to Sit: 1: +2 Total assist Supine to Sit: Patient Percentage: 40% Sitting - Scoot to Edge of Bed: 2: Max assist    Transfers  Transfers: Sit to Stand;Stand to [employer] Transfers Sit to Stand: 2: Max assist Sit to Stand: Patient Percentage: 70% Stand to Sit: 2: Max Multimedia programmer Transfers: 2: Max Multimedia programmer Transfers: Patient Percentage: 70%    Ambulation / Gait / Stairs / Psychologist, prison and probation services  Ambulation/Gait Ambulation/Gait Assistance: Not tested (comment)    Posture / Balance Static Sitting Balance Static Sitting - Balance Support: Feet supported Static Sitting - Level of Assistance: 3: Mod assist Static Sitting - Comment/# of Minutes:   EOB with lateral lean to the right. While supported sitting on BSC with pillows positioned on the Right patient was able to sit min guard/standby.   Static Standing Balance Static Standing - Balance Support: During functional activity Static Standing - Level of Assistance: 2: Max assist Static Standing - Comment/# of Minutes:   40 secondas static standing with max assist and RLE blocking. Patient was able to maintain weight bearing with max assist from face to face position.   Dynamic Standing Balance Dynamic Standing - Balance Support: During functional activity Dynamic Standing - Level of Assistance: 2: Max assist Dynamic Standing - Balance Activities: Lateral lean/weight shifting Dynamic Standing - Comments: patient able to laterally lean during step initiation while in standing position with max support from face to face. RLE block to control for buckling.     Special needs/care consideration BiPAP/CPAP No CPM No Continuous Drip IV No Dialysis No         Life Vest No Oxygen No Special Bed No Trach Size No Wound Vac (area) No      Skin No                              Bowel mgmt: Had BM 05/14/13 Bladder mgmt: Incontinent  of urine at times.  Using bedpan.  Has a UTI which is clearing. Diabetic mgmt No    Previous Home Environment Living Arrangements: Spouse/significant other;Other relatives  Lives With: Spouse Available Help at Discharge: Family;Available 24 hours/day Type of Home: House (multilevel hosue per pt's daughter, basement and upstairs) Home Layout: Multi-level Alternate Level Stairs-Rails: Right Alternate Level Stairs-Number of Steps: 12 Home Access: Stairs to enter Entrance Stairs-Rails: None Entrance Stairs-Number of Steps: 1 Bathroom Toilet: Standard Home Care Services: No  Discharge Living Setting Plans for Discharge Living Setting: Patient's home;House;Lives with (comment) (Lives with husband and grand daughter.) Type of Home at Discharge: House Discharge Home Layout: Multi-level;Laundry or work area in Theatre manager on main level;Bed/bath upstairs Alternate Teacher, music of Steps: 12 Discharge Home Access: Stairs to enter [employer] of Steps: 2 Does the patient have any problems obtaining your medications?: No  Social/Family/Support Systems Patient Roles: Spouse;Parent (Has a wife and 2 daughters.) Contact Information: Maisie Fus  [place] - spouse Anticipated Caregiver: Husband, grand daughter and daughters Anticipated Caregiver's Contact Information: Maisie Fus - husband [phone] Ability/Limitations of Caregiver: Husband retired and can assist.  Granddaughter is an Charity fundraiser and lives with patient. Caregiver Availability: 24/7 Discharge Plan Discussed with Primary Caregiver: Yes Is Caregiver In Agreement with Plan?: Yes Does Caregiver/Family have Issues with Lodging/Transportation while Pt is in Rehab?: No  Goals/Additional Needs Patient/Family Goal for Rehab: PT/OT min assist, ST supervision goals Expected length of stay: 2-3 weeks Cultural Considerations: [facility] - Community in [employer] Dietary Needs: Heart diet, thin liquids Equipment Needs:  TBD Pt/Family Agrees to Admission and willing to participate: Yes Program Orientation Provided & Reviewed with Pt/Caregiver Including Roles  & Responsibilities: Yes  Decrease burden of Care through IP rehab admission: N/A  Possible need for SNF placement upon discharge: Not planned  Patient Condition: This patient's condition remains as documented in the consult dated 05/14/13, in which the Rehabilitation Physician determined and documented that the patient's condition is appropriate for intensive rehabilitative care in an inpatient rehabilitation facility. Will admit to inpatient rehab today.  Preadmission Screen Completed By:  Trish Mage, 05/15/2013 1:50 PM ______________________________________________________________________   Discussed status with Dr. Wynn Banker on 05/15/13 at 1402 and received telephone approval for admission today.  Admission Coordinator:  Trish Mage, time1402/Date11/25/14

## 2013-05-15 NOTE — Progress Notes (Signed)
Seen and agreed 05/15/2013 Adriyanna Christians Elizabeth PTA [phone] pager [phone] office    

## 2013-05-15 NOTE — Progress Notes (Signed)
Rehab admissions - Evaluated for possible admission.  I met with patient, daughter, husband and grandson this am.  All are in favor of inpatient rehab admission.  I now have approval from [facility] for acute inpatient rehab admission for today.  Bed available and will admit to inpatient rehab today.  Call me for questions.  #[phone]

## 2013-05-15 NOTE — H&P (Signed)
Physical Medicine and Rehabilitation Admission H&P  Chief Complaint   Patient presents with   .  Right sided weakness and slurred speech in setting of metastatic lung cancer.   HPI: [name] is a 79 y.o. right-handed female with history of hypertension, COPD, metastatic non-small cell lung cancer with ? Liver mets as well as ?right shoulder mets (large destructive lesion of glenoid) on chemotherapy followed by Dr. Arbutus Ped. Admitted 05/10/2013 after found down with right-sided weakness, right facial droop and abnormal speech. MRI of the brain showed multiple acute subacute infarcts throughout the cerebellum bilaterally and slightly larger on the left. Supratentorial bilateral infarcts greater on the left with largest area of acute infarction involving the posterior left operculum region. MRA of the head with atherosclerotic type changes. Echocardiogram with ejection fraction of 60% without emboli. Carotid Dopplers with no ICA stenosis. Patient did not receive TPA. Neurology following and patient on ASA for embolic infarcts due to hyper-coagulopathy.  Patient with elevated troponin due to NSTEMI with question of cardioembolic event. Patient not coumadin candidate and TEE would not alter treatment strategy. UCS positive for Escherichia coli and patient started on Cipro. Now requiring oxygen--CXR with new opacity right base with question of subpulmonic effusion. She has required addition of MS contin for pain management of right shoulder--?single XRT planned for palliation of symptoms. Patient continues with right hemiparesis with global aphasia requiring verbal and tactile cues. Therapy team recommending CIR and patient admitted today.  Review of Systems  Unable to perform ROS: language   Past Medical History   Diagnosis  Date   .  Lung mass    .  History of radiation therapy  7/31/212-03/08/2011     right upper lobe lung adenocarcinoma   .  COPD (chronic obstructive pulmonary disease)    .   Hypertension    .  Hypercholesterolemia    .  Osteoporosis    .  History of chemotherapy      carboplatin/paclitaxel   .  lung ca  July 2012     , right upper lobe   .  Liver metastases  dx'd 06/16/12    Past Surgical History   Procedure  Laterality  Date   .  Abdominal hysterectomy       s/p for fibroid tumor   .  Portacath placement       right ij   .  Back surgery      Family History   Problem  Relation  Age of Onset   .  Breast cancer  Mother    .  Breast cancer  Sister      2 sisters   .  Ovarian cancer  Maternal Aunt    .  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 Husband with recent cardiac surgery and unable to provide physical assist. Daughters supportive . Per reports that she quit smoking about 6 years ago. Her smoking use included Cigarettes. She has a 50 pack-year smoking history. She does not have any smokeless tobacco history on file. Per reports that she does not drink alcohol. She reports that she does not use illicit drugs.  Allergies   Allergen  Reactions   .  Aleve [Naproxen Sodium]  Rash   .  Zyban [Bupropion Hcl]  Rash    Medications Prior to Admission   Medication  Sig  Dispense  Refill   .  diphenoxylate-atropine (LOMOTIL) 2.5-0.025 MG per tablet  Take 1 tablet by mouth 4 (four) times daily as needed for diarrhea or loose stools.     .  lidocaine-prilocaine (EMLA) cream  Apply topically as needed. Use as directed  30 g  1   .  prochlorperazine (COMPAZINE) 10 MG tablet  Take 1 tablet (10 mg total) by mouth every 6 (six) hours as needed.  30 tablet  1   .  TARCEVA 100 MG tablet  TAKE 1 TABLET BY MOUTH DAILY. TAKE ON AN EMPTY STOMACH 1 HOUR BEFORE MEALS OR 2 HOURS AFTER  30 tablet  2   .  traMADol (ULTRAM) 50 MG tablet  Take 1 tablet (50 mg total) by mouth every 6 (six) hours as needed. Maximum dose= 8 tablets per day  60 tablet  0   .  [DISCONTINUED]  oxyCODONE-acetaminophen (PERCOCET/ROXICET) 5-325 MG per tablet  Take 1 tablet by mouth every 4 (four) hours as needed for severe pain.  30 tablet  0    Home:  Home Living  Family/patient expects to be discharged to:: Private residence  Living Arrangements: Spouse/significant other;Other relatives  Available Help at Discharge: Family;Available 24 hours/day  Type of Home: House (multilevel hosue per pt's daughter, basement and upstairs)  Home Access: Stairs to enter  [employer] of Steps: 1  Entrance Stairs-Rails: None  Home Layout: Multi-level  Alternate Level Stairs-Number of Steps: 12  Alternate Level Stairs-Rails: Right  Home Equipment: None  Lives With: Spouse  Functional History:  Prior Function  Vocation: Retired  Functional Status:  Mobility:  Bed Mobility  Bed Mobility: Supine to Sit;Sitting - Scoot to Edge of Bed  Supine to Sit: 1: +2 Total assist;HOB flat  Supine to Sit: Patient Percentage: 40%  Sitting - Scoot to Edge of Bed: 2: Max assist  Transfers  Transfers: Sit to Stand;Stand to [employer] Transfers  Sit to Stand: 1: +2 Total assist  Sit to Stand: Patient Percentage: 70%  Stand to Sit: 3: Mod assist  Stand Pivot Transfers: 1: +2 Total assist  Stand Pivot Transfers: Patient Percentage: 70%  Ambulation/Gait  Ambulation/Gait Assistance: Not tested (comment)   ADL:  ADL  Eating/Feeding: Minimal assistance  Where Assessed - Eating/Feeding: Chair  Grooming: Brushing hair;Wash/dry face;Moderate assistance  Where Assessed - Grooming: Supported sitting  Upper Body Bathing: Moderate assistance  Where Assessed - Upper Body Bathing: Supported sitting  Lower Body Bathing: +2 Total assistance  Where Assessed - Lower Body Bathing: Supported sit to stand  Upper Body Dressing: Maximal assistance  Where Assessed - Upper Body Dressing: Supported sitting  Lower Body Dressing: +2 Total assistance  Where Assessed - Lower Body Dressing: Supported sit to Database administrator: +2 Total assistance  Toilet Transfer Method: Sit to stand;Stand pivot  Acupuncturist: Chief Financial Officer Method: Not assessed  Equipment Used: Gait belt  Transfers/Ambulation Related to ADLs: +2 total A (60%) for stand pivot and sit to stand transfer with subsequent attempts requiring Mod/Max A.  ADL Comments: OT performed hand over hand assist to incorporate Rt hand into functional tasks (washing face and brushing hair). Educated family to have Rt arm supported on pillow. Family educated to try to assist pt in using RUE into functional tasks.  Cognition:  Cognition  Overall Cognitive Status: Impaired/Different from baseline  Orientation Level: Oriented to person;Oriented to place;Disoriented to time;Disoriented to situation  Attention: Sustained;Focused  Focused Attention: Appears intact  Sustained Attention: Impaired  Memory: (recognized spouse/daughter)  Awareness: Appears intact  Behaviors: Perseveration;Impulsive  Cognition  Arousal/Alertness: Awake/alert  Behavior During Therapy: WFL for tasks assessed/performed  Overall Cognitive Status: Impaired/Different from baseline  Area of Impairment: Following commands;Attention  Current Attention Level: Focused  Following Commands: Follows one step commands inconsistently;Follows multi-step commands inconsistently  General Comments: Pt with expressive aphasia making it hard to determine higher level cognition at this time  Physical Exam:  Blood pressure 118/52, pulse 110, temperature 97.6 F (36.4 C), temperature source Oral, resp. rate 18, height 5\' 4"  (1.626 m), weight 56.7 kg (125 lb), SpO2 96.00%.  Physical Exam  Nursing note and vitals reviewed.  Constitutional: She appears well-developed and well-nourished.  HENT:  Head: Normocephalic and atraumatic.  Eyes: Conjunctivae are normal. Pupils are equal, round, and reactive to light.  Neck: Normal range of motion.  Cardiovascular:  Normal rate and regular rhythm.  Respiratory: Effort normal. Wheezes: auditory wheezes and expiratory wheezes on RUL.  GI: Soft. Bowel sounds are normal. She exhibits no distension. There is tenderness (non specific tenderness--not reliable due to aphasia. ).  Neurological: She is alert.  Alert. Able to state name. Global aphasia--Not accurate with Y/N questions. Unable to point or follow basic commands without visual cues-- component of apraxia. Frustration noted.  MMT limited by apraxia Motor strength is trace in R deltoid, biceps, triceps, finger flexors and extensors, wrist flexors and extensors, hip flexors, knee flexors and extensors, ankle dorsiflexors, plantar flexors, invertors and , toe flexors and extensors, 4/5 on Left side UE and LE, except grip    Sensory exam unable to cooperate secondary to language Cranial nerves II- Visual fields are reduced on R confrontation testing, III- no evidence of ptosis, upward, downward and medial gaze intact IV- no head tilt V- no facial numbness or masseter weakness VI- no pupil abduction weakness VII- no facial droop, good lid closure VII- ? auditory acuity IX- no pharygeal weakness,  X- no pharyngeal weakness, no hoarseness XI- no trap or SCM weakness XII- no glossal weakness   Skin: Skin is warm and dry.  Psychiatric: Her mood appears anxious.   Results for orders placed during the hospital encounter of 05/10/13 (from the past 48 hour(s))   CBC Status: Abnormal    Collection Time    05/13/13 11:50 AM   Result  Value  Range    WBC  11.3 (*)  4.0 - 10.5 K/uL    RBC  3.99  3.87 - 5.11 MIL/uL    Hemoglobin  11.4 (*)  12.0 - 15.0 g/dL    HCT  16.1 (*)  09.6 - 46.0 %    MCV  88.7  78.0 - 100.0 fL    MCH  28.6  26.0 - 34.0 pg    MCHC  32.2  30.0 - 36.0 g/dL    RDW  04.5  40.9 - 81.1 %    Platelets  75 (*)  150 - 400 K/uL    Comment:  CONSISTENT WITH PREVIOUS RESULT   BASIC METABOLIC PANEL Status: Abnormal    Collection Time     05/13/13 11:50 AM   Result  Value  Range    Sodium  140  135 - 145 mEq/L    Potassium  3.6  3.5 - 5.1 mEq/L    Chloride  106  96 - 112 mEq/L    CO2  23  19 - 32 mEq/L    Glucose, Bld  106 (*)  70 - 99 mg/dL    BUN  21  6 -  23 mg/dL    Creatinine, Ser  1.61  0.50 - 1.10 mg/dL    Calcium  8.4  8.4 - 10.5 mg/dL    GFR calc non Af Amer  81 (*)  >90 mL/min    GFR calc Af Amer  >90  >90 mL/min    Comment:  (NOTE)     The eGFR has been calculated using the CKD EPI equation.     This calculation has not been validated in all clinical situations.     eGFR's persistently <90 mL/min signify possible Chronic Kidney     Disease.   CBC Status: Abnormal    Collection Time    05/14/13 6:00 AM   Result  Value  Range    WBC  8.6  4.0 - 10.5 K/uL    RBC  3.62 (*)  3.87 - 5.11 MIL/uL    Hemoglobin  10.5 (*)  12.0 - 15.0 g/dL    HCT  09.6 (*)  04.5 - 46.0 %    MCV  88.7  78.0 - 100.0 fL    MCH  29.0  26.0 - 34.0 pg    MCHC  32.7  30.0 - 36.0 g/dL    RDW  40.9  81.1 - 91.4 %    Platelets  73 (*)  150 - 400 K/uL    Comment:  CONSISTENT WITH PREVIOUS RESULT   BASIC METABOLIC PANEL Status: Abnormal    Collection Time    05/14/13 6:00 AM   Result  Value  Range    Sodium  141  135 - 145 mEq/L    Potassium  3.3 (*)  3.5 - 5.1 mEq/L    Chloride  108  96 - 112 mEq/L    CO2  24  19 - 32 mEq/L    Glucose, Bld  90  70 - 99 mg/dL    BUN  19  6 - 23 mg/dL    Creatinine, Ser  7.82  0.50 - 1.10 mg/dL    Calcium  8.2 (*)  8.4 - 10.5 mg/dL    GFR calc non Af Amer  80 (*)  >90 mL/min    GFR calc Af Amer  >90  >90 mL/min    Comment:  (NOTE)     The eGFR has been calculated using the CKD EPI equation.     This calculation has not been validated in all clinical situations.     eGFR's persistently <90 mL/min signify possible Chronic Kidney     Disease.   CBC Status: Abnormal    Collection Time    05/15/13 5:45 AM   Result  Value  Range    WBC  9.2  4.0 - 10.5 K/uL    RBC  3.50 (*)  3.87 - 5.11 MIL/uL     Hemoglobin  9.9 (*)  12.0 - 15.0 g/dL    HCT  95.6 (*)  21.3 - 46.0 %    MCV  89.7  78.0 - 100.0 fL    MCH  28.3  26.0 - 34.0 pg    MCHC  31.5  30.0 - 36.0 g/dL    RDW  08.6  57.8 - 46.9 %    Platelets  58 (*)  150 - 400 K/uL    Comment:  CONSISTENT WITH PREVIOUS RESULT   BASIC METABOLIC PANEL Status: Abnormal    Collection Time    05/15/13 5:45 AM   Result  Value  Range    Sodium  140  135 -  145 mEq/L    Potassium  3.8  3.5 - 5.1 mEq/L    Chloride  107  96 - 112 mEq/L    CO2  25  19 - 32 mEq/L    Glucose, Bld  102 (*)  70 - 99 mg/dL    BUN  18  6 - 23 mg/dL    Creatinine, Ser  4.09  0.50 - 1.10 mg/dL    Calcium  8.2 (*)  8.4 - 10.5 mg/dL    GFR calc non Af Amer  66 (*)  >90 mL/min    GFR calc Af Amer  77 (*)  >90 mL/min    Comment:  (NOTE)     The eGFR has been calculated using the CKD EPI equation.     This calculation has not been validated in all clinical situations.     eGFR's persistently <90 mL/min signify possible Chronic Kidney     Disease.    Dg Chest 2 View  05/14/2013 CLINICAL DATA: Possible apical pneumothorax on shoulder x-ray. EXAM: CHEST 2 VIEW COMPARISON: Right shoulder radiographs 05/14/2013 and chest radiographs 04/2013. FINDINGS: Right jugular Port-A-Cath remains in place with tip overlying the mid to lower SVC, unchanged. The right lung is diminished in volume. There is new opacity in the right lung base which may reflect a subpulmonic effusion. Increased density in the right hilum is stable to slightly increased from prior chest radiograph. No definite pneumothorax is identified. The left lung is well inflated and clear. Calcific supraspinatus tendinosis is again noted. IMPRESSION: 1. No definite pneumothorax identified. 2. Increased opacity in the right lung base, which may reflect a subpulmonic effusion. 3. Stable to mildly increased right hilar opacity in the setting of treated lung cancer. Electronically Signed By: Sebastian Ache On: 05/14/2013 17:45  Dg Shoulder  Right  05/14/2013 CLINICAL DATA: Pain. Metastatic cancer. EXAM: RIGHT SHOULDER - 2+ VIEW COMPARISON: Chest x-ray 05/10/2013. FINDINGS: Port-A-Cath noted. Right medial lung field changes suggesting prior radiation therapy. A subtle right apical pneumothorax cannot be excluded. Lucency is noted in the right scapula in the region of the upper bony glenoid. This is suspicious for lytic metastatic focus. Further evaluation with CT or MRI can be obtained. PET-CT can be obtained. No acute bony abnormality identified. There are degenerative changes present about the right shoulder. Evidence of calcific supraspinatus tendinitis noted. IMPRESSION: 1. Possible lytic lesion in the right scapula in the region of the bony glenoid. Further evaluation with CT or MRI can be obtained. PET-CT can be obtained . 2. Post radiation therapy changes projected over the right mid lung field. 3. Port-A-Cath noted in good anatomic position. A subtle right apical pneumothorax cannot be excluded. These results were called by telephone at the time of interpretation on 05/14/2013 at 3:55 PM to Dr. Cyndie Mull , who verbally acknowledged these results. Electronically Signed By: Maisie Fus Register On: 05/14/2013 15:59   Post Admission Physician Evaluation:   1. Functional deficits secondary to right homonymous hemianopsia right-handed sensory right sided weakness as well as receptive aphasia and apraxia, this may represent the watershed infarcts  Left frontoparietal rather than the cerebellar . 2. Patient is admitted to receive collaborative, interdisciplinary care between the physiatrist, rehab nursing staff, and therapy team. 3. Patient's level of medical complexity and substantial therapy needs in context of that medical necessity cannot be provided at a lesser intensity of care such as a SNF. 4. Patient has experienced substantial functional loss from his/her baseline which was documented above under the "Functional  History" and "Functional  Status" headings. Judging by the patient's diagnosis, physical exam, and functional history, the patient has potential for functional progress which will result in measurable gains while on inpatient rehab. These gains will be of substantial and practical use upon discharge in facilitating mobility and self-care at the household level. 5. Physiatrist will provide 24 hour management of medical needs as well as oversight of the therapy plan/treatment and provide guidance as appropriate regarding the interaction of the two. 6. 24 hour rehab nursing will assist with bladder management, bowel management, safety, skin/wound care, disease management, medication administration, pain management and patient education and help integrate therapy concepts, techniques,education, etc. 7. PT will assess and treat for/with: pre gait, gait training, endurance , safety, equipment, neuromuscular re education. Goals are: Min A mob. 8. OT will assess and treat for/with: ADLs, Cognitive perceptual skills, Neuromuscular re education, safety, endurance, equipment. Goals are: Min A. 9. SLP will assess and treat for/with: language, swallow,apraxia. Goals are: follow simple commands, express basic needs. 10. Case Management and Social Worker will assess and treat for psychological issues and discharge planning. 11. Team conference will be held weekly to assess progress toward goals and to determine barriers to discharge. 12. Patient will receive at least 3 hours of therapy per day at least 5 days per week. 13. ELOS: 2-2.5 wk  14. 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  1. DVT Prophylaxis/Anticoagulation: Mechanical: Sequential compression devices, below knee Bilateral lower extremities---recheck platelets as dropping.  2. Pain Management: Continue MS contin with prn oxycodone for now. Palliative XRT to Right scapula planned tommorrow  3. Mood: Will continue to provide  ego support--frustrated due to global aphasia. LCSW to follow for evaluation as aphasia improves.  4. Neuropsych: This patient is not capable of making decisions on her own behalf.  5. E coli UTI: continue cipro. Antibiotic D# 4/7  6. NSTEMI: Likely due demand ischemia from stroke and question of cardioembolic event. Continue ASA and lipitor.  7. Thrombocytopenia: likely due to chemo. Will continue to [facility] for signs of bleeding. H/H with steady drop. Check stool guaiacs.  8. Lung cancer with mets to liver? and right glenohumeral area: Continue Tarceva. Discontinue IVF to avoid overload.   Erick Colace M.D. [facility] FAAPM&R (Sports Med, Neuromuscular Med) Diplomate Am Board of Electrodiagnostic Med Diplomate Am Board of Pain Medicine Fellow Am Board of Interventional Pain Physicians  05/15/2013

## 2013-05-15 NOTE — Progress Notes (Signed)
Subjective: Patient was not discharged yesterday-[facility] and felt appropriate for inpatient rehabilitation. I discussed management of her bony metastases with Dr. Margaretmary Bayley who saw her and discussed with the family last evening. He is recommended a single radiation treatment to the right scapula which he will coordinate. Patient reports persistent right shoulder pain which has improved slightly since starting MS Contin but still requiring breakthrough medications. Breathing is stable. She is now wearing oxygen.  Objective: Vital signs in last 24 hours: Temp:  [97.3 F (36.3 C)-98.1 F (36.7 C)] 97.3 F (36.3 C) (11/25 0619) Pulse Rate:  [106-118] 110 (11/25 0619) Resp:  [18-22] 22 (11/25 0619) BP: (113-142)/(54-81) 134/73 mmHg (11/25 0619) SpO2:  [89 %-98 %] 98 % (11/25 0619) Weight change:  Last BM Date: 05/14/13  CBG (last 3)  No results found for this basename: GLUCAP,  in the last 72 hours  Intake/Output from previous day:   Intake/Output this shift:    General appearance: alert and no distress Eyes: no scleral icterus Throat: oropharynx moist without erythema Resp: clear to auscultation bilaterally Cardio: regular rate and rhythm and With frequent ectopy GI: soft, non-tender; bowel sounds normal; no masses,  no organomegaly Extremities: no clubbing, cyanosis or edema  Neuro: Moderate right facial droop; Right arm 0/5 strength with some neglect; some movement of right leg; intermittent expressive aphasia.   Lab Results:  Recent Labs  05/14/13 0600 05/15/13 0545  NA 141 140  K 3.3* 3.8  CL 108 107  CO2 24 25  GLUCOSE 90 102*  BUN 19 18  CREATININE 0.72 0.82  CALCIUM 8.2* 8.2*   No results found for this basename: AST, ALT, ALKPHOS, BILITOT, PROT, ALBUMIN,  in the last 72 hours  Recent Labs  05/14/13 0600 05/15/13 0545  WBC 8.6 9.2  HGB 10.5* 9.9*  HCT 32.1* 31.4*  MCV 88.7 89.7  PLT 73* 58*   Lab Results   Component Value Date   INR 1.22 05/10/2013   INR 0.99 01/12/2011   INR 0.95 12/30/2010    Studies/Results: Dg Chest 2 View  05/14/2013   CLINICAL DATA:  Possible apical pneumothorax on shoulder x-ray.  EXAM: CHEST  2 VIEW  COMPARISON:  Right shoulder radiographs 05/14/2013 and chest radiographs 04/2013.  FINDINGS: Right jugular Port-A-Cath remains in place with tip overlying the mid to lower SVC, unchanged. The right lung is diminished in volume. There is new opacity in the right lung base which may reflect a subpulmonic effusion. Increased density in the right hilum is stable to slightly increased from prior chest radiograph. No definite pneumothorax is identified. The left lung is well inflated and clear. Calcific supraspinatus tendinosis is again noted.  IMPRESSION: 1. No definite pneumothorax identified. 2. Increased opacity in the right lung base, which may reflect a subpulmonic effusion. 3. Stable to mildly increased right hilar opacity in the setting of treated lung cancer.   Electronically Signed   By: Sebastian Ache   On: 05/14/2013 17:45   Dg Shoulder Right  05/14/2013   CLINICAL DATA:  Pain.  Metastatic cancer.  EXAM: RIGHT SHOULDER - 2+ VIEW  COMPARISON:  Chest x-ray 05/10/2013.  FINDINGS: Port-A-Cath noted. Right medial lung field changes suggesting prior radiation therapy. A subtle right apical pneumothorax cannot be excluded.  Lucency is noted in the right scapula in the region of the upper bony glenoid. This is suspicious for lytic metastatic focus. Further evaluation with CT or MRI can be obtained. PET-CT can be  obtained. No acute bony abnormality identified. There are degenerative changes present about the right shoulder. Evidence of calcific supraspinatus tendinitis noted.  IMPRESSION: 1. Possible lytic lesion in the right scapula in the region of the bony glenoid. Further evaluation with CT or MRI can be obtained. PET-CT can be obtained . 2. Post radiation therapy changes projected  over the right mid lung field. 3. Port-A-Cath noted in good anatomic position. A subtle right apical pneumothorax cannot be excluded. These results were called by telephone at the time of interpretation on 05/14/2013 at 3:55 PM to Dr. Cyndie Mull , who verbally acknowledged these results.   Electronically Signed   By: Maisie Fus  Register   On: 05/14/2013 15:59     Medications: Scheduled: . aspirin  300 mg Rectal Daily   Or  . aspirin  325 mg Oral Daily  . atorvastatin  10 mg Oral q1800  . ciprofloxacin  500 mg Oral BID  . erlotinib  100 mg Oral QAC breakfast  . feeding supplement (ENSURE COMPLETE)  237 mL Oral BID BM  . morphine  15 mg Oral Q12H  . sodium chloride  10-40 mL Intracatheter Q12H   Continuous: . sodium chloride 75 mL/hr at 05/14/13 0454    Assessment/Plan: Principal Problem: 1.CVA (cerebral infarction), right hemiplegia and expressive aphasia- continue aspirin and statin.  Coumadin not recommneded per Stroke Service. Active Problems: 2. Lung cancer with Right scapular metastasis- Continue Tarceva.  Dr. Kathrynn Running plans to proceed with single  palliative radiation treatment for pain control. Continue MS Contin with Percocet as needed for breakthrough symptoms. Consider thoracentesis for pleural effusion if increasing effusion, worsening hypoxia or shortness of breath. 3. Non-ST Elevation MI- likely secondary to CVA/hypercoagulability. Given normal EF and no wall motion abnormalities, no further studies warranted. Consider low dose Bblocker if BP increased or HR remains elevated but need to avoid hypotension in setting of CVA. 4. COPD (chronic obstructive pulmonary disease)- stable without therapy.  5. Hypertension- consider BBlocker if increased. Avoid hypotension due to recent CVA.  6. Hypercholesterolemia- continue statin.  7.Thrombocytopenia- stable  8. UTI (lower urinary tract infection)-continue Cipro to complete 7 days of antibiotic therapy 9. Severe Protein Calorie  Malnutrition-continue ensure shakes  10. Disposition-discharge to Inpatient Rehab today.  Timing of Radiation treatment to be coordinated through the cancer center but should not delay discharge.  No changes to discharge medications or discharge documentation other than noted above.   11. Code Status- discussed with patient and daughters- she is DNR, has living will.     LOS: 5 days   Adonis Yim,W [name] 05/15/2013, 7:15 AM

## 2013-05-15 NOTE — Progress Notes (Signed)
Clinical Social Work Department CLINICAL SOCIAL WORK PLACEMENT NOTE 05/15/2013  Patient:  [name], [name]  Account Number:  [id number] Admit date:  05/14/2013  Clinical Social Worker:  [name], CLINICAL SOCIAL WORKER  Date/time:  05/15/2013 05:42 AM  Clinical Social Work is seeking post-discharge placement for this patient at the following level of care:   SKILLED NURSING   (*CSW will update this form in Epic as items are completed)   05/14/2013  Patient/family provided with [facility] Department of Clinical Social Work's list of facilities offering this level of care within the geographic area requested by the patient (or if unable, by the patient's family).  05/14/2013  Patient/family informed of their freedom to choose among providers that offer the needed level of care, that participate in Medicare, Medicaid or managed care program needed by the patient, have an available bed and are willing to accept the patient.  05/14/2013  Patient/family informed of [facility]' ownership interest in [facility], as well as of the fact that they are under no obligation to receive care at this facility.  PASARR submitted to EDS on  PASARR number received from EDS on   FL2 transmitted to all facilities in geographic area requested by pt/family on  05/14/2013 FL2 transmitted to all facilities within larger geographic area on 05/14/2013  Patient informed that his/her managed care company has contracts with or will negotiate with  certain facilities, including the following:     Patient/family informed of bed offers received:   Patient chooses bed at  Physician recommends and patient chooses bed at    Patient to be transferred to  on   Patient to be transferred to facility by   The following physician request were entered in Epic:   Additional Comments:   Lenord Carbo  [facility]  4N 1-16;  6N1-16 Phone: [phone]

## 2013-05-15 NOTE — Progress Notes (Signed)
Radiation Oncology         [phone] ________________________________  Initial inpatient established patient re-evaluation  Name: [name] MRN: [id number]  Date: 05/14/2013  DOB: [birth year]  NW:GNFA,O Riley Lam, MD  Si Gaul, MD   REFERRING PHYSICIAN: Si Gaul, MD  DIAGNOSIS: 79 yo woman with painful right scapular metastasis from metastatic lung cancer.  HISTORY OF PRESENT ILLNESS::[name] is a 79 y.o. female who received radiotherapy under my care for stage IIIA non-small cell lung cancer in 2012.  She has been receiving Tarceva most recently due to progressive disease.  She developed shoulder pain prompting MRI of the right shoulder at the office Dr. Malon Kindle.  This showed a destructive bone metastasis of the right scapula glenoid region.  She was referred for palliative radiation, but, became hospitlized in the interim with CVA.  I was asked to see her regarding her shoulder pain as she transitions into rehab.  PREVIOUS RADIATION THERAPY: Yes January 19, 2011 through March 08, 2011.-The patient's grossly involved mediastinal and right hilar adenopathy were treated along with the right upper lung primary tumor to a total prescription dose of 66 Gy in 33 fractions of 2 Gy using TomoTherapy.  PAST MEDICAL HISTORY:  has a past medical history of Lung mass; History of radiation therapy (7/31/212-03/08/2011); COPD (chronic obstructive pulmonary disease); Hypertension; Hypercholesterolemia; Osteoporosis; History of chemotherapy; lung ca (July 2012); and Liver metastases (dx'd 06/16/12).    PAST SURGICAL HISTORY: Past Surgical History  Procedure Laterality Date  . Abdominal hysterectomy      s/p for fibroid tumor  . Portacath placement      right ij   . Back surgery      FAMILY HISTORY: family history includes Breast cancer in her maternal grandmother, mother, and sister; Ovarian cancer in her maternal aunt.  SOCIAL HISTORY:  reports that she quit  smoking about 6 years ago. Her smoking use included Cigarettes. She has a 50 pack-year smoking history. She does not have any smokeless tobacco history on file. She reports that she drinks alcohol. She reports that she does not use illicit drugs.  ALLERGIES: Aleve and Zyban  MEDICATIONS:  No current facility-administered medications for this encounter.   Current Outpatient Prescriptions  Medication Sig Dispense Refill  . acetaminophen (TYLENOL) 325 MG tablet Take 2 tablets (650 mg total) by mouth every 4 (four) hours as needed for mild pain (or temp >/= 99.5 F).      [name] aspirin 325 MG tablet Take 1 tablet (325 mg total) by mouth daily.      [name] atorvastatin (LIPITOR) 10 MG tablet Take 1 tablet (10 mg total) by mouth daily at 6 PM.  30 tablet  5  . ciprofloxacin (CIPRO) 500 MG tablet Take 1 tablet (500 mg total) by mouth 2 (two) times daily.  8 tablet  0  . feeding supplement, ENSURE COMPLETE, (ENSURE COMPLETE) LIQD Take 237 mLs by mouth 2 (two) times daily between meals.  60 Bottle  6  . morphine (MS CONTIN) 15 MG 12 hr tablet Take 1 tablet (15 mg total) by mouth every 12 (twelve) hours.  60 tablet  0  . oxyCODONE-acetaminophen (PERCOCET/ROXICET) 5-325 MG per tablet Take 1 tablet by mouth every 4 (four) hours as needed for severe pain.  60 tablet  0   Facility-Administered Medications Ordered in Other Encounters  Medication Dose Route Frequency Provider Last Rate Last Dose  . 0.9 %  sodium chloride infusion   Intravenous Continuous W Buren Kos,  MD 75 mL/hr at 05/14/13 0454    . acetaminophen (TYLENOL) tablet 650 mg  650 mg Oral Q4H PRN Elease Etienne, MD   650 mg at 05/12/13 1012   Or  . acetaminophen (TYLENOL) suppository 650 mg  650 mg Rectal Q4H PRN Elease Etienne, MD      . aspirin suppository 300 mg  300 mg Rectal Daily Elease Etienne, MD   300 mg at 05/10/13 1740   Or  . aspirin tablet 325 mg  325 mg Oral Daily Elease Etienne, MD   325 mg at 05/15/13 0949  . atorvastatin  (LIPITOR) tablet 10 mg  10 mg Oral q1800 Maryann Mikhail, DO   10 mg at 05/14/13 1715  . ciprofloxacin (CIPRO) tablet 500 mg  500 mg Oral BID [name], MD   500 mg at 05/15/13 5784  . diphenoxylate-atropine (LOMOTIL) 2.5-0.025 MG per tablet 1 tablet  1 tablet Oral QID PRN Elease Etienne, MD      . erlotinib (TARCEVA) tablet 100 mg  100 mg Oral QAC breakfast Elease Etienne, MD   100 mg at 05/15/13 0827  . feeding supplement (ENSURE COMPLETE) (ENSURE COMPLETE) liquid 237 mL  237 mL Oral BID BM [name], MD   237 mL at 05/15/13 1439  . morphine (MS CONTIN) 12 hr tablet 15 mg  15 mg Oral Q12H W Buren Kos, MD   15 mg at 05/15/13 0950  . oxyCODONE-acetaminophen (PERCOCET/ROXICET) 5-325 MG per tablet 1 tablet  1 tablet Oral Q8H PRN Elease Etienne, MD   1 tablet at 05/15/13 1442  . sodium chloride 0.9 % injection 10-40 mL  10-40 mL Intracatheter Q12H W Buren Kos, MD      . sodium chloride 0.9 % injection 10-40 mL  10-40 mL Intracatheter PRN [name], MD   10 mL at 05/15/13 1626  . traMADol (ULTRAM) tablet 50 mg  50 mg Oral Q6H PRN Elease Etienne, MD   50 mg at 05/14/13 2029    REVIEW OF SYSTEMS:  A 15 point review of systems is documented in the electronic medical record. This was obtained by the nursing staff. However, I reviewed this with the patient to discuss relevant findings and make appropriate changes.  Pertinent items are noted in HPI.   PHYSICAL EXAM:  She appears frail.  Per Clelia Croft General appearance: alert and no distress  Eyes: no scleral icterus  Throat: oropharynx moist without erythema  Resp: clear to auscultation bilaterally  Cardio: regular rate and rhythm and With frequent ectopy  GI: soft, non-tender; bowel sounds normal; no masses, no organomegaly  Extremities: no clubbing, cyanosis or edema  Neuro: Moderate right facial droop; Right arm 0/5 strength with some neglect; some movement of right leg; intermittent expressive aphasia.  KPS = 30  100 -  Normal; no complaints; no evidence of disease. 90   - Able to carry on normal activity; minor signs or symptoms of disease. 80   - Normal activity with effort; some signs or symptoms of disease. 74   - Cares for self; unable to carry on normal activity or to do active work. 60   - Requires occasional assistance, but is able to care for most of his personal needs. 50   - Requires considerable assistance and frequent medical care. 40   - Disabled; requires special care and assistance. 30   - Severely disabled; hospital admission is indicated although death not imminent. 20   -  Very sick; hospital admission necessary; active supportive treatment necessary. 10   - Moribund; fatal processes progressing rapidly. 0     - Dead  Karnofsky DA, Abelmann WH, Craver LS and Burchenal [facility] [phone]) The use of the nitrogen mustards in the palliative treatment of carcinoma: with particular reference to bronchogenic carcinoma Cancer 1 634-56  LABORATORY DATA:  Lab Results  Component Value Date   WBC 9.2 05/15/2013   HGB 9.9* 05/15/2013   HCT 31.4* 05/15/2013   MCV 89.7 05/15/2013   PLT 58* 05/15/2013   Lab Results  Component Value Date   NA 140 05/15/2013   K 3.8 05/15/2013   CL 107 05/15/2013   CO2 25 05/15/2013   Lab Results  Component Value Date   ALT 51* 05/11/2013   AST 57* 05/11/2013   ALKPHOS 169* 05/11/2013   BILITOT 1.5* 05/11/2013     RADIOGRAPHY: Dg Chest 2 View  05/14/2013   CLINICAL DATA:  Possible apical pneumothorax on shoulder x-ray.  EXAM: CHEST  2 VIEW  COMPARISON:  Right shoulder radiographs 05/14/2013 and chest radiographs 04/2013.  FINDINGS: Right jugular Port-A-Cath remains in place with tip overlying the mid to lower SVC, unchanged. The right lung is diminished in volume. There is new opacity in the right lung base which may reflect a subpulmonic effusion. Increased density in the right hilum is stable to slightly increased from prior chest radiograph. No definite  pneumothorax is identified. The left lung is well inflated and clear. Calcific supraspinatus tendinosis is again noted.  IMPRESSION: 1. No definite pneumothorax identified. 2. Increased opacity in the right lung base, which may reflect a subpulmonic effusion. 3. Stable to mildly increased right hilar opacity in the setting of treated lung cancer.   Electronically Signed   By: Sebastian Ache   On: 05/14/2013 17:45   Dg Chest 2 View  05/11/2013   CLINICAL DATA:  Stroke and right-sided weakness.  EXAM: CHEST  2 VIEW  COMPARISON:  Chest CT 02/15/2013  FINDINGS: Right IJ porta catheter in stable position. Right perihilar mass with surrounding interstitial changes in this patient with treated lung cancer. No recent radiography for direct comparison. Small right pleural effusion which is chronic. Left lung is well-aerated.  IMPRESSION: 1. No active cardiopulmonary disease. 2. Chronic, small right pleural effusion. 3. Treated right lung cancer which is followed by CT.   Electronically Signed   By: Tiburcio Pea M.D.   On: 05/11/2013 05:31   Dg Shoulder Right  05/14/2013   CLINICAL DATA:  Pain.  Metastatic cancer.  EXAM: RIGHT SHOULDER - 2+ VIEW  COMPARISON:  Chest x-ray 05/10/2013.  FINDINGS: Port-A-Cath noted. Right medial lung field changes suggesting prior radiation therapy. A subtle right apical pneumothorax cannot be excluded.  Lucency is noted in the right scapula in the region of the upper bony glenoid. This is suspicious for lytic metastatic focus. Further evaluation with CT or MRI can be obtained. PET-CT can be obtained. No acute bony abnormality identified. There are degenerative changes present about the right shoulder. Evidence of calcific supraspinatus tendinitis noted.  IMPRESSION: 1. Possible lytic lesion in the right scapula in the region of the bony glenoid. Further evaluation with CT or MRI can be obtained. PET-CT can be obtained . 2. Post radiation therapy changes projected over the right mid lung  field. 3. Port-A-Cath noted in good anatomic position. A subtle right apical pneumothorax cannot be excluded. These results were called by telephone at the time of interpretation on 05/14/2013 at 3:55  PM to Dr. Cyndie Mull , who verbally acknowledged these results.   Electronically Signed   By: Maisie Fus  Register   On: 05/14/2013 15:59   Ct Head Wo Contrast  05/10/2013   CLINICAL DATA:  Weakness  EXAM: CT HEAD WITHOUT CONTRAST  CT CERVICAL SPINE WITHOUT CONTRAST  TECHNIQUE: Multidetector CT imaging of the head and cervical spine was performed following the standard protocol without intravenous contrast. Multiplanar CT image reconstructions of the cervical spine were also generated.  COMPARISON:  MRI 09/30/2011  FINDINGS: CT HEAD FINDINGS  Generalized atrophy. Chronic microvascular ischemic change in the white matter. Chronic infarct left cerebellum not present previously.  Negative for acute infarct, hemorrhage, or mass lesion. No edema or midline shift. Negative for skull lesion.  CT CERVICAL SPINE FINDINGS  Normal cervical alignment. Negative for fracture or mass lesion. Mild disc and mild facet degeneration.  IMPRESSION: Atrophy and chronic ischemia.  No acute abnormality.  Mild cervical degenerative change.  No acute bony abnormality.   Electronically Signed   By: Marlan Palau M.D.   On: 05/10/2013 09:04   Ct Cervical Spine Wo Contrast  05/10/2013   CLINICAL DATA:  Weakness  EXAM: CT HEAD WITHOUT CONTRAST  CT CERVICAL SPINE WITHOUT CONTRAST  TECHNIQUE: Multidetector CT imaging of the head and cervical spine was performed following the standard protocol without intravenous contrast. Multiplanar CT image reconstructions of the cervical spine were also generated.  COMPARISON:  MRI 09/30/2011  FINDINGS: CT HEAD FINDINGS  Generalized atrophy. Chronic microvascular ischemic change in the white matter. Chronic infarct left cerebellum not present previously.  Negative for acute infarct, hemorrhage, or mass lesion. No  edema or midline shift. Negative for skull lesion.  CT CERVICAL SPINE FINDINGS  Normal cervical alignment. Negative for fracture or mass lesion. Mild disc and mild facet degeneration.  IMPRESSION: Atrophy and chronic ischemia.  No acute abnormality.  Mild cervical degenerative change.  No acute bony abnormality.   Electronically Signed   By: Marlan Palau M.D.   On: 05/10/2013 09:04   Mr Maxine Glenn Head Wo Contrast  05/10/2013   CLINICAL DATA:  Weakness.  History of lung cancer.  EXAM: MRI HEAD WITHOUT CONTRAST  MRA HEAD WITHOUT CONTRAST  TECHNIQUE: Multiplanar, multiecho pulse sequences of the brain and surrounding structures were obtained without intravenous contrast. Angiographic images of the head were obtained using MRA technique without contrast.  COMPARISON:  05/10/2013 CT.  09/30/2011 brain MR.  FINDINGS: MRI HEAD FINDINGS  Multiple acute/subacute infarcts throughout the cerebellum bilaterally and slightly larger on the left. Supratentorial bilateral infarcts greater on the left with largest area of acute infarction involving the posterior left operculum region. There is a parasagittal distribution of some of the infarcts involving the frontal-parietal lobe which may indicate a component of watershed infarction. However, with involvement of multiple vascular territories, embolus is of concern.  No intracranial hemorrhage.  Mild small vessel disease type changes.  Global atrophy without hydrocephalus.  No intracranial mass lesion noted on this unenhanced exam.  MRA HEAD FINDINGS  Ectatic distal vertical cervical segment of the left internal carotid artery. No significant stenosis of either internal carotid artery or carotid terminus.  Aplastic A1 segment right anterior cerebral artery.  No significant stenosis of the M1 segment of the middle cerebral artery on either side.  Decrease number of visualized middle cerebral artery branch vessels. The middle cerebral artery branches which are visualized are narrowed  and irregular with narrowing more notable on the left.  Moderate stenosis portions of  the A2 segment of the right anterior cerebral artery.  Fetal type contribution to the posterior circulation.  Left vertebral artery is small and ends in a posterior inferior cerebellar artery distribution. Mild to moderate narrowing of portions of the distal left vertebral artery and the left posterior inferior cerebellar artery.  Mild to moderate narrowing of the distal right vertebral artery and proximal basilar artery. Mild narrowing of portions of the right posterior inferior cerebellar artery.  Non visualization of the anterior inferior cerebellar arteries.  Moderate narrowing superior cerebral artery bilaterally.  There is a bulge along the superior margin of the the basilar tip. This may reflect result of fetal type origin of the posterior cerebral arteries. Small aneurysm at this level is not excluded although felt less likely consideration.  Mild to moderate narrowing distal branches of the posterior cerebral artery bilaterally.  IMPRESSION: Multiple acute/subacute infarcts throughout the cerebellum bilaterally and slightly larger on the left. Supratentorial bilateral infarcts greater on the left with largest area of acute infarction involving the posterior left operculum region. There is a parasagittal distribution of some of the infarcts involving the frontal-parietal lobe which may indicate a component of watershed infarction. However, with involvement of multiple vascular territories, embolus is of concern.  Intracranial atherosclerotic type changes as noted above most notable involving branch vessels and posterior circulation.  These results were called by telephone at the time of interpretation on 05/10/2013 at 2:27 PM to Dr. Linwood Dibbles , who verbally acknowledged these results.   Electronically Signed   By: Bridgett Larsson M.D.   On: 05/10/2013 14:37   Mr Laqueta Jean FA Contrast  05/10/2013   CLINICAL DATA:  Weakness.   History of lung cancer.  EXAM: MRI HEAD WITHOUT CONTRAST  MRA HEAD WITHOUT CONTRAST  TECHNIQUE: Multiplanar, multiecho pulse sequences of the brain and surrounding structures were obtained without intravenous contrast. Angiographic images of the head were obtained using MRA technique without contrast.  COMPARISON:  05/10/2013 CT.  09/30/2011 brain MR.  FINDINGS: MRI HEAD FINDINGS  Multiple acute/subacute infarcts throughout the cerebellum bilaterally and slightly larger on the left. Supratentorial bilateral infarcts greater on the left with largest area of acute infarction involving the posterior left operculum region. There is a parasagittal distribution of some of the infarcts involving the frontal-parietal lobe which may indicate a component of watershed infarction. However, with involvement of multiple vascular territories, embolus is of concern.  No intracranial hemorrhage.  Mild small vessel disease type changes.  Global atrophy without hydrocephalus.  No intracranial mass lesion noted on this unenhanced exam.  MRA HEAD FINDINGS  Ectatic distal vertical cervical segment of the left internal carotid artery. No significant stenosis of either internal carotid artery or carotid terminus.  Aplastic A1 segment right anterior cerebral artery.  No significant stenosis of the M1 segment of the middle cerebral artery on either side.  Decrease number of visualized middle cerebral artery branch vessels. The middle cerebral artery branches which are visualized are narrowed and irregular with narrowing more notable on the left.  Moderate stenosis portions of the A2 segment of the right anterior cerebral artery.  Fetal type contribution to the posterior circulation.  Left vertebral artery is small and ends in a posterior inferior cerebellar artery distribution. Mild to moderate narrowing of portions of the distal left vertebral artery and the left posterior inferior cerebellar artery.  Mild to moderate narrowing of the  distal right vertebral artery and proximal basilar artery. Mild narrowing of portions of the right posterior inferior  cerebellar artery.  Non visualization of the anterior inferior cerebellar arteries.  Moderate narrowing superior cerebral artery bilaterally.  There is a bulge along the superior margin of the the basilar tip. This may reflect result of fetal type origin of the posterior cerebral arteries. Small aneurysm at this level is not excluded although felt less likely consideration.  Mild to moderate narrowing distal branches of the posterior cerebral artery bilaterally.  IMPRESSION: Multiple acute/subacute infarcts throughout the cerebellum bilaterally and slightly larger on the left. Supratentorial bilateral infarcts greater on the left with largest area of acute infarction involving the posterior left operculum region. There is a parasagittal distribution of some of the infarcts involving the frontal-parietal lobe which may indicate a component of watershed infarction. However, with involvement of multiple vascular territories, embolus is of concern.  Intracranial atherosclerotic type changes as noted above most notable involving branch vessels and posterior circulation.  These results were called by telephone at the time of interpretation on 05/10/2013 at 2:27 PM to Dr. Linwood Dibbles , who verbally acknowledged these results.   Electronically Signed   By: Bridgett Larsson M.D.   On: 05/10/2013 14:37      IMPRESSION: She is a very nice 79 yo woman with painful right scapular metastasis from metastatic lung cancer.  This is her only symptomatic disease site, now requiring MS Contin.  She may benefit from palliative radiotherapy to reduce narcotic need and improve pain.  PLAN:Today, I talked to the patient and family about the findings and work-up thus far.  We discussed the natural history of disease and general treatment, highlighting the role or radiotherapy in the management.  We discussed the available  radiation techniques, and focused on the details of logistics and delivery.  We reviewed the anticipated acute and late sequelae associated with radiation in this setting.  The patient was encouraged to ask questions that I answered to the best of my ability.  I filled out a patient counseling form during our discussion including treatment diagrams.  We retained a copy for our records.  The patient would like to proceed with radiation and will be scheduled for CT simulation.  I spent 60 minutes minutes face to face with the patient and more than 50% of that time was spent in counseling and/or coordination of care.   I will coordinate CT simulation at cancer center tomorrow via carelink.  She will be able to receive treatment in a single dose in the next few days.  During CT simulation, I will assess current volume of pleural effusion for consideration of throracentesis in light of increasing dyspnea.   ------------------------------------------------  Artist Pais Kathrynn Running, M.D.

## 2013-05-15 NOTE — Progress Notes (Signed)
UR complete.  Vayda Dungee RN, MSN 

## 2013-05-15 NOTE — Progress Notes (Signed)
Physical Therapy Treatment Patient Details Name: [name] MRN: [id number] DOB: Oct 25, 1931 Today's Date: 05/15/2013 Time: 4098-1191 PT Time Calculation (min): 23 min  PT Assessment / Plan / Recommendation  History of Present Illness [name] is a 79 y.o. female who has known lung and liver cancer. 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 Patient is followed by Dr. Arbutus Ped as out patient . Family noted patient to be normal last night but this am when family went to check on her they were unable to open the bedroom due to patient behind door and not able to understand them. On consultation patient is globally aphasic, alert and shows a right facial droop and arm>leg weakness. Patient was on no ASA prior to hospitalization.    PT Comments   Pt presented with increased fatigue today compared to yesterday.  Pt required extended rest breaks between all mobility exercises today and expressed that she did not want to participate in further PT this session due to fatigue.  Pt will benefit from CIR to increase activity tolerance and increase mobility and functional independence.   Follow Up Recommendations  CIR     Does the patient have the potential to tolerate intense rehabilitation     Barriers to Discharge        Equipment Recommendations       Recommendations for Other Services Rehab consult  Frequency Min 4X/week   Progress towards PT Goals Progress towards PT goals: Progressing toward goals  Plan Current plan remains appropriate    Precautions / Restrictions Precautions Precautions: Fall Restrictions Weight Bearing Restrictions: No   Pertinent Vitals/Pain Said "yes" that she had pain prior to tx, pt did not rate or state where. Pt agreed to tx and positioned for comfort post tx    Mobility  Bed Mobility Bed Mobility: Supine to Sit;Sitting - Scoot to Edge of Bed Supine to Sit: 1: +2 Total assist Supine  to Sit: Patient Percentage: 40% Sitting - Scoot to Edge of Bed: 2: Max assist Details for Bed Mobility Assistance: Poor trunk control, requiring assistance and cues to maintain posture.  Assistance to come to sitting and to scoot to EOB. Transfers Transfers: Sit to Stand;Stand to [employer] Transfers Sit to Stand: 2: Max assist Stand to Sit: 2: Max assist Stand Pivot Transfers: 2: Max assist Details for Transfer Assistance: Cues for technique.  Assistance to come to full standing posture and cues to take steps towards chair; no buckling noted Ambulation/Gait Ambulation/Gait Assistance: Not tested (comment)    Exercises     PT Diagnosis:    PT Problem List:   PT Treatment Interventions:     PT Goals (current goals can now be found in the care plan section)    Visit Information  Last PT Received On: 05/15/13 Assistance Needed: +2 History of Present Illness: [name] is a 79 y.o. female who has known lung and liver cancer. 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 Patient is followed by Dr. Arbutus Ped as out patient . Family noted patient to be normal last night but this am when family went to check on her they were unable to open the bedroom due to patient behind door and not able to understand them. On consultation patient is globally aphasic, alert and shows a right facial droop and arm>leg weakness. Patient was  on no ASA prior to hospitalization.     Subjective Data      Cognition  Cognition Arousal/Alertness: Awake/alert Behavior During Therapy: WFL for tasks assessed/performed Overall Cognitive Status: Impaired/Different from baseline Area of Impairment: Following commands;Attention Current Attention Level: Focused Following Commands: Follows one step commands inconsistently;Follows multi-step commands inconsistently    Balance     End of Session PT - End of Session Equipment Utilized During  Treatment: Gait belt Activity Tolerance: Patient limited by fatigue Patient left: in chair;with call bell/phone within reach Nurse Communication: Mobility status   GP     Ernestina Columbia, SPTA 05/15/2013, 12:21 PM

## 2013-05-15 NOTE — Progress Notes (Addendum)
Occupational Therapy Treatment Patient Details Name: [name] MRN: [id number] DOB: January 27, 1932 Today's Date: 05/15/2013 Time: 1133-1200 OT Time Calculation (min): 27 min  OT Assessment / Plan / Recommendation  History of present illness [name] is a 79 y.o. female who has known lung and liver cancer. 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 Patient is followed by Dr. Arbutus Ped as out patient . Family noted patient to be normal last night but this am when family went to check on her they were unable to open the bedroom due to patient behind door and not able to understand them. On consultation patient is globally aphasic, alert and shows a right facial droop and arm>leg weakness. Patient was on no ASA prior to hospitalization.    OT comments  Pt progressing towards goals. Performed hand over hand with right arm for grooming tasks. Pt able to perform hygiene sitting on BSC.   Follow Up Recommendations  CIR;Supervision/Assistance - 24 hour    Barriers to Discharge       Equipment Recommendations  Other (comment) (defer to next venue)    Recommendations for Other Services Rehab consult  Frequency Min 2X/week   Progress towards OT Goals Progress towards OT goals: Progressing toward goals  Plan Discharge plan remains appropriate    Precautions / Restrictions Precautions Precautions: Fall Restrictions Weight Bearing Restrictions: No   Pertinent Vitals/Pain Pain in right arm. Positioned on pillow.     ADL  Grooming: Performed;Wash/dry face;Brushing hair (hand over hand with left hand) Where Assessed - Grooming: Supported sitting Toilet Transfer: +2 Total assistance Toilet Transfer: Patient Percentage: 30% ((30% for squat pivot and sit to stand 50% and stand to sit 3) Toilet Transfer Method: Sit to stand;Stand pivot Acupuncturist: Bedside commode Toileting - Clothing Manipulation and Hygiene:  Moderate assistance Where Assessed - Toileting Clothing Manipulation and Hygiene: Sit on 3-in-1 or toilet;Standing;Other (comment) (hygiene-sitting and clothing-standing) Equipment Used: Gait belt;Other (comment) (stedy) Transfers/Ambulation Related to ADLs: +2 Total A (30%) for stand pivot and stand to sit transfer. +2 Total A 50% for sit to stand transfer. Attempted to use stedy, but stand pivot technique works much better. ADL Comments: Attempted to use stedy for transfer, but pivot is better technique. OT had pt perform hygiene sitting on BSC and pt able to do so with left hand. OT performed hand over hand with pt's right UE to perform grooming tasks. Positioned pt's arm on BSC to weightbear during hygiene. Educated family to position RUE on pillow when in bed or chair.  Educated family that OT had pt weightbear on RUE and that it is beneficial and how we did hand over hand for grooming tasks. When asked to raise Rt arm, pt trying to initiate.    OT Diagnosis:    OT Problem List:   OT Treatment Interventions:     OT Goals(current goals can now be found in the care plan section) Acute Rehab OT Goals Patient Stated Goal: none stated OT Goal Formulation: With patient/family Time For Goal Achievement: 05/27/13 Potential to Achieve Goals: Good ADL Goals Pt Will Perform Grooming: with supervision;sitting;with set-up Pt Will Perform Upper Body Dressing: sitting;with min assist Pt Will Transfer to Toilet: with min assist;stand pivot transfer;bedside commode Pt Will Perform Toileting - Clothing Manipulation and hygiene: with min assist;sitting/lateral leans;sit to/from stand Additional ADL Goal #1: Pt will perform bed mobility at Min A level as precursors for ADLs.  Additional ADL Goal #2: Pt will sit EOB and maintain sitting balance for 5 minutes during functional task with Min guard assist  Visit Information  Last OT Received On: 05/15/13 Assistance Needed: +2 History of Present Illness:  [name] is a 79 y.o. female who has known lung and liver cancer. 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 Patient is followed by Dr. Arbutus Ped as out patient . Family noted patient to be normal last night but this am when family went to check on her they were unable to open the bedroom due to patient behind door and not able to understand them. On consultation patient is globally aphasic, alert and shows a right facial droop and arm>leg weakness. Patient was on no ASA prior to hospitalization.     Subjective Data      Prior Functioning       Cognition  Cognition Arousal/Alertness: Awake/alert Behavior During Therapy: WFL for tasks assessed/performed Overall Cognitive Status: Impaired/Different from baseline Area of Impairment: Attention Current Attention Level: Sustained Following Commands: Follows one step commands inconsistently;Follows multi-step commands inconsistently General Comments: pt able to answer yes that today is her birthday. Pt demonstrates basic needs 100% correct using yes/no response. Pt following all commands with delayed response due to apraxic movement.     Mobility  Bed Mobility Bed Mobility: Sit to Supine;Scooting to HOB Sit to Supine: 1: +2 Total assist Sit to Supine: Patient Percentage:  (75%) Scooting to HOB: 1: +2 Total assist Scooting to [facility]: Patient Percentage: 50% Details for Bed Mobility Assistance: Assistance with legs to return to supine position as well as with trunk.  Transfers Transfers: Sit to Stand;Stand to Sit Sit to Stand: 1: +2 Total assist;From chair/3-in-1 Sit to Stand: Patient Percentage: 50% Stand to Sit: 1: +2 Total assist;To chair/3-in-1;To bed Stand to Sit: Patient Percentage: 30% Details for Transfer Assistance: Tried transfer with stedy during session and pt having difficulty coming into full standing posture-used stedy to transfer to [facility], but pivot is better  techique. Pt not putting very minimal, if any, weight through RLE.  +2 total A (30%) for stand pivot transfer.     Exercises      Balance     End of Session OT - End of Session Equipment Utilized During Treatment: Gait belt;Other (comment);Oxygen (stedy) Patient left: in bed;with call bell/phone within reach  GO     Earlie Raveling OTR/L [phone] 05/15/2013, 2:39 PM

## 2013-05-15 NOTE — Progress Notes (Signed)
Pt d/c to CIR with all belongings and accompanied by family members . condition stable, report given to receiving RN

## 2013-05-16 ENCOUNTER — Encounter: Payer: Self-pay | Admitting: Radiation Oncology

## 2013-05-16 ENCOUNTER — Inpatient Hospital Stay (HOSPITAL_COMMUNITY): Payer: Medicare Other

## 2013-05-16 ENCOUNTER — Inpatient Hospital Stay (HOSPITAL_COMMUNITY): Payer: Medicare Other | Admitting: Physical Therapy

## 2013-05-16 ENCOUNTER — Other Ambulatory Visit: Payer: Self-pay

## 2013-05-16 ENCOUNTER — Ambulatory Visit: Admission: RE | Admit: 2013-05-16 | Payer: Medicare Other | Source: Ambulatory Visit | Admitting: Radiation Oncology

## 2013-05-16 ENCOUNTER — Inpatient Hospital Stay (HOSPITAL_COMMUNITY): Payer: Medicare Other | Admitting: Speech Pathology

## 2013-05-16 ENCOUNTER — Inpatient Hospital Stay (HOSPITAL_COMMUNITY)
Admission: AD | Admit: 2013-05-16 | Discharge: 2013-05-21 | DRG: 064 | Disposition: E | Payer: Medicare Other | Source: Ambulatory Visit | Attending: Internal Medicine | Admitting: Internal Medicine

## 2013-05-16 ENCOUNTER — Inpatient Hospital Stay (HOSPITAL_COMMUNITY): Payer: Medicare Other | Admitting: Occupational Therapy

## 2013-05-16 ENCOUNTER — Encounter (HOSPITAL_COMMUNITY): Payer: Self-pay | Admitting: Neurology

## 2013-05-16 ENCOUNTER — Ambulatory Visit: Payer: Medicare Other

## 2013-05-16 ENCOUNTER — Ambulatory Visit (HOSPITAL_COMMUNITY): Admission: RE | Admit: 2013-05-16 | Payer: Medicare Other | Source: Ambulatory Visit

## 2013-05-16 ENCOUNTER — Ambulatory Visit
Admit: 2013-05-16 | Discharge: 2013-05-16 | Disposition: A | Discharge status: Home / Self Care | Payer: Medicare Other | Attending: Radiation Oncology | Admitting: Radiation Oncology

## 2013-05-16 DIAGNOSIS — C7951 Secondary malignant neoplasm of bone: Secondary | ICD-10-CM | POA: Diagnosis present

## 2013-05-16 DIAGNOSIS — C3490 Malignant neoplasm of unspecified part of unspecified bronchus or lung: Secondary | ICD-10-CM

## 2013-05-16 DIAGNOSIS — Z803 Family history of malignant neoplasm of breast: Secondary | ICD-10-CM

## 2013-05-16 DIAGNOSIS — I6330 Cerebral infarction due to thrombosis of unspecified cerebral artery: Secondary | ICD-10-CM

## 2013-05-16 DIAGNOSIS — Z66 Do not resuscitate: Secondary | ICD-10-CM | POA: Diagnosis present

## 2013-05-16 DIAGNOSIS — M810 Age-related osteoporosis without current pathological fracture: Secondary | ICD-10-CM | POA: Diagnosis present

## 2013-05-16 DIAGNOSIS — I639 Cerebral infarction, unspecified: Secondary | ICD-10-CM | POA: Diagnosis present

## 2013-05-16 DIAGNOSIS — I4891 Unspecified atrial fibrillation: Secondary | ICD-10-CM | POA: Diagnosis present

## 2013-05-16 DIAGNOSIS — Z923 Personal history of irradiation: Secondary | ICD-10-CM

## 2013-05-16 DIAGNOSIS — IMO0002 Reserved for concepts with insufficient information to code with codable children: Secondary | ICD-10-CM | POA: Diagnosis present

## 2013-05-16 DIAGNOSIS — G9340 Encephalopathy, unspecified: Secondary | ICD-10-CM

## 2013-05-16 DIAGNOSIS — J4489 Other specified chronic obstructive pulmonary disease: Secondary | ICD-10-CM | POA: Diagnosis present

## 2013-05-16 DIAGNOSIS — R0600 Dyspnea, unspecified: Secondary | ICD-10-CM

## 2013-05-16 DIAGNOSIS — E7800 Pure hypercholesterolemia, unspecified: Secondary | ICD-10-CM

## 2013-05-16 DIAGNOSIS — Z8041 Family history of malignant neoplasm of ovary: Secondary | ICD-10-CM

## 2013-05-16 DIAGNOSIS — Z515 Encounter for palliative care: Secondary | ICD-10-CM

## 2013-05-16 DIAGNOSIS — I6340 Cerebral infarction due to embolism of unspecified cerebral artery: Principal | ICD-10-CM | POA: Diagnosis present

## 2013-05-16 DIAGNOSIS — I10 Essential (primary) hypertension: Secondary | ICD-10-CM

## 2013-05-16 DIAGNOSIS — C787 Secondary malignant neoplasm of liver and intrahepatic bile duct: Secondary | ICD-10-CM | POA: Diagnosis present

## 2013-05-16 DIAGNOSIS — Z87891 Personal history of nicotine dependence: Secondary | ICD-10-CM

## 2013-05-16 DIAGNOSIS — R569 Unspecified convulsions: Secondary | ICD-10-CM | POA: Diagnosis present

## 2013-05-16 DIAGNOSIS — G8929 Other chronic pain: Secondary | ICD-10-CM | POA: Diagnosis present

## 2013-05-16 DIAGNOSIS — J449 Chronic obstructive pulmonary disease, unspecified: Secondary | ICD-10-CM | POA: Diagnosis present

## 2013-05-16 DIAGNOSIS — I252 Old myocardial infarction: Secondary | ICD-10-CM

## 2013-05-16 DIAGNOSIS — R52 Pain, unspecified: Secondary | ICD-10-CM

## 2013-05-16 DIAGNOSIS — Z9221 Personal history of antineoplastic chemotherapy: Secondary | ICD-10-CM

## 2013-05-16 DIAGNOSIS — E785 Hyperlipidemia, unspecified: Secondary | ICD-10-CM | POA: Diagnosis present

## 2013-05-16 DIAGNOSIS — E43 Unspecified severe protein-calorie malnutrition: Secondary | ICD-10-CM | POA: Diagnosis present

## 2013-05-16 DIAGNOSIS — G8110 Spastic hemiplegia affecting unspecified side: Secondary | ICD-10-CM

## 2013-05-16 DIAGNOSIS — F411 Generalized anxiety disorder: Secondary | ICD-10-CM | POA: Diagnosis present

## 2013-05-16 DIAGNOSIS — C801 Malignant (primary) neoplasm, unspecified: Secondary | ICD-10-CM

## 2013-05-16 DIAGNOSIS — Z8673 Personal history of transient ischemic attack (TIA), and cerebral infarction without residual deficits: Secondary | ICD-10-CM

## 2013-05-16 LAB — CBC
HCT: 31.1 % — ABNORMAL LOW (ref 36.0–46.0)
Hemoglobin: 10.2 g/dL — ABNORMAL LOW (ref 12.0–15.0)
MCV: 87.6 fL (ref 78.0–100.0)
RBC: 3.55 MIL/uL — ABNORMAL LOW (ref 3.87–5.11)
RDW: 14.9 % (ref 11.5–15.5)
WBC: 8 10*3/uL (ref 4.0–10.5)

## 2013-05-16 LAB — BASIC METABOLIC PANEL
CO2: 25 mEq/L (ref 19–32)
Chloride: 106 mEq/L (ref 96–112)
Creatinine, Ser: 0.88 mg/dL (ref 0.50–1.10)
Potassium: 3.5 mEq/L (ref 3.5–5.1)

## 2013-05-16 LAB — GLUCOSE, CAPILLARY: Glucose-Capillary: 165 mg/dL — ABNORMAL HIGH (ref 70–99)

## 2013-05-16 MED ORDER — OXYCODONE HCL 5 MG PO TABS
5.0000 mg | ORAL_TABLET | Freq: Two times a day (BID) | ORAL | Status: DC
  Administered 2013-05-16: 5 mg via ORAL
  Filled 2013-05-16: qty 1

## 2013-05-16 MED ORDER — CIPROFLOXACIN HCL 250 MG PO TABS
250.0000 mg | ORAL_TABLET | Freq: Two times a day (BID) | ORAL | Status: DC
  Filled 2013-05-16 (×2): qty 1

## 2013-05-16 MED ORDER — IPRATROPIUM BROMIDE 0.02 % IN SOLN
RESPIRATORY_TRACT | Status: AC
  Filled 2013-05-16: qty 2.5

## 2013-05-16 MED ORDER — OXYCODONE-ACETAMINOPHEN 5-325 MG PO TABS: 1.0000 | ORAL_TABLET | ORAL | Status: DC | PRN

## 2013-05-16 MED ORDER — IPRATROPIUM BROMIDE 0.02 % IN SOLN: 0.5000 mg | Freq: Four times a day (QID) | RESPIRATORY_TRACT | Status: DC | PRN

## 2013-05-16 MED ORDER — LEVALBUTEROL HCL 0.63 MG/3ML IN NEBU
INHALATION_SOLUTION | RESPIRATORY_TRACT | Status: AC
  Filled 2013-05-16: qty 3

## 2013-05-16 MED ORDER — IPRATROPIUM BROMIDE 0.02 % IN SOLN
0.5000 mg | Freq: Four times a day (QID) | RESPIRATORY_TRACT | Status: DC
  Administered 2013-05-16: 0.5 mg via RESPIRATORY_TRACT
  Filled 2013-05-16: qty 2.5

## 2013-05-16 MED ORDER — LEVALBUTEROL HCL 0.63 MG/3ML IN NEBU
0.6300 mg | INHALATION_SOLUTION | Freq: Four times a day (QID) | RESPIRATORY_TRACT | Status: DC
  Administered 2013-05-16: 0.63 mg via RESPIRATORY_TRACT
  Filled 2013-05-16: qty 3

## 2013-05-16 MED ORDER — LEVALBUTEROL HCL 0.63 MG/3ML IN NEBU: 0.6300 mg | INHALATION_SOLUTION | Freq: Four times a day (QID) | RESPIRATORY_TRACT | Status: DC | PRN

## 2013-05-16 NOTE — Progress Notes (Signed)
Per RT protocol assessment, pt severity score was 6. Pt has no respiratory meds listed for home regimen. Pt is in no obvious respiratory distress, has clear bbs, and O2 sats are 97% on 3lpm [place]. O2 weaned to 2lpm. Pt tolerating well at this time. RT will continue to monitor.

## 2013-05-16 NOTE — Progress Notes (Addendum)
79 y.o. right-handed female with history of hypertension, COPD, metastatic non-small cell lung cancer with ? Liver mets as well as ?right shoulder mets (large destructive lesion of glenoid) on chemotherapy followed by Dr. Arbutus Ped. Admitted 05/10/2013 after found down with right-sided weakness, right facial droop and abnormal speech. MRI of the brain showed multiple acute subacute infarcts throughout the cerebellum bilaterally and slightly larger on the left. Supratentorial bilateral infarcts greater on the left with largest area of acute infarction involving the posterior left operculum region. MRA of the head with atherosclerotic type changes. Echocardiogram with ejection fraction of 60% without emboli. Carotid Dopplers with no ICA stenosis. Patient did not receive TPA. Neurology following and patient on ASA for embolic infarcts due to hyper-coagulopathy  Subjective/Complaints: Poor sleep no other issues  Review of Systems - unable to do review of systems secondary to mental acuity  Objective: Vital Signs: Blood pressure 122/55, pulse 124, temperature 97.8 F (36.6 C), temperature source Oral, resp. rate 18, weight 60.691 kg (133 lb 12.8 oz), SpO2 93.00%. Dg Chest 2 View  05/14/2013   CLINICAL DATA:  Possible apical pneumothorax on shoulder x-ray.  EXAM: CHEST  2 VIEW  COMPARISON:  Right shoulder radiographs 05/14/2013 and chest radiographs 04/2013.  FINDINGS: Right jugular Port-A-Cath remains in place with tip overlying the mid to lower SVC, unchanged. The right lung is diminished in volume. There is new opacity in the right lung base which may reflect a subpulmonic effusion. Increased density in the right hilum is stable to slightly increased from prior chest radiograph. No definite pneumothorax is identified. The left lung is well inflated and clear. Calcific supraspinatus tendinosis is again noted.  IMPRESSION: 1. No definite pneumothorax identified. 2. Increased opacity in the right lung base,  which may reflect a subpulmonic effusion. 3. Stable to mildly increased right hilar opacity in the setting of treated lung cancer.   Electronically Signed   By: Sebastian Ache   On: 05/14/2013 17:45   Dg Shoulder Right  05/14/2013   CLINICAL DATA:  Pain.  Metastatic cancer.  EXAM: RIGHT SHOULDER - 2+ VIEW  COMPARISON:  Chest x-ray 05/10/2013.  FINDINGS: Port-A-Cath noted. Right medial lung field changes suggesting prior radiation therapy. A subtle right apical pneumothorax cannot be excluded.  Lucency is noted in the right scapula in the region of the upper bony glenoid. This is suspicious for lytic metastatic focus. Further evaluation with CT or MRI can be obtained. PET-CT can be obtained. No acute bony abnormality identified. There are degenerative changes present about the right shoulder. Evidence of calcific supraspinatus tendinitis noted.  IMPRESSION: 1. Possible lytic lesion in the right scapula in the region of the bony glenoid. Further evaluation with CT or MRI can be obtained. PET-CT can be obtained . 2. Post radiation therapy changes projected over the right mid lung field. 3. Port-A-Cath noted in good anatomic position. A subtle right apical pneumothorax cannot be excluded. These results were called by telephone at the time of interpretation on 05/14/2013 at 3:55 PM to Dr. Cyndie Mull , who verbally acknowledged these results.   Electronically Signed   By: Maisie Fus  Register   On: 05/14/2013 15:59   Results for orders placed during the hospital encounter of 05/15/13 (from the past 72 hour(s))  BASIC METABOLIC PANEL     Status: Abnormal   Collection Time    04/21/2013  5:45 AM      Result Value Range   Sodium 142  135 - 145 mEq/L   Potassium 3.5  3.5 - 5.1 mEq/L   Chloride 106  96 - 112 mEq/L   CO2 25  19 - 32 mEq/L   Glucose, Bld 96  70 - 99 mg/dL   BUN 19  6 - 23 mg/dL   Creatinine, Ser 1.61  0.50 - 1.10 mg/dL   Calcium 8.3 (*) 8.4 - 10.5 mg/dL   GFR calc non Af Amer 61 (*) >90 mL/min   GFR calc  Af Amer 71 (*) >90 mL/min   Comment: (NOTE)     The eGFR has been calculated using the CKD EPI equation.     This calculation has not been validated in all clinical situations.     eGFR's persistently <90 mL/min signify possible Chronic Kidney     Disease.  CBC     Status: Abnormal   Collection Time    05/04/2013  5:45 AM      Result Value Range   WBC 8.0  4.0 - 10.5 K/uL   RBC 3.55 (*) 3.87 - 5.11 MIL/uL   Hemoglobin 10.2 (*) 12.0 - 15.0 g/dL   HCT 09.6 (*) 04.5 - 40.9 %   MCV 87.6  78.0 - 100.0 fL   MCH 28.7  26.0 - 34.0 pg   MCHC 32.8  30.0 - 36.0 g/dL   RDW 81.1  91.4 - 78.2 %   Platelets 71 (*) 150 - 400 K/uL   Comment: PLATELET COUNT CONFIRMED BY SMEAR    Nursing note and vitals reviewed.  Constitutional: She appears well-developed and well-nourished.  HENT:  Head: Normocephalic and atraumatic.  Eyes: Conjunctivae are normal. Pupils are equal, round, and reactive to light.  Neck: Normal range of motion.  Cardiovascular: Normal rate and regular rhythm.  Respiratory: Effort normal. Wheezes: auditory wheezes and expiratory wheezes on RUL.  GI: Soft. Bowel sounds are normal. She exhibits no distension. There is tenderness (non specific tenderness--not reliable due to aphasia. ).  Neurological: She is alert.  Alert. Able to state name. Global aphasia--Not accurate with Y/N questions. Unable to point or follow basic commands without visual cues-- component of apraxia. Frustration noted.  MMT limited by apraxia Motor strength is trace in R deltoid, biceps, triceps, finger flexors and extensors, wrist flexors and extensors, hip flexors, knee flexors and extensors, ankle dorsiflexors, plantar flexors, invertors and , toe flexors and extensors, 4/5 on Left side UE and LE, except grip     Assessment/Plan: 1. Functional deficits secondary to Left fronto parietal infarct which require 3+ hours per day of interdisciplinary therapy in a comprehensive inpatient rehab setting. Physiatrist  is providing close team supervision and 24 hour management of active medical problems listed below. Physiatrist and rehab team continue to assess barriers to discharge/monitor patient progress toward functional and medical goals. Team conference today please see physician documentation under team conference tab, met with team face-to-face to discuss problems,progress, and goals. Formulized individual treatment plan based on medical history, underlying problem and comorbidities. FIM: FIM - Bathing Bathing Steps Patient Completed: Chest;Abdomen;Left upper leg Bathing: 1: Two helpers  FIM - Upper Body Dressing/Undressing Upper body dressing/undressing: 0: Wears gown/pajamas-no public clothing FIM - Lower Body Dressing/Undressing Lower body dressing/undressing: 1: Two helpers  FIM - Toileting Toileting: 0: Activity did not occur (Pt used bedpan)  FIM - Archivist Transfers: 0-Activity did not occur  FIM - Banker Devices: HOB elevated;Bed rails;Arm rests Bed/Chair Transfer: 2: Supine > Sit: Max A (lifting assist/Pt. 25-49%);2: Bed > Chair or W/C: Max A (lift  and lower assist)  FIM - Locomotion: Wheelchair Locomotion: Wheelchair: 0: Activity did not occur  Comprehension Comprehension Mode: Auditory Comprehension: 1-Understands basic less than 25% of the time/requires cueing 75% of the time  Expression Expression Mode: Verbal  Social Interaction Social Interaction: 2-Interacts appropriately 25 - 49% of time - Needs frequent redirection.  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  1. DVT  Prophylaxis/Anticoagulation: Mechanical: Sequential compression devices, below knee Bilateral lower extremities---recheck platelets as dropping.  2. Pain Management: Continue MS contin with prn oxycodone for now. Palliative XRT to Right scapula planned tommorrow  3. Mood: Will continue to provide ego support--frustrated due to global aphasia. LCSW to follow for evaluation as aphasia improves.  4. Neuropsych: This patient is not capable of making decisions on her own behalf.  5. Gray coli UTI: continue cipro. Antibiotic D# 5/7  6. NSTEMI: Likely due demand ischemia from stroke and question of cardioembolic event. Continue ASA and lipitor.  7. Thrombocytopenia: likely due to chemo. Will continue to [facility] for signs of bleeding. H/H with steady drop. Check stool guaiacs.  8. Lung cancer with mets to liver? and right glenohumeral area: Continue Tarceva. Discontinue IVF to avoid overload.  9.  Hematuria may be related to thrombocytopenia may be related to UTI, cont Cipro  LOS (Days) 1 A FACE TO FACE EVALUATION WAS PERFORMED  [name] 04/21/2013, 1:46 PM

## 2013-05-16 NOTE — Progress Notes (Signed)
Nurse called to state that patient was unresponsive with sonorous breathing. Code stroke activated due to concerns of new. stroke v/s extension v/s bleed. MRI positive for new stroke and patient transferred to [facility] team 10 for closer monitoring.

## 2013-05-16 NOTE — Progress Notes (Signed)
Physical Therapy Note  Patient Details  Name: [name] MRN: [id number] Date of Birth: 05/29/1932 Today's Date: 05/03/2013  1135-1200 (25 minutes) individual  Pain : pt reports unrated pain Rt shoulder / premedicated\ Other: Oxygen @ 2 L  [place]  Focus of treatment: bed mobility/ transfer training Treatment: Pt in wc upon arrival ; transfer level scoot max assist; max assist wc setup ; sit to supine mod assist; supine to sit max assist; returned to room with quick release belt in place; husband present.    Chrisann Melaragno,JIM 05/03/2013, 11:55 AM

## 2013-05-16 NOTE — Progress Notes (Signed)
1940 making rounds/report off shift and noted pt moaning/crying. Upon assessment noted pt unresponsive to stimuli, would not open eyes only grimace to sternal rub and stimuli. Totally different from assessment at 1800. PT had been slow to respond with rt facial droop and global aphasia however able to take meds whole with applesauce and answer if she wanted more to drink and make needs known.  1940;  Pt noted to be gritting teeth and moaning. BP 113/68, Pulse 116 CBG 165 02 sat 93% on 2l/min [place]. CODE [employer]OKE called and notified Pam Love PAC of findings. Helle RRT to unit to assist to transfer pt to CT dept. RN on unit notified husband of event and asked him to come back to the hocpital. Back to room after CT and met in room by Dr. Mathews Robinsons RRT nurse. Assessed pt and changes in alertness noted since 1800 when pt was last given po medication and LSN time by staff. Husband and dtr to unit. Dr Roseanne Reno spoke with husband and order for MRI noted. Pt transported by bed back to radiology for MRI and then MRA. Noted rt frontal stroke and ? Seizures. Marissa Nestle [facility] contacted regarding results of MRI/MRA and recommended discharge to 4N. Pt transported back to 4W pending bed on 4 N for discharge . Report called to nurse on 4N and patient was transferred via bed 02 @4l /Min [place].  Still occasional moaning and grimacing however unresponsive to stimuli with exception of opening lft eye slightly. No other changes noted since found ~1940. Family made aware of transfer and reason for transfer.  Pamelia Hoit

## 2013-05-16 NOTE — Progress Notes (Signed)
Patient information reviewed and entered into eRehab system by Dolores Ewing, RN, CRRN, PPS Coordinator.  Information including medical coding and functional independence measure will be reviewed and updated through discharge.     Per nursing patient was given "Data Collection Information Summary for Patients in Inpatient Rehabilitation Facilities with attached "Privacy Act Statement-Health Care Records" upon admission.  

## 2013-05-16 NOTE — Progress Notes (Signed)
SLP Cancellation Note  Patient Details Name: [name] MRN: [id number] DOB: January 06, 1932   Cancelled treatment:        Patient missed 1 hour SLP evaluation 11/26 due to scheduling error; patient at [facility] for radiation treatment during scheduled eval time.  Defer completion of eval until 11/28.  [name], M.A., CCC-SLP [phone]  [name] 05-23-13, 1:33 PM

## 2013-05-16 NOTE — Progress Notes (Signed)
  Radiation Oncology         [phone] ________________________________  Name: [name] MRN: [id number]  Date: 04/23/2013  DOB: 1932-05-08  INPATIENT  SIMULATION AND TREATMENT PLANNING NOTE  DIAGNOSIS:  79 yo woman with painful right scapular metastasis from metastatic lung cancer  NARRATIVE:  The patient was brought to the CT Simulation planning suite.  Identity was confirmed.  All relevant records and images related to the planned course of therapy were reviewed.  The patient freely provided informed written consent to proceed with treatment after reviewing the details related to the planned course of therapy. The consent form was witnessed and verified by the simulation staff.  Then, the patient was set-up in a stable reproducible  supine position for radiation therapy.  CT images were obtained.  Surface markings were placed.  The CT images were loaded into the planning software.  Then the target and avoidance structures were contoured.  Treatment planning then occurred.  The radiation prescription was entered and confirmed.  Then, I designed and supervised the construction of a total of 3 medically necessary complex treatment devices in the.  I have requested : MLC's.   PLAN:  The patient will receive 8 Gy in 1 fraction.  ________________________________  Artist Pais Kathrynn Running, M.D.

## 2013-05-16 NOTE — Progress Notes (Signed)
INITIAL NUTRITION ASSESSMENT  DOCUMENTATION CODES Per approved criteria  -Not Applicable   INTERVENTION: - Continue Ensure Complete BID, each supplement provides 350 kcal, 13 g protein - Encourage adequate intake of meals and supplements - RD to follow per nutrition care plan  NUTRITION DIAGNOSIS: Inadequate oral intake related to CVA as evidenced by <30% meal intake.   Goal: Patient will meet >/=90% of estimated nutrition needs  Monitor:  PO intake, weight, labs, I/Os  Reason for Assessment: Malnutrition screening tool  79 y.o. 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 Admitted after CA with hemiplegia and expressive aphasia. Patient currently at [facility] for radiation treatment, so unable to obtain history. However, she has a recent history of poor oral intake during hospital admission. She is currently receiving Ensure Complete BID. Her weight has been consistent over the last 5-6 months, but her weight is down 9% since last year with history of malnutrition. Suspect some degree of malnutrition currently, but unable to diagnose at this time.   Height: Ht Readings from Last 1 Encounters:  05/10/13 5\' 4"  (1.626 m)    Weight: Wt Readings from Last 1 Encounters:  05/15/13 133 lb 12.8 oz (60.691 kg)    Ideal Body Weight: 120 pounds  % Ideal Body Weight: 111%  Wt Readings from Last 10 Encounters:  05/15/13 133 lb 12.8 oz (60.691 kg)  05/10/13 125 lb (56.7 kg)  04/17/13 134 lb 11.2 oz (61.1 kg)  03/20/13 135 lb 6.4 oz (61.417 kg)  02/20/13 135 lb 12.8 oz (61.598 kg)  01/18/13 135 lb 9.6 oz (61.508 kg)  12/19/12 134 lb 1.6 oz (60.827 kg)  11/22/12 130 lb 1.6 oz (59.013 kg)  10/26/12 128 lb 11.2 oz (58.378 kg)  09/28/12 126 lb 3.2 oz (57.244 kg)    Usual Body Weight: 135 pounds  % Usual Body Weight: 99%  BMI:  Body mass index is 22.96 kg/(m^2). Patient is normal  weight  Estimated Nutritional Needs: Kcal: 1400-1500 kcal Protein: 75-85 g Fluid: >1.8 L/day  Skin: Intact  Diet Order: Cardiac  EDUCATION NEEDS: -No education needs identified at this time   Intake/Output Summary (Last 24 hours) at 06/09/13 1441 Last data filed at Jun 09, 2013 0656  Gross per 24 hour  Intake     20 ml  Output      0 ml  Net     20 ml    Last BM: PTA   Labs:   Recent Labs Lab 05/14/13 0600 05/15/13 0545 06/09/13 0545  NA 141 140 142  K 3.3* 3.8 3.5  CL 108 107 106  CO2 24 25 25   BUN 19 18 19   CREATININE 0.72 0.82 0.88  CALCIUM 8.2* 8.2* 8.3*  GLUCOSE 90 102* 96    CBG (last 3)  No results found for this basename: GLUCAP,  in the last 72 hours  Scheduled Meds: . aspirin EC  325 mg Oral Daily  . atorvastatin  10 mg Oral q1800  . ciprofloxacin  250 mg Oral BID  . erlotinib  100 mg Oral QAC breakfast  . feeding supplement (ENSURE COMPLETE)  237 mL Oral BID BM  . morphine  15 mg Oral Q12H  . oxyCODONE  5 mg Oral BID    Continuous Infusions:   Past Medical History  Diagnosis Date  . Lung mass   . History of radiation therapy 7/31/212-03/08/2011    right upper lobe lung adenocarcinoma  . COPD (  chronic obstructive pulmonary disease)   . Hypertension   . Hypercholesterolemia   . Osteoporosis   . History of chemotherapy     carboplatin/paclitaxel  . lung ca July 2012    , right upper lobe  . Liver metastases dx'd 06/16/12    Past Surgical History  Procedure Laterality Date  . Abdominal hysterectomy      s/p for fibroid tumor  . Portacath placement      right ij   . Back surgery      Linnell Fulling, RD, LDN Pager #: [phone] After-Hours Pager #: [phone]

## 2013-05-16 NOTE — Consult Note (Signed)
Referring Physician: Dr. Rodman Pickle    Chief Complaint: Patient found by the nursing staff unresponsive.  HPI: LINDSY CERULLO is an 79 y.o. 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 She was subsequently discharged and admitted to inpatient rehabilitation on 4 W. She was found unresponsive by nursing staff and was noted to have bleeding from the mouth as well as to rule out respirations. She was afebrile, slightly tachycardic with unremarkable blood pressure. She responded only minimally to sternal rub. She had less movement of her right extremities compared to left extremities. Stat CT scan of the head showed no acute changes, with expected evolutionary changes of her stroke from 6 days earlier. MRI showed multiple new areas of acute infarction involving the posterior left temporal region as well as scattered new areas of infarction involving the right occipital lobe and right operculum. MRA showed multiple proximal stenosis within left MCA branches. Atherosclerotic changes were noted. No large vessel occlusion was seen. Family members indicated patient was unresponsive at the onset of stroke 6 days ago. No frank seizure activity was reported.  LSN: [id number] on 04/27/2013 tPA Given: No: Acute stroke 6 days ago. Her MRankin: 4  Past Medical History  Diagnosis Date  . Lung mass   . History of radiation therapy 7/31/212-03/08/2011    right upper lobe lung adenocarcinoma  . COPD (chronic obstructive pulmonary disease)   . Hypertension   . Hypercholesterolemia   . Osteoporosis   . History of chemotherapy     carboplatin/paclitaxel  . lung ca July 2012    , right upper lobe  . Liver metastases dx'd 06/16/12    Family History  Problem Relation Age of Onset  . Breast cancer Mother   . Breast cancer Sister     2 sisters   . Ovarian cancer Maternal Aunt   .  Breast cancer Maternal Grandmother      Medications: I have reviewed the patient's current medications.  ROS: History obtained from chart review  General ROS: negative for - chills, fatigue, fever, night sweats, weight gain or weight loss Psychological ROS: negative for - behavioral disorder, hallucinations, memory difficulties, mood swings or suicidal ideation Ophthalmic ROS: negative for - blurry vision, double vision, eye pain or loss of vision ENT ROS: negative for - epistaxis, nasal discharge, oral lesions, sore throat, tinnitus or vertigo Allergy and Immunology ROS: negative for - hives or itchy/watery eyes Hematological and Lymphatic ROS: negative for - bleeding problems, bruising or swollen lymph nodes Endocrine ROS: negative for - galactorrhea, hair pattern changes, polydipsia/polyuria or temperature intolerance Respiratory ROS: Known lung mass with metastasis Cardiovascular ROS: negative for - chest pain, dyspnea on exertion, edema or irregular heartbeat Gastrointestinal ROS: No metastasis to the liver Genito-Urinary ROS: negative for - dysuria, hematuria, incontinence or urinary frequency/urgency Musculoskeletal ROS: negative for - joint swelling or muscular weakness Neurological ROS: as noted in HPI Dermatological ROS: negative for rash and skin lesion changes  Physical Examination: Blood pressure 102/67, pulse 114, temperature 97.8 F (36.6 C), temperature source Oral, resp. rate 19, weight 59.421 kg (131 lb), SpO2 97.00%.  Neurologic Examination: Patient was unresponsive to verbal and tactile stimulation. She was minimally responsive to sternal rub with grimacing. Respirations were normal and regular. Pupils were 2 mm each and equal and react minimally to light. Extraocular movements were intact to oculocephalic maneuvers. Face was symmetrical with no signs  of focal weakness. Muscle tone was flaccid throughout. Patient had no spontaneous movements and no abnormal  posturing. Deep tendon reflexes were 2+ and symmetrical. Plantar responses were extensor bilaterally.  Dg Chest 2 View  05/06/2013   CLINICAL DATA:  Follow-up of pleural effusion.  EXAM: CHEST  2 VIEW  COMPARISON:  May 14, 2013 chest x-ray  FINDINGS: There remains increased density posteriorly in the right hemi thorax not significantly changed from the earlier study. This may reflect a loculated pleural effusion or subpulmonic effusion as previously suggested. This has increased in conspicuity since the study of May 10, 2013. There remain mildly increased interstitial markings in the right lung which are unchanged. Similar findings are noted on the left. The left lung is better aerated than is the right. The cardiopericardial silhouette is not enlarged. The Port-A-Cath type appliance is unchanged in appearance. The observed portions of the bony thorax exhibit no acute abnormalities.  IMPRESSION: There is no significant change in the appearance of the right hemi thorax since the study of May 14, 2013. The soft tissue density posteriorly in the right lower hemi thorax has increased since May 10, 2013 and likely reflects a pleural effusion or possible subpulmonic effusion.   Electronically Signed   By: David  [place]   On: 05/15/2013 16:02   Ct Head Wo Contrast  04/24/2013   CLINICAL DATA:  Right-sided weakness.  Found unresponsive.  EXAM: CT HEAD WITHOUT CONTRAST  TECHNIQUE: Contiguous axial images were obtained from the base of the skull through the vertex without intravenous contrast.  COMPARISON:  MRI brain 05/10/2013.  FINDINGS: Expected evolution of multiple subacute infarcts is evident. This is most evident in the posterior left frontal and parietal lobe. Involving infarcts are present in the cerebellum bilaterally as well. There is no hemorrhage. No definite new infarct is present. Given the extent of previous infarcts, some progression cannot be excluded.  The ventricles are of  normal size. No significant extra-axial fluid collection is present.  The paranasal sinuses and mastoid air cells are clear. The right maxillary sinus is shrunken, compatible with chronic disease. No active disease is evident.  IMPRESSION: 1. Expected evolution of multiple infarcts with most confluent area involving posterior left frontal lobe and parietal lobe. 2. No definite new infarct or hemorrhage.   Electronically Signed   By: Gennette Pac M.D.   On: 04/26/2013 20:03   Mr Maxine Glenn Head Wo Contrast  05/20/2013   CLINICAL DATA:  New onset mental status changes.  Unresponsive.  EXAM: MRI HEAD WITHOUT CONTRAST  MRA HEAD WITHOUT CONTRAST  TECHNIQUE: Multiplanar, multiecho pulse sequences of the brain and surrounding structures were obtained without intravenous contrast. Angiographic images of the head were obtained using MRA technique without contrast.  COMPARISON:  CT head from the same day.  MRI brain 05/10/2013.  FINDINGS: MRI HEAD FINDINGS  There is expected evolution of multiple prior infarcts. Multiple areas of new infarction are present including the inferior right cerebellum and superior left cerebellum. There are at least 2 focal areas in the posterior left temporal lobe. The confluent left parietal area demonstrates some increase. There is scattered new areas in the right occipital lobe and right frontal operculum.  No hemorrhage or mass lesion is present. The ventricles are proportionate to the degree of atrophy. Underlying white matter disease is stable. More confluent FLAIR changes are associated with evolving infarcts, particularly in the left frontal and parietal lobe.  Low is present in the major intracranial arteries. The globes and orbits  are intact. The paranasal sinuses are clear. The right maxillary sinus is shrunken compatible with chronic disease.  MRA HEAD FINDINGS  The internal carotid arteries demonstrate similar irregularity in the cavernous carotid segments without significant stenosis.  The right A1 segment is aplastic. The left A1 segment is normal. Marked attenuation of left MCA branch vessels is stable. More distal attenuation is present on the right.  The left vertebral artery is hypoplastic and terminates at the PICA. There is moderate irregularity and narrowing of the distal right vertebral artery and proximal basilar artery. The posterior cerebral arteries are of fetal type. There is moderate attenuation of distal branch vessels.  IMPRESSION: 1. Multiple new areas of acute infarction as detailed above. 2. Expected evolution of prior infarcts. 3. Moderate small vessel disease. 4. Multiple proximal stenoses within the left MCA branch vessels. 5. Atherosclerotic irregularity within the cavernous carotid arteries and distal right vertebral artery. 6. Overall stable appearance of the MRA.   Electronically Signed   By: Gennette Pac M.D.   On: 05/20/2013 21:30   Mr Brain Wo Contrast  05/04/2013   CLINICAL DATA:  New onset mental status changes.  Unresponsive.  EXAM: MRI HEAD WITHOUT CONTRAST  MRA HEAD WITHOUT CONTRAST  TECHNIQUE: Multiplanar, multiecho pulse sequences of the brain and surrounding structures were obtained without intravenous contrast. Angiographic images of the head were obtained using MRA technique without contrast.  COMPARISON:  CT head from the same day.  MRI brain 05/10/2013.  FINDINGS: MRI HEAD FINDINGS  There is expected evolution of multiple prior infarcts. Multiple areas of new infarction are present including the inferior right cerebellum and superior left cerebellum. There are at least 2 focal areas in the posterior left temporal lobe. The confluent left parietal area demonstrates some increase. There is scattered new areas in the right occipital lobe and right frontal operculum.  No hemorrhage or mass lesion is present. The ventricles are proportionate to the degree of atrophy. Underlying white matter disease is stable. More confluent FLAIR changes are associated  with evolving infarcts, particularly in the left frontal and parietal lobe.  Low is present in the major intracranial arteries. The globes and orbits are intact. The paranasal sinuses are clear. The right maxillary sinus is shrunken compatible with chronic disease.  MRA HEAD FINDINGS  The internal carotid arteries demonstrate similar irregularity in the cavernous carotid segments without significant stenosis. The right A1 segment is aplastic. The left A1 segment is normal. Marked attenuation of left MCA branch vessels is stable. More distal attenuation is present on the right.  The left vertebral artery is hypoplastic and terminates at the PICA. There is moderate irregularity and narrowing of the distal right vertebral artery and proximal basilar artery. The posterior cerebral arteries are of fetal type. There is moderate attenuation of distal branch vessels.  IMPRESSION: 1. Multiple new areas of acute infarction as detailed above. 2. Expected evolution of prior infarcts. 3. Moderate small vessel disease. 4. Multiple proximal stenoses within the left MCA branch vessels. 5. Atherosclerotic irregularity within the cavernous carotid arteries and distal right vertebral artery. 6. Overall stable appearance of the MRA.   Electronically Signed   By: Gennette Pac M.D.   On: 05/14/2013 21:30    Assessment: 79 y.o. female history of stroke 6 days ago as well is metastatic lung cancer, with new acute cerebral infarctions involving right hemisphere, likely embolic. Etiology for loss of consciousness is unclear. Seizure activity is suspected, however.  Stroke Risk Factors - hypercoagulable state, hyperlipidemia  and hypertension  Plan: 1. PT consult, OT consult, Speech consult 2. Prophylactic therapy-Antiplatelet med: Aspirin  3. Risk factor modification 4. Telemetry monitoring 5. EEG, routine adult study in the a.m. 6. Keppra 1000 mg IV loading dose, followed by 500 mg every 12 hours for maintenance.  C.R.  Roseanne Reno, MD [facility] [phone]  06/15/13, 10:10 PM

## 2013-05-16 NOTE — Evaluation (Signed)
Occupational Therapy Assessment and Plan  Patient Details  Name: [name] MRN: [id number] Date of Birth: [birth year]  OT Diagnosis: abnormal posture, acute pain, cognitive deficits, disturbance of vision, hemiplegia affecting dominant side, muscle weakness (generalized) and pain in joint Rehab Potential: Rehab Potential: Good     ELOS: 26-28 days   Today's Date: 05/17/2013 Time: 10:01-11:05  Time Calculation (min): 64 mins  Problem List:  Patient Active Problem List   Diagnosis Date Noted  . Metastasis to bone 05/14/2013  . Protein-calorie malnutrition, severe 05/13/2013  . CVA (cerebral infarction), right hemiplegia and expressive aphasia 05/10/2013  . Thrombocytopenia 05/10/2013  . UTI (lower urinary tract infection) 05/10/2013  . Non-ST elevation MI (NSTEMI) 05/10/2013  . Peripheral vascular disease, unspecified 12/27/2011  . Cancer   . COPD (chronic obstructive pulmonary disease)   . Hypertension   . Hypercholesterolemia   . Osteoporosis   . Lung cancer 05/04/2011    Past Medical History:  Past Medical History  Diagnosis Date  . Lung mass   . History of radiation therapy 7/31/212-03/08/2011    right upper lobe lung adenocarcinoma  . COPD (chronic obstructive pulmonary disease)   . Hypertension   . Hypercholesterolemia   . Osteoporosis   . History of chemotherapy     carboplatin/paclitaxel  . lung ca July 2012    , right upper lobe  . Liver metastases dx'd 06/16/12   Past Surgical History:  Past Surgical History  Procedure Laterality Date  . Abdominal hysterectomy      s/p for fibroid tumor  . Portacath placement      right ij   . Back surgery      Assessment & Plan Clinical Impression: Patient is a 79 y.o. year old female with recent admission to the hospital on 05/10/2013 after found down with right-sided weakness, right facial droop and abnormal speech.  MRI of the brain showed multiple acute subacute infarcts throughout the cerebellum bilaterally  and slightly larger on the left. Supratentorial bilateral infarcts greater on the left with largest area of acute infarction involving the posterior left operculum region. MRA of the head with atherosclerotic type changes. Echocardiogram with ejection fraction of 60% without emboli.  Patient transferred to CIR on 05/15/2013 .    Patient currently requires total with basic self-care skills secondary to muscle weakness, decreased oxygen support, impaired timing and sequencing, abnormal tone, unbalanced muscle activation, motor apraxia, decreased coordination and decreased motor planning, hemianopsia, decreased midline orientation, decreased attention to right and right side neglect and decreased initiation, decreased attention, decreased awareness and decreased problem solving.  Prior to hospitalization, patient could complete ADLS with independent .  Patient will benefit from skilled intervention to decrease level of assist with basic self-care skills prior to discharge home with care partner and family both providing 24 hour assist.  Anticipate patient will require 24 hour supervision and minimal physical assistance and follow up home health.  OT - End of Session Activity Tolerance: Decreased this session;Tolerates 30+ min activity with multiple rests Endurance Deficit: Yes Endurance Deficit Description: Pt with dyspnea 2/4 after standing for 30 seconds OT Assessment Rehab Potential: Good Barriers to Discharge: Decreased caregiver support Barriers to Discharge Comments: will need family to arrange for 24 hour supervision OT Patient demonstrates impairments in the following area(s): Balance;Vision;Cognition;Endurance;Motor;Pain;Perception;Safety;Sensory OT Basic ADL's Functional Problem(s): Eating;Grooming;Bathing;Dressing;Toileting OT Transfers Functional Problem(s): Toilet OT Additional Impairment(s): Fuctional Use of Upper Extremity OT Plan OT Intensity: Minimum of 1-2 x/day, 45 to 90  minutes OT Frequency: 5  out of 7 days OT Duration/Estimated Length of Stay: 26-28 days OT Treatment/Interventions: Balance/vestibular training;DME/adaptive equipment instruction;Self Care/advanced ADL retraining;Neuromuscular re-education;Patient/family education;Discharge planning;Community reintegration;Functional electrical stimulation;Functional mobility training;Therapeutic Activities;UE/LE Coordination activities;UE/LE Strength taining/ROM;Therapeutic Exercise OT Self Feeding Anticipated Outcome(s): supervision OT Basic Self-Care Anticipated Outcome(s): min assist OT Toileting Anticipated Outcome(s): min assist level OT Bathroom Transfers Anticipated Outcome(s): min assist level OT Recommendation Patient destination: Home Follow Up Recommendations: Home health OT Equipment Recommended: 3 in 1 bedside comode;Wheelchair (measurements);Wheelchair cushion (measurements)   OT Evaluation Precautions/Restrictions  Precautions Precautions: Fall Precaution Comments: right hemiparesis, right inattention and visual field cut, monitor O2 sats Restrictions Weight Bearing Restrictions: No  Vital Signs Therapy Vitals Pulse Rate: 124 (during therapy) Patient Position, if appropriate: Sitting Oxygen Therapy SpO2: 93 % O2 Device: Nasal cannula O2 Flow Rate (L/min): 2 L/min Pulse Oximetry Type: Intermittent Pain Pain Assessment Pain Assessment: Faces Pain Score: 1  Faces Pain Scale: Hurts even more Pain Type: Acute pain Pain Location: Shoulder Pain Orientation: Right Pain Descriptors / Indicators: Aching Pain Onset: On-going Patients Stated Pain Goal: 2 Pain Intervention(s): Medication (See eMAR) Home Living/Prior Functioning Home Living Family/patient expects to be discharged to:: Private residence Living Arrangements: Spouse/significant other Available Help at Discharge: Family;Available 24 hours/day Type of Home: House Home Access: Stairs to enter [employer] of  Steps: 1 Entrance Stairs-Rails: None Home Layout: Multi-level;1/2 bath on main level;Bed/bath upstairs;Other (Comment) (no beds on main level) Alternate Level Stairs-Number of Steps: 12 Alternate Level Stairs-Rails: Right Additional Comments: one bathroom (2nd level) with tub; other bathroom (2nd level) with shower  Lives With: Spouse Prior Function Level of Independence: Independent with basic ADLs;Independent with homemaking with ambulation;Independent with gait;Independent with transfers  Able to Take Stairs?: Yes Driving: No (pt able to drive but chose not to) Vocation: Retired Leisure: Hobbies-yes (Comment) Comments: shopping, sewing, crossword puzzles ADL  See FIM scale for details  Vision/Perception  Vision - History Baseline Vision: Wears glasses all the time Patient Visual Report: Blurring of vision Vision - Assessment Eye Alignment: Impaired (comment) (Left gaze preference) Vision Assessment: Vision tested Alignment/Gaze Preference: Head turned;Gaze left Tracking/Visual Pursuits: Decreased smoothness of horizontal tracking;Decreased smoothness of vertical tracking;Impaired - to be further tested in functional context Visual Fields: Right homonymous hemianopsia Perception Perception: Impaired Inattention/Neglect: Does not attend to right visual field;Does not attend to right side of body Praxis Praxis: Impaired Praxis Impairment Details: Ideation Praxis-Other Comments: Pt with decreased ability to initiate simple movements to command such as reach for washcloth.  May be more receptive difficulties but will continue to monitor.  Cognition Overall Cognitive Status: Impaired/Different from baseline Arousal/Alertness: Awake/alert Orientation Level: Disoriented to place;Disoriented to time Attention: Focused Focused Attention: Appears intact Sustained Attention: Impaired Sustained Attention Impairment: Functional basic Memory: Impaired Memory Impairment: Decreased short  term memory Decreased Short Term Memory: Functional basic Awareness: Impaired Awareness Impairment: Intellectual impairment Problem Solving: Impaired Comments: Pt with decreased ability to follow one step commands related to ADL tasks.  Able to follow approximately 25 % during bathing. Sensation Sensation Light Touch: Impaired Detail Light Touch Impaired Details: Impaired RUE Stereognosis: Impaired Detail Stereognosis Impaired Details: Impaired RUE Hot/Cold: Impaired Detail Hot/Cold Impaired Details: Impaired RUE Proprioception: Impaired Detail Proprioception Impaired Details: Impaired RUE Coordination Gross Motor Movements are Fluid and Coordinated: No Fine Motor Movements are Fluid and Coordinated: No Coordination and Movement Description: Pt is currently Brunnstrum stage I in the right arm and hand.   Motor  Motor Motor: Hemiplegia;Abnormal postural alignment and control Motor - Skilled Clinical Observations:  Pt with flexed trunk in sitting with posterior pelvic tilt.  Decreased trunk extension or cervical extension with standing and increased lean to the right side. Mobility  Transfers Transfers: Sit to Stand Sit to Stand: From chair/3-in-1;With upper extremity assist;1: +1 Total assist Sit to Stand Details: Manual facilitation for weight shifting;Verbal cues for technique Stand to Sit: 1: +1 Total assist;To chair/3-in-1 Stand to Sit Details (indicate cue type and reason): Verbal cues for technique;Manual facilitation for weight shifting  Trunk/Postural Assessment  Cervical Assessment Cervical Assessment: Exceptions to [facility] Cervical Strength Overall Cervical Strength Comments: Pt demonstrates resting cervical rotation to the right and slight cervical flexion. Postural Control Postural Control: Deficits on evaluation Trunk Control: Pt sits in a posterior pelvic tilt with increased weightbearing on the right hip with righ trunk passive elongation.  Balance Balance Balance  Assessed: Yes Static Sitting Balance Static Sitting - Balance Support: Feet supported;Left upper extremity supported Static Sitting - Level of Assistance: 2: Max assist Dynamic Sitting Balance Dynamic Sitting - Balance Support: Left upper extremity supported;Feet supported Dynamic Sitting - Level of Assistance: 2: Max assist Static Standing Balance Static Standing - Balance Support: Left upper extremity supported Static Standing - Level of Assistance: 1: +1 Total assist Extremity/Trunk Assessment RUE Assessment RUE Assessment: Exceptions to [facility] RUE Strength RUE Overall Strength Comments: Pt with PROM WFLS for all joints except shoulder.  Pt with 1 finger inferior subluxation with pain during shoulder flexion greater than 120 degrees.  No active movement noted in the RUE functionally. LUE Assessment LUE Assessment: Within Functional Limits (during functional activity.  )  FIM:  FIM - Grooming Grooming Steps: Wash, rinse, dry face Grooming: 3: Patient completes 2 of 4 or 3 of 5 steps FIM - Bathing Bathing Steps Patient Completed: Chest;Abdomen;Left upper leg Bathing: 1: Two helpers FIM - Upper Body Dressing/Undressing Upper body dressing/undressing: 0: Wears gown/pajamas-no public clothing FIM - Lower Body Dressing/Undressing Lower body dressing/undressing: 1: Two helpers FIM - Banker Devices: HOB elevated;Bed rails;Arm rests Bed/Chair Transfer: 2: Supine > Sit: Max A (lifting assist/Pt. 25-49%);2: Bed > Chair or W/C: Max A (lift and lower assist)   Refer to Care Plan for Long Term Goals  Recommendations for other services: None  Discharge Criteria: Patient will be discharged from OT if patient refuses treatment 3 consecutive times without medical reason, if treatment goals not met, if there is a change in medical status, if patient makes no progress towards goals or if patient is discharged from hospital.  The above assessment, treatment  plan, treatment alternatives and goals were discussed and mutually agreed upon: by patient and by family  Began education on selfcare retraining during session.  Pt worked on sitting and standing balance, RUE positioning, R side visual attention, and sequencing during ADL task.  Pt overall needs total assist for all bathing, dressing, and functional sit to stand at this time.    Lupie Sawa OTR/L 04/24/2013, 12:32 PM

## 2013-05-17 ENCOUNTER — Encounter (HOSPITAL_COMMUNITY): Payer: Self-pay | Admitting: Internal Medicine

## 2013-05-17 ENCOUNTER — Inpatient Hospital Stay (HOSPITAL_COMMUNITY): Payer: Medicare Other

## 2013-05-17 DIAGNOSIS — R0989 Other specified symptoms and signs involving the circulatory and respiratory systems: Secondary | ICD-10-CM

## 2013-05-17 DIAGNOSIS — J449 Chronic obstructive pulmonary disease, unspecified: Secondary | ICD-10-CM

## 2013-05-17 DIAGNOSIS — R52 Pain, unspecified: Secondary | ICD-10-CM

## 2013-05-17 DIAGNOSIS — I639 Cerebral infarction, unspecified: Secondary | ICD-10-CM | POA: Diagnosis present

## 2013-05-17 DIAGNOSIS — C7951 Secondary malignant neoplasm of bone: Secondary | ICD-10-CM

## 2013-05-17 DIAGNOSIS — R0609 Other forms of dyspnea: Secondary | ICD-10-CM

## 2013-05-17 DIAGNOSIS — J4489 Other specified chronic obstructive pulmonary disease: Secondary | ICD-10-CM

## 2013-05-17 DIAGNOSIS — I6350 Cerebral infarction due to unspecified occlusion or stenosis of unspecified cerebral artery: Secondary | ICD-10-CM

## 2013-05-17 DIAGNOSIS — Z515 Encounter for palliative care: Secondary | ICD-10-CM

## 2013-05-17 DIAGNOSIS — C801 Malignant (primary) neoplasm, unspecified: Secondary | ICD-10-CM

## 2013-05-17 DIAGNOSIS — G9340 Encephalopathy, unspecified: Secondary | ICD-10-CM

## 2013-05-17 DIAGNOSIS — R0600 Dyspnea, unspecified: Secondary | ICD-10-CM

## 2013-05-17 LAB — COMPREHENSIVE METABOLIC PANEL
AST: 90 U/L — ABNORMAL HIGH (ref 0–37)
Albumin: 1.9 g/dL — ABNORMAL LOW (ref 3.5–5.2)
BUN: 18 mg/dL (ref 6–23)
Chloride: 104 mEq/L (ref 96–112)
Creatinine, Ser: 0.73 mg/dL (ref 0.50–1.10)
GFR calc Af Amer: >90 mL/min (ref >90)
Glucose, Bld: 94 mg/dL (ref 70–99)
Total Bilirubin: 1.3 mg/dL — ABNORMAL HIGH (ref 0.3–1.2)
Total Protein: 4.7 g/dL — ABNORMAL LOW (ref 6.0–8.3)

## 2013-05-17 LAB — CBC WITH DIFFERENTIAL/PLATELET
Eosinophils Absolute: 0.3 10*3/uL (ref 0.0–0.7)
Eosinophils Relative: 4 % (ref 0–5)
HCT: 30.1 % — ABNORMAL LOW (ref 36.0–46.0)
Hemoglobin: 9.7 g/dL — ABNORMAL LOW (ref 12.0–15.0)
Lymphs Abs: 0.4 10*3/uL — ABNORMAL LOW (ref 0.7–4.0)
MCH: 28.5 pg (ref 26.0–34.0)
MCV: 88.5 fL (ref 78.0–100.0)
Monocytes Absolute: 0.5 10*3/uL (ref 0.1–1.0)
Monocytes Relative: 5 % (ref 3–12)
RBC: 3.4 MIL/uL — ABNORMAL LOW (ref 3.87–5.11)
RDW: 14.9 % (ref 11.5–15.5)

## 2013-05-17 MED ORDER — LEVALBUTEROL HCL 0.63 MG/3ML IN NEBU
0.6300 mg | INHALATION_SOLUTION | Freq: Four times a day (QID) | RESPIRATORY_TRACT | Status: DC | PRN
  Filled 2013-05-17: qty 3

## 2013-05-17 MED ORDER — ASPIRIN 325 MG PO TABS
325.0000 mg | ORAL_TABLET | Freq: Every day | ORAL | Status: DC
Start: 2013-05-17 — End: 2013-05-17
  Filled 2013-05-17: qty 1

## 2013-05-17 MED ORDER — LORAZEPAM 2 MG/ML IJ SOLN
1.0000 mg | INTRAMUSCULAR | Status: DC | PRN
  Administered 2013-05-17 (×2): 1 mg via INTRAVENOUS
  Filled 2013-05-17 (×2): qty 1

## 2013-05-17 MED ORDER — MORPHINE SULFATE 2 MG/ML IJ SOLN
1.0000 mg | INTRAMUSCULAR | Status: DC | PRN
  Administered 2013-05-17 (×2): 1 mg via INTRAVENOUS
  Filled 2013-05-17 (×2): qty 1

## 2013-05-17 MED ORDER — ASPIRIN 300 MG RE SUPP
300.0000 mg | Freq: Every day | RECTAL | Status: DC
  Administered 2013-05-17: 300 mg via RECTAL
  Filled 2013-05-17: qty 1

## 2013-05-17 MED ORDER — SODIUM CHLORIDE 0.9 % IV SOLN
1000.0000 mg | Freq: Once | INTRAVENOUS | Status: AC
  Administered 2013-05-17: 1000 mg via INTRAVENOUS
  Filled 2013-05-17: qty 10

## 2013-05-17 MED ORDER — SODIUM CHLORIDE 0.9 % IV SOLN
500.0000 mg | Freq: Two times a day (BID) | INTRAVENOUS | Status: DC
  Administered 2013-05-17 (×2): 500 mg via INTRAVENOUS
  Filled 2013-05-17 (×4): qty 5

## 2013-05-17 MED ORDER — MORPHINE SULFATE 25 MG/ML IV SOLN
1.0000 mg/h | INTRAVENOUS | Status: DC
  Administered 2013-05-17: 2 mg/h via INTRAVENOUS
  Administered 2013-05-17: 1 mg/h via INTRAVENOUS
  Filled 2013-05-17: qty 10

## 2013-05-17 MED ORDER — ENOXAPARIN SODIUM 40 MG/0.4ML ~~LOC~~ SOLN
40.0000 mg | SUBCUTANEOUS | Status: DC
  Administered 2013-05-17: 40 mg via SUBCUTANEOUS
  Filled 2013-05-17: qty 0.4

## 2013-05-17 MED ORDER — SENNOSIDES-DOCUSATE SODIUM 8.6-50 MG PO TABS: 1.0000 | ORAL_TABLET | Freq: Every evening | ORAL | Status: DC | PRN

## 2013-05-17 MED ORDER — CIPROFLOXACIN IN D5W 200 MG/100ML IV SOLN
200.0000 mg | Freq: Two times a day (BID) | INTRAVENOUS | Status: DC
  Administered 2013-05-17: 200 mg via INTRAVENOUS
  Filled 2013-05-17 (×3): qty 100

## 2013-05-17 MED ORDER — SODIUM CHLORIDE 0.9 % IV SOLN
INTRAVENOUS | Status: AC
  Administered 2013-05-17: 20 mL/h via INTRAVENOUS
  Administered 2013-05-17: 02:00:00 via INTRAVENOUS

## 2013-05-17 MED ORDER — BIOTENE DRY MOUTH MT LIQD
15.0000 mL | Freq: Two times a day (BID) | OROMUCOSAL | Status: DC
  Administered 2013-05-17: 15 mL via OROMUCOSAL

## 2013-05-17 NOTE — Progress Notes (Signed)
Around 52, RN wtinessed blood mixed in pt's saliva while dripping from pt's mouth. RN inspected pt's mucous membranes, but did not see any trauma. Amount of blood was minimal. Tongue is intact. Pt began to moan loudly and grimace while being repositioned for RN to inspect mouth. RN gave 1 mg Morphine IV for pain around 0531.  Suction is setup by pt's side. RN instructed family to keep pt on her side and the importance of keep her head elevated to prevent aspiration. Pt has been calm since then. Will continue to monitor mouth for blood. Salvadore Oxford, RN 05/17/13 [phone]

## 2013-05-17 NOTE — Evaluation (Signed)
Physical Therapy Assessment and Plan  Patient Details  Name: [name] MRN: [id number] Date of Birth: 04-29-1932  PT Diagnosis: Abnormal posture, Abnormality of gait, Coordination disorder, Hemiparesis dominant, Impaired cognition, Impaired sensation, Muscle weakness and Pain in R shoulder Rehab Potential: Good ELOS: 26-28 days   Today's Date: 05/07/2013 Time: 0900-1000  60 min  Problem List:  Patient Active Problem List   Diagnosis Date Noted  . CVA (cerebrovascular accident) 05/17/2013  . Acute encephalopathy 05/17/2013  . Metastasis to bone 05/14/2013  . Protein-calorie malnutrition, severe 05/13/2013  . CVA (cerebral infarction) 05/10/2013  . Thrombocytopenia 05/10/2013  . UTI (lower urinary tract infection) 05/10/2013  . Non-ST elevation MI (NSTEMI) 05/10/2013  . Peripheral vascular disease, unspecified 12/27/2011  . Cancer   . COPD (chronic obstructive pulmonary disease)   . Hypertension   . Hypercholesterolemia   . Osteoporosis   . Lung cancer 05/04/2011    Past Medical History:  Past Medical History  Diagnosis Date  . Lung mass   . History of radiation therapy 7/31/212-03/08/2011    right upper lobe lung adenocarcinoma  . COPD (chronic obstructive pulmonary disease)   . Hypertension   . Hypercholesterolemia   . Osteoporosis   . History of chemotherapy     carboplatin/paclitaxel  . lung ca July 2012    , right upper lobe  . Liver metastases dx'd 06/16/12   Past Surgical History:  Past Surgical History  Procedure Laterality Date  . Abdominal hysterectomy      s/p for fibroid tumor  . Portacath placement      right ij   . Back surgery     Assessment & Plan Clinical Impression: Patient is a 79 y.o. year old female with recent admission to the hospital on 05/10/2013 after found down with right-sided weakness, right facial droop and abnormal speech. MRI of the brain showed multiple acute subacute infarcts throughout the cerebellum bilaterally and  slightly larger on the left. Supratentorial bilateral infarcts greater on the left with largest area of acute infarction involving the posterior left operculum region. MRA of the head with atherosclerotic type changes. Echocardiogram with ejection fraction of 60% without emboli. Patient transferred to CIR on 05/15/2013 .   Patient currently requires max to total assist with mobility secondary to muscle weakness, decreased cardiorespiratoy endurance and decreased oxygen support, impaired timing and sequencing, unbalanced muscle activation, decreased coordination and decreased motor planning, field cut, decreased initiation, decreased attention, decreased awareness and decreased problem solving and decreased sitting balance, decreased standing balance, decreased postural control, hemiplegia and decreased balance strategies.  Prior to hospitalization, patient was independent  with mobility and lived with Spouse in a House home.  Home access is 1Stairs to enter.  Patient will benefit from skilled PT intervention to maximize safe functional mobility and minimize fall risk for planned discharge home with 24 hour assist.  Anticipate patient will benefit from follow up [facility] at discharge.  PT - End of Session Activity Tolerance: Tolerates 30+ min activity with multiple rests Endurance Deficit: Yes Endurance Deficit Description: Significant SOB (SpO2 >93%; HR <105 throughout) after squat-pivot transfer, following static standing x30 seconds. PT Assessment Rehab Potential: Good Barriers to Discharge: Decreased caregiver support;Inaccessible home environment Barriers to Discharge Comments: Husband unable to provide physical assist at this time secondary to recent surgery; bedroom and full bathroom on 2nd floor of pt home (12 steps, single R rail to access). PT Patient demonstrates impairments in the following area(s): Balance;Edema;Endurance;Motor;Pain;Sensory;Other (comment) (Attention, Awareness) PT Transfers  Functional  Problem(s): Bed Mobility;Bed to Chair;Car;Furniture PT Locomotion Functional Problem(s): Ambulation;Wheelchair Mobility;Stairs PT Plan PT Intensity: Minimum of 1-2 x/day ,45 to 90 minutes PT Frequency: 5 out of 7 days PT Duration Estimated Length of Stay: 26-28 days PT Treatment/Interventions: Ambulation/gait training;Balance/vestibular training;Cognitive remediation/compensation;Discharge planning;Disease management/prevention;DME/adaptive equipment instruction;Functional mobility training;Neuromuscular re-education;Pain management;Patient/family education;Stair training;Therapeutic Activities;Therapeutic Exercise;UE/LE Coordination activities;UE/LE Strength taining/ROM;Visual/perceptual remediation/compensation;Wheelchair propulsion/positioning PT Transfers Anticipated Outcome(s): Min A PT Locomotion Anticipated Outcome(s): Min-Mod A PT Recommendation Follow Up Recommendations: Home health PT;24 hour supervision/assistance Patient destination: Home Equipment Recommended: Wheelchair cushion (measurements);Wheelchair (measurements);To be determined Equipment Details: Pt reports owning no assistive devices; will confirm with husband.  Skilled Therapeutic Intervention PT evaluation completed. See below for detailed findings. Pt received semi-reclined in bed with grandson present. Oriented x3 to self, location, and situation. Pt on 3L supplemental O2, [place]. Monitored/mantained SpO2 >93% and HR<105 bpm during activity. Session focused on increasing pt independence with functional transfers, improving postural control, static sitting/standing balance. Seated EOB: static sitting x7 minutes with LUE support requiring min-modA to prevent posterior trunk lean. Performed squat-pivot from bed>w/c (to L side) with max A, manual stabilization of R knee, and max verbal cueing for sequencing. Once seated in w/c, pt requires max A for repositioning to address posterior pelvic tilt. Sit>stand from w/c with  +1 totalA, manual facilitation of anterior weight shift, manual stabilization of RLE. Multiple rest breaks required secondary to pt fatigue. Session ended with pt seated in w/c with OT present for session.  PT Evaluation Precautions/Restrictions Precautions Precautions: Fall Precaution Comments: right hemiparesis, right inattention and visual field cut, monitor O2 sats Restrictions Weight Bearing Restrictions: No Home Living/Prior Functioning Home Living Available Help at Discharge: Family;Available 24 hours/day Type of Home: House Home Access: Stairs to enter [employer] of Steps: 1 Entrance Stairs-Rails: None Home Layout: Multi-level;1/2 bath on main level;Bed/bath upstairs;Other (Comment) Alternate Level Stairs-Number of Steps: 12 Alternate Level Stairs-Rails: Right Additional Comments: Full bathroom (2nd level) with shower. All bedrooms on second level of home.  Lives With: Spouse Prior Function  Able to Take Stairs?: Yes Driving: No Vocation: Retired Leisure: Hobbies-yes (Comment) Comments: shopping, sewing, crossword puzzles Vision/Perception  Vision - History Baseline Vision: Wears glasses all the time Patient Visual Report: Blurring of vision Vision - Assessment Eye Alignment: Impaired (comment) Vision Assessment: Vision not tested Perception Perception: Impaired Inattention/Neglect: Does not attend to right visual field;Does not attend to right side of body  Cognition Overall Cognitive Status: Impaired/Different from baseline Arousal/Alertness: Awake/alert Sensation Sensation Light Touch: Impaired by gross assessment Light Touch Impaired Details: Impaired RLE Proprioception: Impaired by gross assessment Proprioception Impaired Details: Impaired RLE Coordination Gross Motor Movements are Fluid and Coordinated: No Motor  Motor Motor: Hemiplegia;Abnormal postural alignment and control Motor - Skilled Clinical Observations: In seated/standing,  limited cervical/thoracic spine extension, R-sided trunk lean noted. Limited bilat hip extension in standing.  Mobility Bed Mobility Bed Mobility: Rolling Left;Supine to Sit Rolling Right: With rail Rolling Left: 3: Mod assist Rolling Left Details: Tactile cues for sequencing;Tactile cues for initiation;Tactile cues for placement;Verbal cues for sequencing Supine to Sit: 2: Max assist Supine to Sit: Patient Percentage: 40% Supine to Sit Details: Tactile cues for initiation;Tactile cues for sequencing;Verbal cues for sequencing;Tactile cues for placement Transfers Transfers: Yes Sit to Stand: 1: +1 Total assist Sit to Stand Details: Manual facilitation for weight shifting;Verbal cues for technique;Tactile cues for posture Stand to Sit: 1: +1 Total assist;To chair/3-in-1 Stand to Sit Details (indicate cue type and reason): Verbal cues for technique;Manual facilitation for weight shifting;Tactile  cues for placement Locomotion  Ambulation Ambulation: No Ambulation/Gait Assistance: Not tested (comment) (unsafe to attempt gait at this time as pt required +1Total A for standing, decreased postural control) Gait Gait: No (Unsafe at this time; see above.) Stairs / Additional Locomotion Stairs: No (Unsafe at this time; see above.) Wheelchair Mobility Wheelchair Mobility: No (Unable to attempt w/c mobility secondary to significant pt fatigue after bed mobility, transfer.)  Trunk/Postural Assessment  Cervical Assessment Cervical Assessment: Exceptions to [facility] Cervical Strength Overall Cervical Strength Comments: limited cervical spine extension in seated; tendency toward R cervical rotation in seated/standing Thoracic Assessment Thoracic Assessment: Exceptions to [facility] Thoracic Strength Overall Thoracic Strength: Deficits Overall Thoracic Strength Comments: Limited thoracic spine extension in seated without back support and in standing Postural Control Postural Control: Deficits on  evaluation Trunk Control: Pt sits in a posterior pelvic tilt with increased weightbearing on the right hip.  Balance Balance Balance Assessed: Yes Static Sitting Balance Static Sitting - Balance Support: Feet supported;Left upper extremity supported Static Sitting - Level of Assistance: 3: Mod assist Static Sitting - Comment/# of Minutes: Seated EOB with lateral trunk lean to R side; mod A to prevent posterior LOB. Dynamic Sitting Balance Dynamic Sitting - Balance Support: Left upper extremity supported;Feet supported Dynamic Sitting - Level of Assistance: 2: Max assist Static Standing Balance Static Standing - Balance Support: No upper extremity supported Static Standing - Level of Assistance: 1: +1 Total assist Static Standing - Comment/# of Minutes: Static standing x30 sec with +1Total A of therapist, manual stabilization of R knee. Verbal cueing for cervical/thoracic extension. Dynamic Standing Balance Dynamic Standing - Comments: Not attempted secondary to decreased postural stability in static standing. Extremity Assessment  RLE Assessment RLE Assessment: Exceptions to [facility] RLE Strength RLE Overall Strength: Deficits RLE Overall Strength Comments: Unable to formally assess strength secondary to pt-demonstrated receptive difficulties; observation of functional activities reveals grossly 0/5 in RLE with exception of trace hamstring, ankle plantarflexor strength. LLE Assessment LLE Assessment: Exceptions to [facility] LLE Strength LLE Overall Strength: Deficits LLE Overall Strength Comments: Unable to formally assess strength secondary to pt-demonstrated receptive difficulties; observation of functional strength reveals grossly 4/5 LLE strength in all joints, all planes, with exception of  3/5 hip extension/abduction.  FIM:  FIM - Locomotion: Ambulation Ambulation/Gait Assistance: Not tested (comment) (unsafe to attempt gait at this time as pt required +1Total A for standing, decreased  postural control)   Refer to Care Plan for Long Term Goals  Recommendations for other services: None  Discharge Criteria: Patient will be discharged from PT if patient refuses treatment 3 consecutive times without medical reason, if treatment goals not met, if there is a change in medical status, if patient makes no progress towards goals or if patient is discharged from hospital.  The above assessment, treatment plan, treatment alternatives and goals were discussed and mutually agreed upon: by patient and by family  Calvert Cantor 05/17/2013, 7:46 AM

## 2013-05-17 NOTE — Progress Notes (Addendum)
[facility] PROGRESS NOTE  [name] ZOX:[id number] DOB: [birth year] DOA: 05/04/2013 PCP: [name], MD  Brief History 79 y.o. right-handed female with history of hypertension, COPD, metastatic non-small cell lung cancer with ? Liver mets as well as ?right shoulder mets (large destructive lesion of glenoid) on chemotherapy followed by Dr. Arbutus Ped. Admitted 05/10/2013 after found down with right-sided weakness, right facial droop and abnormal speech. 05/10/13 MRI of the brain showed multiple acute subacute infarcts throughout the cerebellum bilaterally and slightly larger on the left. Supratentorial bilateral infarcts greater on the left with largest area of acute infarction involving the posterior left operculum region. MRA of the head with atherosclerotic type changes. Echocardiogram with ejection fraction of 60% without emboli. Carotid Dopplers with no ICA stenosis. Patient did not receive TPA. Neurology followed and patient on ASA for embolic infarcts due to hyper-coagulopathy.  Patient with elevated troponin due to NSTEMI with question of cardioembolic event. Patient not coumadin candidate and TEE would not alter treatment strategy.  This was also clarified by Cardiology whom saw pt last admission. UCS positive for Escherichia coli and patient started on Cipro.  The patient was in CIR when she was found unresponsive by the nursing staff. She was subsequently transferred to the acute side of the hospital for further evaluation.  05/01/2013 MRI revealed new infarcts in the inferior right and superior left cerebellum as well as the right occipital and left temporal area. There was also blood noted in the patient's mouth. Patient was started on intravenous Keppra. Neurology has followed the patient. Due to the patient's numerous comorbidities, positive medicine was consulted to see the patient. After discussion with the patient's family, the patient's focus of care was changed to full  comfort. Assessment/Plan: Acute cerebral infarction -likely embolic due to separate vascular territories -appreciate neurology input -spoke with palliative medicine team, Carlye Grippe has decided to make patient full comfort care -Focus on comfort care Acute encephalopathy -No longer an active issue as the patient's care has been changed to full comfort focus -likely due to acute stroke, but cannot rule out seizure -continue Keppra IV for now Atrial fibrillation -Currently rate controlled -No longer an active issue as the patient's care has been changed to full comfort focus Metastatic non-small cell lung carcinoma -as discussed, the patient's care now focused on full comfort Hx of recent NSTEMI -no further workup at this time COPD -stable -xopenex prn Chronic pain -IV morphine per palliative medicine  Family Communication:   Family at beside updated Disposition Plan:   Expect in hospital death        Procedures/Studies: Dg Chest 1 View  05/17/2013   CLINICAL DATA:  Stroke, lethargy  EXAM: CHEST - 1 VIEW  COMPARISON:  04/27/2013  FINDINGS: Right IJ Port-A-Cath to the low SVC. Right hilar masslike consolidation. Right lung volume loss with elevated hemidiaphragm and probable subpulmonic effusion. Heart size remains normal. Left lung clear. Regional bones unremarkable.  IMPRESSION: Stable right hilar masslike consolidation with probable effusion.   Electronically Signed   By: Oley Balm M.D.   On: 05/17/2013 08:10   Dg Chest 2 View  05/04/2013   CLINICAL DATA:  Follow-up of pleural effusion.  EXAM: CHEST  2 VIEW  COMPARISON:  May 14, 2013 chest x-ray  FINDINGS: There remains increased density posteriorly in the right hemi thorax not significantly changed from the earlier study. This may reflect a loculated pleural effusion or subpulmonic effusion as previously suggested. This has increased in conspicuity since the study of  May 10, 2013. There remain mildly  increased interstitial markings in the right lung which are unchanged. Similar findings are noted on the left. The left lung is better aerated than is the right. The cardiopericardial silhouette is not enlarged. The Port-A-Cath type appliance is unchanged in appearance. The observed portions of the bony thorax exhibit no acute abnormalities.  IMPRESSION: There is no significant change in the appearance of the right hemi thorax since the study of May 14, 2013. The soft tissue density posteriorly in the right lower hemi thorax has increased since May 10, 2013 and likely reflects a pleural effusion or possible subpulmonic effusion.   Electronically Signed   By: Breia Ocampo  [place]   On: 2013/05/22 16:02   Dg Chest 2 View  05/14/2013   CLINICAL DATA:  Possible apical pneumothorax on shoulder x-ray.  EXAM: CHEST  2 VIEW  COMPARISON:  Right shoulder radiographs 05/14/2013 and chest radiographs 04/2013.  FINDINGS: Right jugular Port-A-Cath remains in place with tip overlying the mid to lower SVC, unchanged. The right lung is diminished in volume. There is new opacity in the right lung base which may reflect a subpulmonic effusion. Increased density in the right hilum is stable to slightly increased from prior chest radiograph. No definite pneumothorax is identified. The left lung is well inflated and clear. Calcific supraspinatus tendinosis is again noted.  IMPRESSION: 1. No definite pneumothorax identified. 2. Increased opacity in the right lung base, which may reflect a subpulmonic effusion. 3. Stable to mildly increased right hilar opacity in the setting of treated lung cancer.   Electronically Signed   By: Sebastian Ache   On: 05/14/2013 17:45   Dg Chest 2 View  05/11/2013   CLINICAL DATA:  Stroke and right-sided weakness.  EXAM: CHEST  2 VIEW  COMPARISON:  Chest CT 02/15/2013  FINDINGS: Right IJ porta catheter in stable position. Right perihilar mass with surrounding interstitial changes in this patient with  treated lung cancer. No recent radiography for direct comparison. Small right pleural effusion which is chronic. Left lung is well-aerated.  IMPRESSION: 1. No active cardiopulmonary disease. 2. Chronic, small right pleural effusion. 3. Treated right lung cancer which is followed by CT.   Electronically Signed   By: Tiburcio Pea M.D.   On: 05/11/2013 05:31   Dg Shoulder Right  05/14/2013   CLINICAL DATA:  Pain.  Metastatic cancer.  EXAM: RIGHT SHOULDER - 2+ VIEW  COMPARISON:  Chest x-ray 05/10/2013.  FINDINGS: Port-A-Cath noted. Right medial lung field changes suggesting prior radiation therapy. A subtle right apical pneumothorax cannot be excluded.  Lucency is noted in the right scapula in the region of the upper bony glenoid. This is suspicious for lytic metastatic focus. Further evaluation with CT or MRI can be obtained. PET-CT can be obtained. No acute bony abnormality identified. There are degenerative changes present about the right shoulder. Evidence of calcific supraspinatus tendinitis noted.  IMPRESSION: 1. Possible lytic lesion in the right scapula in the region of the bony glenoid. Further evaluation with CT or MRI can be obtained. PET-CT can be obtained . 2. Post radiation therapy changes projected over the right mid lung field. 3. Port-A-Cath noted in good anatomic position. A subtle right apical pneumothorax cannot be excluded. These results were called by telephone at the time of interpretation on 05/14/2013 at 3:55 PM to Dr. Cyndie Mull , who verbally acknowledged these results.   Electronically Signed   By: Maisie Fus  Register   On: 05/14/2013 15:59   Ct  Head Wo Contrast  05/03/2013   CLINICAL DATA:  Right-sided weakness.  Found unresponsive.  EXAM: CT HEAD WITHOUT CONTRAST  TECHNIQUE: Contiguous axial images were obtained from the base of the skull through the vertex without intravenous contrast.  COMPARISON:  MRI brain 05/10/2013.  FINDINGS: Expected evolution of multiple subacute infarcts is  evident. This is most evident in the posterior left frontal and parietal lobe. Involving infarcts are present in the cerebellum bilaterally as well. There is no hemorrhage. No definite new infarct is present. Given the extent of previous infarcts, some progression cannot be excluded.  The ventricles are of normal size. No significant extra-axial fluid collection is present.  The paranasal sinuses and mastoid air cells are clear. The right maxillary sinus is shrunken, compatible with chronic disease. No active disease is evident.  IMPRESSION: 1. Expected evolution of multiple infarcts with most confluent area involving posterior left frontal lobe and parietal lobe. 2. No definite new infarct or hemorrhage.   Electronically Signed   By: Gennette Pac M.D.   On: 04/26/2013 20:03   Ct Head Wo Contrast  05/10/2013   CLINICAL DATA:  Weakness  EXAM: CT HEAD WITHOUT CONTRAST  CT CERVICAL SPINE WITHOUT CONTRAST  TECHNIQUE: Multidetector CT imaging of the head and cervical spine was performed following the standard protocol without intravenous contrast. Multiplanar CT image reconstructions of the cervical spine were also generated.  COMPARISON:  MRI 09/30/2011  FINDINGS: CT HEAD FINDINGS  Generalized atrophy. Chronic microvascular ischemic change in the white matter. Chronic infarct left cerebellum not present previously.  Negative for acute infarct, hemorrhage, or mass lesion. No edema or midline shift. Negative for skull lesion.  CT CERVICAL SPINE FINDINGS  Normal cervical alignment. Negative for fracture or mass lesion. Mild disc and mild facet degeneration.  IMPRESSION: Atrophy and chronic ischemia.  No acute abnormality.  Mild cervical degenerative change.  No acute bony abnormality.   Electronically Signed   By: Marlan Palau M.D.   On: 05/10/2013 09:04   Ct Cervical Spine Wo Contrast  05/10/2013   CLINICAL DATA:  Weakness  EXAM: CT HEAD WITHOUT CONTRAST  CT CERVICAL SPINE WITHOUT CONTRAST  TECHNIQUE:  Multidetector CT imaging of the head and cervical spine was performed following the standard protocol without intravenous contrast. Multiplanar CT image reconstructions of the cervical spine were also generated.  COMPARISON:  MRI 09/30/2011  FINDINGS: CT HEAD FINDINGS  Generalized atrophy. Chronic microvascular ischemic change in the white matter. Chronic infarct left cerebellum not present previously.  Negative for acute infarct, hemorrhage, or mass lesion. No edema or midline shift. Negative for skull lesion.  CT CERVICAL SPINE FINDINGS  Normal cervical alignment. Negative for fracture or mass lesion. Mild disc and mild facet degeneration.  IMPRESSION: Atrophy and chronic ischemia.  No acute abnormality.  Mild cervical degenerative change.  No acute bony abnormality.   Electronically Signed   By: Marlan Palau M.D.   On: 05/10/2013 09:04   Mr Maxine Glenn Head Wo Contrast  04/29/2013   CLINICAL DATA:  New onset mental status changes.  Unresponsive.  EXAM: MRI HEAD WITHOUT CONTRAST  MRA HEAD WITHOUT CONTRAST  TECHNIQUE: Multiplanar, multiecho pulse sequences of the brain and surrounding structures were obtained without intravenous contrast. Angiographic images of the head were obtained using MRA technique without contrast.  COMPARISON:  CT head from the same day.  MRI brain 05/10/2013.  FINDINGS: MRI HEAD FINDINGS  There is expected evolution of multiple prior infarcts. Multiple areas of new infarction are present including  the inferior right cerebellum and superior left cerebellum. There are at least 2 focal areas in the posterior left temporal lobe. The confluent left parietal area demonstrates some increase. There is scattered new areas in the right occipital lobe and right frontal operculum.  No hemorrhage or mass lesion is present. The ventricles are proportionate to the degree of atrophy. Underlying white matter disease is stable. More confluent FLAIR changes are associated with evolving infarcts, particularly in  the left frontal and parietal lobe.  Low is present in the major intracranial arteries. The globes and orbits are intact. The paranasal sinuses are clear. The right maxillary sinus is shrunken compatible with chronic disease.  MRA HEAD FINDINGS  The internal carotid arteries demonstrate similar irregularity in the cavernous carotid segments without significant stenosis. The right A1 segment is aplastic. The left A1 segment is normal. Marked attenuation of left MCA branch vessels is stable. More distal attenuation is present on the right.  The left vertebral artery is hypoplastic and terminates at the PICA. There is moderate irregularity and narrowing of the distal right vertebral artery and proximal basilar artery. The posterior cerebral arteries are of fetal type. There is moderate attenuation of distal branch vessels.  IMPRESSION: 1. Multiple new areas of acute infarction as detailed above. 2. Expected evolution of prior infarcts. 3. Moderate small vessel disease. 4. Multiple proximal stenoses within the left MCA branch vessels. 5. Atherosclerotic irregularity within the cavernous carotid arteries and distal right vertebral artery. 6. Overall stable appearance of the MRA.   Electronically Signed   By: Gennette Pac M.D.   On: 04/23/2013 21:30   Mr Maxine Glenn Head Wo Contrast  05/10/2013   CLINICAL DATA:  Weakness.  History of lung cancer.  EXAM: MRI HEAD WITHOUT CONTRAST  MRA HEAD WITHOUT CONTRAST  TECHNIQUE: Multiplanar, multiecho pulse sequences of the brain and surrounding structures were obtained without intravenous contrast. Angiographic images of the head were obtained using MRA technique without contrast.  COMPARISON:  05/10/2013 CT.  09/30/2011 brain MR.  FINDINGS: MRI HEAD FINDINGS  Multiple acute/subacute infarcts throughout the cerebellum bilaterally and slightly larger on the left. Supratentorial bilateral infarcts greater on the left with largest area of acute infarction involving the posterior left  operculum region. There is a parasagittal distribution of some of the infarcts involving the frontal-parietal lobe which may indicate a component of watershed infarction. However, with involvement of multiple vascular territories, embolus is of concern.  No intracranial hemorrhage.  Mild small vessel disease type changes.  Global atrophy without hydrocephalus.  No intracranial mass lesion noted on this unenhanced exam.  MRA HEAD FINDINGS  Ectatic distal vertical cervical segment of the left internal carotid artery. No significant stenosis of either internal carotid artery or carotid terminus.  Aplastic A1 segment right anterior cerebral artery.  No significant stenosis of the M1 segment of the middle cerebral artery on either side.  Decrease number of visualized middle cerebral artery branch vessels. The middle cerebral artery branches which are visualized are narrowed and irregular with narrowing more notable on the left.  Moderate stenosis portions of the A2 segment of the right anterior cerebral artery.  Fetal type contribution to the posterior circulation.  Left vertebral artery is small and ends in a posterior inferior cerebellar artery distribution. Mild to moderate narrowing of portions of the distal left vertebral artery and the left posterior inferior cerebellar artery.  Mild to moderate narrowing of the distal right vertebral artery and proximal basilar artery. Mild narrowing of portions of the  right posterior inferior cerebellar artery.  Non visualization of the anterior inferior cerebellar arteries.  Moderate narrowing superior cerebral artery bilaterally.  There is a bulge along the superior margin of the the basilar tip. This may reflect result of fetal type origin of the posterior cerebral arteries. Small aneurysm at this level is not excluded although felt less likely consideration.  Mild to moderate narrowing distal branches of the posterior cerebral artery bilaterally.  IMPRESSION: Multiple  acute/subacute infarcts throughout the cerebellum bilaterally and slightly larger on the left. Supratentorial bilateral infarcts greater on the left with largest area of acute infarction involving the posterior left operculum region. There is a parasagittal distribution of some of the infarcts involving the frontal-parietal lobe which may indicate a component of watershed infarction. However, with involvement of multiple vascular territories, embolus is of concern.  Intracranial atherosclerotic type changes as noted above most notable involving branch vessels and posterior circulation.  These results were called by telephone at the time of interpretation on 05/10/2013 at 2:27 PM to Dr. Linwood Dibbles , who verbally acknowledged these results.   Electronically Signed   By: Bridgett Larsson M.D.   On: 05/10/2013 14:37   Mr Brain Wo Contrast  05/05/2013   CLINICAL DATA:  New onset mental status changes.  Unresponsive.  EXAM: MRI HEAD WITHOUT CONTRAST  MRA HEAD WITHOUT CONTRAST  TECHNIQUE: Multiplanar, multiecho pulse sequences of the brain and surrounding structures were obtained without intravenous contrast. Angiographic images of the head were obtained using MRA technique without contrast.  COMPARISON:  CT head from the same day.  MRI brain 05/10/2013.  FINDINGS: MRI HEAD FINDINGS  There is expected evolution of multiple prior infarcts. Multiple areas of new infarction are present including the inferior right cerebellum and superior left cerebellum. There are at least 2 focal areas in the posterior left temporal lobe. The confluent left parietal area demonstrates some increase. There is scattered new areas in the right occipital lobe and right frontal operculum.  No hemorrhage or mass lesion is present. The ventricles are proportionate to the degree of atrophy. Underlying white matter disease is stable. More confluent FLAIR changes are associated with evolving infarcts, particularly in the left frontal and parietal lobe.   Low is present in the major intracranial arteries. The globes and orbits are intact. The paranasal sinuses are clear. The right maxillary sinus is shrunken compatible with chronic disease.  MRA HEAD FINDINGS  The internal carotid arteries demonstrate similar irregularity in the cavernous carotid segments without significant stenosis. The right A1 segment is aplastic. The left A1 segment is normal. Marked attenuation of left MCA branch vessels is stable. More distal attenuation is present on the right.  The left vertebral artery is hypoplastic and terminates at the PICA. There is moderate irregularity and narrowing of the distal right vertebral artery and proximal basilar artery. The posterior cerebral arteries are of fetal type. There is moderate attenuation of distal branch vessels.  IMPRESSION: 1. Multiple new areas of acute infarction as detailed above. 2. Expected evolution of prior infarcts. 3. Moderate small vessel disease. 4. Multiple proximal stenoses within the left MCA branch vessels. 5. Atherosclerotic irregularity within the cavernous carotid arteries and distal right vertebral artery. 6. Overall stable appearance of the MRA.   Electronically Signed   By: Gennette Pac M.D.   On: 05/06/2013 21:30   Mr Laqueta Jean ZO Contrast  05/10/2013   CLINICAL DATA:  Weakness.  History of lung cancer.  EXAM: MRI HEAD WITHOUT CONTRAST  MRA  HEAD WITHOUT CONTRAST  TECHNIQUE: Multiplanar, multiecho pulse sequences of the brain and surrounding structures were obtained without intravenous contrast. Angiographic images of the head were obtained using MRA technique without contrast.  COMPARISON:  05/10/2013 CT.  09/30/2011 brain MR.  FINDINGS: MRI HEAD FINDINGS  Multiple acute/subacute infarcts throughout the cerebellum bilaterally and slightly larger on the left. Supratentorial bilateral infarcts greater on the left with largest area of acute infarction involving the posterior left operculum region. There is a parasagittal  distribution of some of the infarcts involving the frontal-parietal lobe which may indicate a component of watershed infarction. However, with involvement of multiple vascular territories, embolus is of concern.  No intracranial hemorrhage.  Mild small vessel disease type changes.  Global atrophy without hydrocephalus.  No intracranial mass lesion noted on this unenhanced exam.  MRA HEAD FINDINGS  Ectatic distal vertical cervical segment of the left internal carotid artery. No significant stenosis of either internal carotid artery or carotid terminus.  Aplastic A1 segment right anterior cerebral artery.  No significant stenosis of the M1 segment of the middle cerebral artery on either side.  Decrease number of visualized middle cerebral artery branch vessels. The middle cerebral artery branches which are visualized are narrowed and irregular with narrowing more notable on the left.  Moderate stenosis portions of the A2 segment of the right anterior cerebral artery.  Fetal type contribution to the posterior circulation.  Left vertebral artery is small and ends in a posterior inferior cerebellar artery distribution. Mild to moderate narrowing of portions of the distal left vertebral artery and the left posterior inferior cerebellar artery.  Mild to moderate narrowing of the distal right vertebral artery and proximal basilar artery. Mild narrowing of portions of the right posterior inferior cerebellar artery.  Non visualization of the anterior inferior cerebellar arteries.  Moderate narrowing superior cerebral artery bilaterally.  There is a bulge along the superior margin of the the basilar tip. This may reflect result of fetal type origin of the posterior cerebral arteries. Small aneurysm at this level is not excluded although felt less likely consideration.  Mild to moderate narrowing distal branches of the posterior cerebral artery bilaterally.  IMPRESSION: Multiple acute/subacute infarcts throughout the cerebellum  bilaterally and slightly larger on the left. Supratentorial bilateral infarcts greater on the left with largest area of acute infarction involving the posterior left operculum region. There is a parasagittal distribution of some of the infarcts involving the frontal-parietal lobe which may indicate a component of watershed infarction. However, with involvement of multiple vascular territories, embolus is of concern.  Intracranial atherosclerotic type changes as noted above most notable involving branch vessels and posterior circulation.  These results were called by telephone at the time of interpretation on 05/10/2013 at 2:27 PM to Dr. Linwood Dibbles , who verbally acknowledged these results.   Electronically Signed   By: Bridgett Larsson M.D.   On: 05/10/2013 14:37         Subjective: Patient is encephalopathic. She is unable to verbalize. She mentions with tactile stimuli. No reports of respiratory distress, vomiting, diarrhea.  Objective: Filed Vitals:   05/17/13 0230 05/17/13 0422 05/17/13 0500 05/17/13 1029  BP: 103/47 92/66 86/54  112/63  Pulse: 104 112  99  Temp: 98 F (36.7 C) 97.3 F (36.3 C) 98.2 F (36.8 C) 98 F (36.7 C)  TempSrc: Oral Axillary Axillary Axillary  Resp: 17 18 18 20   Height:      Weight:      SpO2: 99% 99% 99% 94%  No intake or output data in the 24 hours ending 05/17/13 1056 Weight change:  Exam:   General:  Pt is somnolent, not in acute distress  HEENT: No icterus,   [place]/AT  Cardiovascular: IRRR no rubs  Respiratory: R- clear to auscultation; L- crackles and diminished BS  Abdomen: Soft/+BS, non tender, non distended, no guarding  Extremities: 1+ b/l LE edema, feet are cool and mottled.  No lymphangitis  Data Reviewed: Basic Metabolic Panel:  Recent Labs Lab 05/13/13 1150 05/14/13 0600 05/15/13 0545 05/01/2013 0545 05/17/13 0500  NA 140 141 140 142 140  K 3.6 3.3* 3.8 3.5 3.6  CL 106 108 107 106 104  CO2 23 24 25 25 26   GLUCOSE 106* 90 102*  96 94  BUN 21 19 18 19 18   CREATININE 0.69 0.72 0.82 0.88 0.73  CALCIUM 8.4 8.2* 8.2* 8.3* 8.3*   Liver Function Tests:  Recent Labs Lab 05/11/13 0348 05/17/13 0500  AST 57* 90*  ALT 51* 107*  ALKPHOS 169* 303*  BILITOT 1.5* 1.3*  PROT 5.9* 4.7*  ALBUMIN 2.6* 1.9*   No results found for this basename: LIPASE, AMYLASE,  in the last 168 hours No results found for this basename: AMMONIA,  in the last 168 hours CBC:  Recent Labs Lab 05/13/13 1150 05/14/13 0600 05/15/13 0545 05/17/2013 0545 05/17/13 0500  WBC 11.3* 8.6 9.2 8.0 8.3  NEUTROABS  --   --   --   --  7.1  HGB 11.4* 10.5* 9.9* 10.2* 9.7*  HCT 35.4* 32.1* 31.4* 31.1* 30.1*  MCV 88.7 88.7 89.7 87.6 88.5  PLT 75* 73* 58* 71* 61*   Cardiac Enzymes:  Recent Labs Lab 05/10/13 1524 05/10/13 2210 05/11/13 1412  TROPONINI 7.51* 7.55* 2.80*   BNP: No components found with this basename: POCBNP,  CBG:  Recent Labs Lab 05/07/2013 1941  GLUCAP 165*    Recent Results (from the past 240 hour(s))  URINE CULTURE     Status: None   Collection Time    05/10/13  9:11 AM      Result Value Range Status   Specimen Description URINE, CLEAN CATCH   Final   Special Requests NONE   Final   Culture  Setup Time     Final   Value: 05/10/2013 10:10     Performed at [facility] Count     Final   Value: >=100,000 COLONIES/ML     Performed at [facility]   Culture     Final   Value: ESCHERICHIA COLI     Performed at [facility]   Report Status 05/12/2013 FINAL   Final   Organism ID, Bacteria ESCHERICHIA COLI   Final     Scheduled Meds: . antiseptic oral rinse  15 mL Mouth Rinse BID  . levETIRAcetam  500 mg Intravenous Q12H   Continuous Infusions: . sodium chloride 50 mL/hr at 05/17/13 0214  . morphine       Mireya Meditz, DO  [facility] Pager [phone]  If 7PM-7AM, please contact night-coverage [email or web address] Password [email or web address] 05/17/2013, 10:56 AM   LOS: 1 day

## 2013-05-17 NOTE — Significant Event (Addendum)
Rapid Response Event Note  Overview: Time Called: 1942 Arrival Time: 1944 Event Type: Neurologic  Initial Focused Assessment: Responded to Code Stroke call at 1942.  Patient on inpatient rehab post recent CVA, right side weakness. Per RN at 1800 patient at baseline.  During change of shift RN heard patient coughing, walked in to check on patient, patient not engaging with staff, tearful then unresponsive. Upon my arrival patient lying in bed.  Grimaces to sternal rub, otherwise no response.   Patient with snoring respirations. BP 92/50  HR 105, RR 14, O2 sat 97% on 2L [place], temp 98   Interventions: Transported patient to CT for non contrast head CT Dr Roseanne Reno at bedside to assess patient,  No improvement in patient status. Family at bedside Transported to MRI,  Per Dr Roseanne Reno MRI (+) for new stroke. Plan readmit to 4N  Event Summary: Name of Physician Notified: Dr Wynn Banker at [phone]  Name of Consulting Physician Notified: Dr Roseanne Reno at 1942  Outcome: Transferred (Comment) [phone])  Event End Time: 2100  Marcellina Millin

## 2013-05-17 NOTE — Progress Notes (Signed)
Stroke Team Progress Note  HISTORY  [name] is an 79 y.o. 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 She was subsequently discharged and admitted to inpatient rehabilitation on 4 W. She was found unresponsive by nursing staff and was noted to have bleeding from the mouth as well as to rule out respirations. She was afebrile, slightly tachycardic with unremarkable blood pressure. She responded only minimally to sternal rub. She had less movement of her right extremities compared to left extremities. Stat CT scan of the head showed no acute changes, with expected evolutionary changes of her stroke from 6 days earlier. MRI showed multiple new areas of acute infarction involving the posterior left temporal region as well as scattered new areas of infarction involving the right occipital lobe and right operculum. MRA showed multiple proximal stenosis within left MCA branches. Atherosclerotic changes were noted. No large vessel occlusion was seen. Family members indicated patient was unresponsive at the onset of stroke 6 days ago. No frank seizure activity was reported. tPA was not given due to acute stroke 6 days ago.   SUBJECTIVE Her family is at the bedside.  her condition is unchanged. Patient is still in   OBJECTIVE Most recent Vital Signs: Filed Vitals:   05/17/13 0230 05/17/13 0422 05/17/13 0500 05/17/13 1029  BP: 103/47 92/66 86/54  112/63  Pulse: 104 112  99  Temp: 98 F (36.7 C) 97.3 F (36.3 C) 98.2 F (36.8 C) 98 F (36.7 C)  TempSrc: Oral Axillary Axillary Axillary  Resp: 17 18 18 20   Height:      Weight:      SpO2: 99% 99% 99% 94%   CBG (last 3)   Recent Labs  05/12/2013 1941  GLUCAP 165*    IV Fluid Intake:   . sodium chloride 20 mL/hr (05/17/13 1128)  . morphine 5 mg/hr (05/17/13 1135)    MEDICATIONS  . antiseptic oral rinse   15 mL Mouth Rinse BID  . levETIRAcetam  500 mg Intravenous Q12H   PRN:  morphine injection  Diet:  NPO  liquids Activity:  Bedrest DVT Prophylaxis:  SCD  CLINICALLY SIGNIFICANT STUDIES Basic Metabolic Panel:  Recent Labs Lab 05/13/2013 0545 05/17/13 0500  NA 142 140  K 3.5 3.6  CL 106 104  CO2 25 26  GLUCOSE 96 94  BUN 19 18  CREATININE 0.88 0.73  CALCIUM 8.3* 8.3*   Liver Function Tests:  Recent Labs Lab 05/11/13 0348 05/17/13 0500  AST 57* 90*  ALT 51* 107*  ALKPHOS 169* 303*  BILITOT 1.5* 1.3*  PROT 5.9* 4.7*  ALBUMIN 2.6* 1.9*   CBC:  Recent Labs Lab 04/26/2013 0545 05/17/13 0500  WBC 8.0 8.3  NEUTROABS  --  7.1  HGB 10.2* 9.7*  HCT 31.1* 30.1*  MCV 87.6 88.5  PLT 71* 61*   Coagulation: No results found for this basename: LABPROT, INR,  in the last 168 hours Cardiac Enzymes:  Recent Labs Lab 05/10/13 1524 05/10/13 2210 05/11/13 1412  TROPONINI 7.51* 7.55* 2.80*   Urinalysis: No results found for this basename: COLORURINE, APPERANCEUR, LABSPEC, PHURINE, GLUCOSEU, HGBUR, BILIRUBINUR, KETONESUR, PROTEINUR, UROBILINOGEN, NITRITE, LEUKOCYTESUR,  in the last 168 hours Lipid Panel    Component Value Date/Time   CHOL 193 05/11/2013 0348   TRIG 101 05/11/2013 0348   HDL 58 05/11/2013 0348   CHOLHDL 3.3 05/11/2013 0348   VLDL  20 05/11/2013 0348   LDLCALC 115* 05/11/2013 0348   HgbA1C  Lab Results  Component Value Date   HGBA1C 5.4 05/11/2013    Urine Drug Screen:     Component Value Date/Time   LABOPIA NONE DETECTED 05/10/2013 0911   COCAINSCRNUR NONE DETECTED 05/10/2013 0911   LABBENZ NONE DETECTED 05/10/2013 0911   AMPHETMU NONE DETECTED 05/10/2013 0911   THCU NONE DETECTED 05/10/2013 0911   LABBARB NONE DETECTED 05/10/2013 0911    Alcohol Level: No results found for this basename: ETH,  in the last 168 hours  Dg Chest 1 View  05/17/2013   CLINICAL DATA:  Stroke, lethargy  EXAM: CHEST - 1 VIEW  COMPARISON:  2013-05-22  FINDINGS: Right IJ  Port-A-Cath to the low SVC. Right hilar masslike consolidation. Right lung volume loss with elevated hemidiaphragm and probable subpulmonic effusion. Heart size remains normal. Left lung clear. Regional bones unremarkable.  IMPRESSION: Stable right hilar masslike consolidation with probable effusion.   Electronically Signed   By: Oley Balm M.D.   On: 05/17/2013 08:10   Dg Chest 2 View  05-22-13   CLINICAL DATA:  Follow-up of pleural effusion.  EXAM: CHEST  2 VIEW  COMPARISON:  May 14, 2013 chest x-ray  FINDINGS: There remains increased density posteriorly in the right hemi thorax not significantly changed from the earlier study. This may reflect a loculated pleural effusion or subpulmonic effusion as previously suggested. This has increased in conspicuity since the study of May 10, 2013. There remain mildly increased interstitial markings in the right lung which are unchanged. Similar findings are noted on the left. The left lung is better aerated than is the right. The cardiopericardial silhouette is not enlarged. The Port-A-Cath type appliance is unchanged in appearance. The observed portions of the bony thorax exhibit no acute abnormalities.  IMPRESSION: There is no significant change in the appearance of the right hemi thorax since the study of May 14, 2013. The soft tissue density posteriorly in the right lower hemi thorax has increased since May 10, 2013 and likely reflects a pleural effusion or possible subpulmonic effusion.   Electronically Signed   By: David  [place]   On: May 22, 2013 16:02   Ct Head Wo Contrast  May 22, 2013   CLINICAL DATA:  Right-sided weakness.  Found unresponsive.  EXAM: CT HEAD WITHOUT CONTRAST  TECHNIQUE: Contiguous axial images were obtained from the base of the skull through the vertex without intravenous contrast.  COMPARISON:  MRI brain 05/10/2013.  FINDINGS: Expected evolution of multiple subacute infarcts is evident. This is most evident in the  posterior left frontal and parietal lobe. Involving infarcts are present in the cerebellum bilaterally as well. There is no hemorrhage. No definite new infarct is present. Given the extent of previous infarcts, some progression cannot be excluded.  The ventricles are of normal size. No significant extra-axial fluid collection is present.  The paranasal sinuses and mastoid air cells are clear. The right maxillary sinus is shrunken, compatible with chronic disease. No active disease is evident.  IMPRESSION: 1. Expected evolution of multiple infarcts with most confluent area involving posterior left frontal lobe and parietal lobe. 2. No definite new infarct or hemorrhage.   Electronically Signed   By: Gennette Pac M.D.   On: 05-22-2013 20:03   Mr Maxine Glenn Head Wo Contrast  May 22, 2013   CLINICAL DATA:  New onset mental status changes.  Unresponsive.  EXAM: MRI HEAD WITHOUT CONTRAST  MRA HEAD WITHOUT CONTRAST  TECHNIQUE: Multiplanar, multiecho pulse sequences of  the brain and surrounding structures were obtained without intravenous contrast. Angiographic images of the head were obtained using MRA technique without contrast.  COMPARISON:  CT head from the same day.  MRI brain 05/10/2013.  FINDINGS: MRI HEAD FINDINGS  There is expected evolution of multiple prior infarcts. Multiple areas of new infarction are present including the inferior right cerebellum and superior left cerebellum. There are at least 2 focal areas in the posterior left temporal lobe. The confluent left parietal area demonstrates some increase. There is scattered new areas in the right occipital lobe and right frontal operculum.  No hemorrhage or mass lesion is present. The ventricles are proportionate to the degree of atrophy. Underlying white matter disease is stable. More confluent FLAIR changes are associated with evolving infarcts, particularly in the left frontal and parietal lobe.  Low is present in the major intracranial arteries. The globes  and orbits are intact. The paranasal sinuses are clear. The right maxillary sinus is shrunken compatible with chronic disease.  MRA HEAD FINDINGS  The internal carotid arteries demonstrate similar irregularity in the cavernous carotid segments without significant stenosis. The right A1 segment is aplastic. The left A1 segment is normal. Marked attenuation of left MCA branch vessels is stable. More distal attenuation is present on the right.  The left vertebral artery is hypoplastic and terminates at the PICA. There is moderate irregularity and narrowing of the distal right vertebral artery and proximal basilar artery. The posterior cerebral arteries are of fetal type. There is moderate attenuation of distal branch vessels.  IMPRESSION: 1. Multiple new areas of acute infarction as detailed above. 2. Expected evolution of prior infarcts. 3. Moderate small vessel disease. 4. Multiple proximal stenoses within the left MCA branch vessels. 5. Atherosclerotic irregularity within the cavernous carotid arteries and distal right vertebral artery. 6. Overall stable appearance of the MRA.   Electronically Signed   By: Gennette Pac M.D.   On: 05/07/2013 21:30   Mr Brain Wo Contrast  05/03/2013   CLINICAL DATA:  New onset mental status changes.  Unresponsive.  EXAM: MRI HEAD WITHOUT CONTRAST  MRA HEAD WITHOUT CONTRAST  TECHNIQUE: Multiplanar, multiecho pulse sequences of the brain and surrounding structures were obtained without intravenous contrast. Angiographic images of the head were obtained using MRA technique without contrast.  COMPARISON:  CT head from the same day.  MRI brain 05/10/2013.  FINDINGS: MRI HEAD FINDINGS  There is expected evolution of multiple prior infarcts. Multiple areas of new infarction are present including the inferior right cerebellum and superior left cerebellum. There are at least 2 focal areas in the posterior left temporal lobe. The confluent left parietal area demonstrates some increase.  There is scattered new areas in the right occipital lobe and right frontal operculum.  No hemorrhage or mass lesion is present. The ventricles are proportionate to the degree of atrophy. Underlying white matter disease is stable. More confluent FLAIR changes are associated with evolving infarcts, particularly in the left frontal and parietal lobe.  Low is present in the major intracranial arteries. The globes and orbits are intact. The paranasal sinuses are clear. The right maxillary sinus is shrunken compatible with chronic disease.  MRA HEAD FINDINGS  The internal carotid arteries demonstrate similar irregularity in the cavernous carotid segments without significant stenosis. The right A1 segment is aplastic. The left A1 segment is normal. Marked attenuation of left MCA branch vessels is stable. More distal attenuation is present on the right.  The left vertebral artery is hypoplastic and terminates  at the PICA. There is moderate irregularity and narrowing of the distal right vertebral artery and proximal basilar artery. The posterior cerebral arteries are of fetal type. There is moderate attenuation of distal branch vessels.  IMPRESSION: 1. Multiple new areas of acute infarction as detailed above. 2. Expected evolution of prior infarcts. 3. Moderate small vessel disease. 4. Multiple proximal stenoses within the left MCA branch vessels. 5. Atherosclerotic irregularity within the cavernous carotid arteries and distal right vertebral artery. 6. Overall stable appearance of the MRA.   Electronically Signed   By: Gennette Pac M.D.   On: 05/12/2013 21:30   Therapy Recommendations: ASA 325 mg daily.   Physical Exam:   Filed Vitals:   05/17/13 0230 05/17/13 0422 05/17/13 0500 05/17/13 1029  BP: 103/47 92/66 86/54  112/63  Pulse: 104 112  99  Temp: 98 F (36.7 C) 97.3 F (36.3 C) 98.2 F (36.8 C) 98 F (36.7 C)  TempSrc: Oral Axillary Axillary Axillary  Resp: 17 18 18 20   Height:      Weight:       SpO2: 99% 99% 99% 94%    General: patient is unresponsive.  HEENT: pupil has myosis bilaterally, but reactive to the light. no scleral icterus, No JVD or bruit Cardiac: S1/S2, RRR, No murmurs, gallops or rubs Pulm: has rales on the left middle and base posteriorly. No wheezing, rhonchi or rubs. Abd: Soft, nondistended,no organomegaly, BS present Ext: No edema. 2+DP/PT pulse bilaterally Musculoskeletal: patient has cold lower extremeties with purple colored right great toe. Pulse are not detectable on the right leg. Skin: No rashes.  Psych: Patient is not psychotic, no suicidal or hemocidal ideation.  Neurologic Examination: Patient was unresponsive to verbal and tactile stimulation. She was minimally responsive to sternal rub with grimacing. Respirations were normal and regular. Pupils were 2 mm each and equal and react minimally to light. Extraocular movements were intact to oculocephalic maneuvers. Face was symmetrical with no signs of focal weakness. Muscle tone was flaccid throughout. She moves her left arm upon the pain stimulation. Patient had no spontaneous movements and no abnormal posturing. Deep tendon reflexes were 1+ and symmetrical. Plantar responses were extensor bilaterally.  ASSESSMENT [name] is a 79 y.o. female presenting with unresponsiveness. tPA was not given due to recent stroke (6days ago). MRI showed multiple new areas of acute infarction involving the posterior left temporal region as well as scattered new areas of infarction involving the right occipital lobe and right operculum. On aspirin 325 mg orally every day prior to admission. Now on aspirin 325 mg orally every day for secondary stroke prevention. Patient with resultant unresponsiveness.   Given patient's complicated presentation, including end stage of lung cancer, recurrent stroke, and possible arterial thromboembolism in the right leg, palliative care would be the best choice. Family agreed  with palliative direction. She is DNR now.    CT of the brain:  1. Expected evolution of multiple infarcts with most confluent area involving posterior left frontal lobe and parietal lobe. 2. No definite new infarct or hemorrhage.       MRI/MRA: 1. Multiple new areas of acute infarction as detailed above. 2. Expected evolution of prior infarcts. 3. Moderate small vessel disease. 4. Multiple proximal stenoses within the left MCA branch vessels. 5. Atherosclerotic irregularity within the cavernous carotid arteries and distal right vertebral artery. 6. Overall stable appearance of the MRA.   2D Echocardiogram on 05/11/13:  Ef 55 to 60%.     Carotid Doppler:  05/11/13: Findings suggest 1-39% internal carotid artery stenosis bilaterally. Vertebral arteries are patent with antegrade flow. CXR: Stable right hilar masslike consolidation with probable effusion.  LDL 115  A1c 5.4   Hospital day # 1  TREATMENT/PLAN  Continue aspirin 325 mg orally every day for secondary stroke prevention.  No further work up needed  Consult to palliative care  Will sign off   Lorretta Harp, MD PGY3, Internal Medicine Teaching Service Pager: [phone]   I have personally obtained a history, examined the patient, evaluated imaging results, and formulated the assessment and plan of care. I agree with the above. Lesly Dukes

## 2013-05-17 NOTE — Consult Note (Signed)
Patient ZO:XWRUE CAYLAN Gray      DOB: [birth year]      AVW:[id number]     Consult Note from the Palliative Medicine Team at [facility]    Consult Requested by: [name]     PCP: Monique Baars, MD Reason for Consultation:Clarification of GOC and options     Phone [phone]  Assessment of patients Current state: Multiple co-morbidities, overall poor progniosis.  Family has made decision for full comfort today.  Consult is for review of medical treatment options, clarification of goals of care and end of life issues, disposition and options, and symptom recommendation.  This NP [name] reviewed medical records, received report from team, assessed the patient and then meet at the patient's bedside along with her husband and two daughters [name], and [name] and several grand children  to discuss diagnosis prognosis, GOC, EOL wishes disposition and options.   A detailed discussion was had today regarding advanced directives.  Concepts specific to code status, artifical feeding and hydration, continued IV antibiotics and rehospitalization was had.  The difference between a aggressive medical intervention path  and a palliative comfort care path for this patient at this time was had.  Values and goals of care important to patient and family were attempted to be elicited.  Concept of Hospice and Palliative Care were discussed  Natural trajectory and expectations at EOL were discussed.  Questions and concerns addressed.  Hard Choices booklet left for review. Family encouraged to call with questions or concerns.  PMT will continue to support holistically.    Goals of Care: 1.  Code Status: DNR/DNI-comfort is main focus of care   2. Scope of Treatment: 1. Vital Signs:daily  2. Respiratory/Oxygen:for comfort only 3. Nutritional Support/Tube Feeds:no artificail feeeding now or in the future 4. Antibiotics:none 5. Blood Products:none 6. MVH:QION 7. Review of Medications  to be discontinued:minimize for comfort 8. Labs:none 9. Telemetry:none 10. Consults:none   4. Disposition: Expect hospital death, if patient stabilizes family is interested in resoedential   3. Symptom Management:   1. Anxiety/Agitation: Ativan 1 mg every 4 hrs prn IV 2. Pain: Morphine gtt, 1 mg/hr with titration for comfort, see orders  4. Psychosocial:  Emotional support offered to family, they understand the limited prognosis and are focused on comfort and are grieving their loss.      Patient Documents Completed or Given: Document Given Completed  Advanced Directives Pkt    MOST    DNR    Gone from My Sight yes   Hard Choices yes     Brief HPI:79 y.o. right-handed female with history of hypertension, COPD, metastatic non-small cell lung cancer with Liver mets as well right shoulder mets (large destructive lesion of glenoid) on chemotherapy followed by [name]. Admitted 05/10/2013 after found down with right-sided weakness, right facial droop and abnormal speech. 05/10/13 MRI of the brain showed multiple acute subacute infarcts throughout the cerebellum bilaterally and slightly larger on the left. Supratentorial bilateral infarcts greater on the left with largest area of acute infarction involving the posterior left operculum region. MRA of the head with atherosclerotic type change. Patient did not receive TPA. Neurology followed and patient on ASA for embolic infarcts due to hyper-coagulopathy.  Patient with elevated troponin due to NSTEMI with question of cardioembolic event.   The patient was in CIR when she was found unresponsive by the nursing staff. She was subsequently transferred to the acute side of the hospital for further evaluation. 05/14/2013 MRI revealed new  infarcts in the inferior right and superior left cerebellum as well as the right occipital and left temporal area. There was also blood noted in the patient's mouth. Patient was started on intravenous Keppra.  Neurology has followed the patient. Due to the patient's numerous comorbidities, palliaitve medicine was consulted to see the patient. After discussion with the patient's family, the patient's focus of care was changed to full comfort.    ZOX:WRUEAV to illicit due to level of conscientiousness    PMH:  Past Medical History  Diagnosis Date  . Lung mass   . History of radiation therapy 7/31/212-03/08/2011    right upper lobe lung adenocarcinoma  . COPD (chronic obstructive pulmonary disease)   . Hypertension   . Hypercholesterolemia   . Osteoporosis   . History of chemotherapy     carboplatin/paclitaxel  . lung ca July 2012    , right upper lobe  . Liver metastases dx'd 06/16/12     PSH: Past Surgical History  Procedure Laterality Date  . Abdominal hysterectomy      s/p for fibroid tumor  . Portacath placement      right ij   . Back surgery     I have reviewed the FH and SH and  If appropriate update it with new information. Allergies  Allergen Reactions  . Aleve [Naproxen Sodium] Rash  . Zyban [Bupropion Hcl] Rash   Scheduled Meds: . antiseptic oral rinse  15 mL Mouth Rinse BID  . aspirin  300 mg Rectal Daily   Or  . aspirin  325 mg Oral Daily  . ciprofloxacin  200 mg Intravenous Q12H  . enoxaparin (LOVENOX) injection  40 mg Subcutaneous Q24H  . levETIRAcetam  500 mg Intravenous Q12H   Continuous Infusions: . sodium chloride 50 mL/hr at 05/17/13 0214   PRN Meds:.levalbuterol, morphine injection, senna-docusate    BP 112/63  Pulse 99  Temp(Src) 98 F (36.7 C) (Axillary)  Resp 20  Ht 5\' 4"  (1.626 m)  Wt 60.1 kg (132 lb 7.9 oz)  BMI 22.73 kg/m2  SpO2 94%   PPS:10 %  No intake or output data in the 24 hours ending 05/17/13 1038 Physical Exam:  General: unresponsive to gentle touch and verbal stimuli HEENT:  Dry buccal membranes Chest:   Decreased in bases, scattered coarse BS CVS: tachycardic rate 108 Abdomen:soft NT +BS Ext: BLE cool and with  signs of mottling Neuro: unresponsive to gentle touch and verbal stimuli  Labs: CBC    Component Value Date/Time   WBC 8.3 05/17/2013 0500   WBC 6.4 04/17/2013 1023   RBC 3.40* 05/17/2013 0500   RBC 4.40 04/17/2013 1023   HGB 9.7* 05/17/2013 0500   HGB 12.7 04/17/2013 1023   HCT 30.1* 05/17/2013 0500   HCT 39.3 04/17/2013 1023   PLT 61* 05/17/2013 0500   PLT 170 04/17/2013 1023   MCV 88.5 05/17/2013 0500   MCV 89.2 04/17/2013 1023   MCH 28.5 05/17/2013 0500   MCH 28.8 04/17/2013 1023   MCHC 32.2 05/17/2013 0500   MCHC 32.3 04/17/2013 1023   RDW 14.9 05/17/2013 0500   RDW 14.1 04/17/2013 1023   LYMPHSABS 0.4* 05/17/2013 0500   LYMPHSABS 0.3* 04/17/2013 1023   MONOABS 0.5 05/17/2013 0500   MONOABS 0.6 04/17/2013 1023   EOSABS 0.3 05/17/2013 0500   EOSABS 0.6* 04/17/2013 1023   BASOSABS 0.0 05/17/2013 0500   BASOSABS 0.0 04/17/2013 1023    BMET    Component Value Date/Time   NA 140  05/17/2013 0500   NA 142 04/17/2013 1023   NA 142 09/16/2011 0917   K 3.6 05/17/2013 0500   K 4.0 04/17/2013 1023   K 3.7 09/16/2011 0917   CL 104 05/17/2013 0500   CL 106 11/22/2012 1104   CL 106 09/16/2011 0917   CO2 26 05/17/2013 0500   CO2 27 04/17/2013 1023   CO2 29 09/16/2011 0917   GLUCOSE 94 05/17/2013 0500   GLUCOSE 115 04/17/2013 1023   GLUCOSE 120* 11/22/2012 1104   GLUCOSE 110 09/16/2011 0917   BUN 18 05/17/2013 0500   BUN 13.3 04/17/2013 1023   BUN 16 09/16/2011 0917   CREATININE 0.73 05/17/2013 0500   CREATININE 1.0 04/17/2013 1023   CREATININE 0.8 09/16/2011 0917   CALCIUM 8.3* 05/17/2013 0500   CALCIUM 9.5 04/17/2013 1023   CALCIUM 8.3 09/16/2011 0917   GFRNONAA 79* 05/17/2013 0500   GFRAA >90 05/17/2013 0500    CMP     Component Value Date/Time   NA 140 05/17/2013 0500   NA 142 04/17/2013 1023   NA 142 09/16/2011 0917   K 3.6 05/17/2013 0500   K 4.0 04/17/2013 1023   K 3.7 09/16/2011 0917   CL 104 05/17/2013 0500   CL 106 11/22/2012 1104   CL 106 09/16/2011 0917    CO2 26 05/17/2013 0500   CO2 27 04/17/2013 1023   CO2 29 09/16/2011 0917   GLUCOSE 94 05/17/2013 0500   GLUCOSE 115 04/17/2013 1023   GLUCOSE 120* 11/22/2012 1104   GLUCOSE 110 09/16/2011 0917   BUN 18 05/17/2013 0500   BUN 13.3 04/17/2013 1023   BUN 16 09/16/2011 0917   CREATININE 0.73 05/17/2013 0500   CREATININE 1.0 04/17/2013 1023   CREATININE 0.8 09/16/2011 0917   CALCIUM 8.3* 05/17/2013 0500   CALCIUM 9.5 04/17/2013 1023   CALCIUM 8.3 09/16/2011 0917   PROT 4.7* 05/17/2013 0500   PROT 6.4 04/17/2013 1023   PROT 6.8 09/16/2011 0917   ALBUMIN 1.9* 05/17/2013 0500   ALBUMIN 3.1* 04/17/2013 1023   AST 90* 05/17/2013 0500   AST 20 04/17/2013 1023   AST 18 09/16/2011 0917   ALT 107* 05/17/2013 0500   ALT 16 04/17/2013 1023   ALT 18 09/16/2011 0917   ALKPHOS 303* 05/17/2013 0500   ALKPHOS 92 04/17/2013 1023   ALKPHOS 62 09/16/2011 0917   BILITOT 1.3* 05/17/2013 0500   BILITOT 1.29* 04/17/2013 1023   BILITOT 0.60 09/16/2011 0917   GFRNONAA 79* 05/17/2013 0500   GFRAA >90 05/17/2013 0500     CT scan of the Head Reviewed/Impressions:  IMPRESSION:  1. Expected evolution of multiple infarcts with most confluent area  involving posterior left frontal lobe and parietal lobe.  2. No definite new infarct or hemorrhage.   Time In Time Out Total Time Spent with Patient Total Overall Time  1035 1200 85 min 95 min    Greater than 50%  of this time was spent counseling and coordinating care related to the above assessment and plan.  Discussed with Monique Arbutus Leas  Monique Creed NP  Palliative Medicine Team Team Phone # [phone] Pager [phone]

## 2013-05-17 NOTE — Progress Notes (Signed)
Patient ID: [name], female   DOB: October 07, 1931, 79 y.o.   MRN: [id number]  CSW met with pt and her husband 04/30/2013 to introduce self and role of CSW on CIR and to complete initial assessment.  CSW also gave pt and husband update on team conference held 05/11/2013.  Questions were answered and support given.  Pt's change in condition and her transfer to acute was made known to CSW as documentation was being entered.  CSW now notes family's decision for comfort care after meeting with palliative care team.  This CSW will touch base with CSW on 4N and offer any assistance as needed.

## 2013-05-17 NOTE — H&P (Signed)
[facility] History and Physical  [name]:[id number] DOB: 10-08-1931 DOA: 05/02/2013  Referring physician: Dr. Roseanne Reno. Neurologist. PCP: [name], MD  Specialists: Dr. Arbutus Ped. Oncologist.  Chief Complaint: Patient not responding.  HPI: [name] is a 79 y.o. female who was recently admitted for CVA and was transferred to rehabilitation was found to be suddenly unresponsive around evening and a code stroke was called and CT head did not show anything acute but MRI brain shows new stroke in the cerebellar regions. In addition as per the neurology notes patient also had blood in her mouth. For which patient has been started on Keppra for possible seizure and postictal state. On my exam patient still minimally responsive to tactile stimuli. Patient otherwise as per the family did not complain of any chest pain or shortness of breath nausea vomiting abdominal pain or diarrhea. Earlier yesterday patient had gone to [place] [facility] to discuss with radiation therapist for her right shoulder metastatic cancer.   Review of Systems: As presented in the history of presenting illness, rest negative.  Past Medical History  Diagnosis Date  . Lung mass   . History of radiation therapy 7/31/212-03/08/2011    right upper lobe lung adenocarcinoma  . COPD (chronic obstructive pulmonary disease)   . Hypertension   . Hypercholesterolemia   . Osteoporosis   . History of chemotherapy     carboplatin/paclitaxel  . lung ca July 2012    , right upper lobe  . Liver metastases dx'd 06/16/12   Past Surgical History  Procedure Laterality Date  . Abdominal hysterectomy      s/p for fibroid tumor  . Portacath placement      right ij   . Back surgery     Social History:  reports that she quit smoking about 6 years ago. Her smoking use included Cigarettes. She has a 50 pack-year smoking history. She does not have any smokeless tobacco history on file. She reports that she drinks  alcohol. She reports that she does not use illicit drugs. Where does patient live presently in rehabilitation. Can patient participate in ADLs? No.  Allergies  Allergen Reactions  . Aleve [Naproxen Sodium] Rash  . Zyban [Bupropion Hcl] Rash    Family History:  Family History  Problem Relation Age of Onset  . Breast cancer Mother   . Breast cancer Sister     2 sisters   . Ovarian cancer Maternal Aunt   . Breast cancer Maternal Grandmother       Prior to Admission medications   Medication Sig Start Date End Date Taking? Authorizing Provider  acetaminophen (TYLENOL) 325 MG tablet Take 2 tablets (650 mg total) by mouth every 4 (four) hours as needed for mild pain (or temp >/= 99.5 F). 05/14/13  Yes [name], MD  aspirin 325 MG tablet Take 1 tablet (325 mg total) by mouth daily. 05/14/13  Yes [name], MD  atorvastatin (LIPITOR) 10 MG tablet Take 1 tablet (10 mg total) by mouth daily at 6 PM. 05/14/13  Yes W Buren Kos, MD  ciprofloxacin (CIPRO) 500 MG tablet Take 1 tablet (500 mg total) by mouth 2 (two) times daily. 05/14/13 05/14/2013 Yes W Buren Kos, MD  diphenoxylate-atropine (LOMOTIL) 2.5-0.025 MG per tablet Take 1 tablet by mouth 4 (four) times daily as needed for diarrhea or loose stools.   Yes Historical Provider, MD  feeding supplement, ENSURE COMPLETE, (ENSURE COMPLETE) LIQD Take 237 mLs by mouth 2 (two) times daily between  meals. 05/14/13  Yes W Buren Kos, MD  lidocaine-prilocaine (EMLA) cream Apply topically as needed. Use as directed 05/09/12  Yes Adrena E Johnson, PA-C  morphine (MS CONTIN) 15 MG 12 hr tablet Take 1 tablet (15 mg total) by mouth every 12 (twelve) hours. 05/14/13  Yes W Buren Kos, MD  oxyCODONE-acetaminophen (PERCOCET/ROXICET) 5-325 MG per tablet Take 1 tablet by mouth every 4 (four) hours as needed for severe pain. 05/14/13  Yes [name], MD  prochlorperazine (COMPAZINE) 10 MG tablet Take 1 tablet (10 mg total) by mouth every 6 (six)  hours as needed. 07/11/12  Yes Si Gaul, MD  TARCEVA 100 MG tablet TAKE 1 TABLET BY MOUTH DAILY. TAKE ON AN EMPTY STOMACH 1 HOUR BEFORE MEALS OR 2 HOURS AFTER 05/04/13  Yes Si Gaul, MD  traMADol (ULTRAM) 50 MG tablet Take 1 tablet (50 mg total) by mouth every 6 (six) hours as needed. Maximum dose= 8 tablets per day 03/07/12  Yes Conni Slipper, PA-C    Physical Exam: Filed Vitals:   06-03-13 2205 05/17/13 0000  BP: 92/50 102/59  Pulse: 105 108  Temp: 98 F (36.7 C) 97.8 F (36.6 C)  TempSrc: Oral Axillary  Resp: 14   Weight: 60.1 kg (132 lb 7.9 oz)   SpO2: 99% 98%     General:  Well-developed and poorly nourished.  Eyes: Anicteric. Pinpoint pupils.  ENT: No discharge.  Neck: No mass felt. No neck rigidity.  Cardiovascular: S1-S2 heard.  Respiratory: No rhonchi or crepitations.  Abdomen: Soft nontender bowel sounds present.  Skin: No rash.  Musculoskeletal: No edema.  Psychiatric: Patient is minimally responsive to tactile stimuli.  Neurologic: Patient is minimally responsive to tactile stimuli.  Labs on Admission:  Basic Metabolic Panel:  Recent Labs Lab 05/10/13 0858 05/10/13 0908 05/13/13 1150 05/14/13 0600 05/15/13 0545 06-03-13 0545  NA 139 138 140 141 140 142  K 3.5 3.5 3.6 3.3* 3.8 3.5  CL 100 102 106 108 107 106  CO2 25  --  23 24 25 25   GLUCOSE 119* 118* 106* 90 102* 96  BUN 24* 24* 21 19 18 19   CREATININE 0.96 1.20* 0.69 0.72 0.82 0.88  CALCIUM 9.1  --  8.4 8.2* 8.2* 8.3*   Liver Function Tests:  Recent Labs Lab 05/10/13 0858 05/11/13 0348  AST 60* 57*  ALT 57* 51*  ALKPHOS 177* 169*  BILITOT 1.4* 1.5*  PROT 6.3 5.9*  ALBUMIN 2.8* 2.6*   No results found for this basename: LIPASE, AMYLASE,  in the last 168 hours No results found for this basename: AMMONIA,  in the last 168 hours CBC:  Recent Labs Lab 05/10/13 0858 05/10/13 0908 05/13/13 1150 05/14/13 0600 05/15/13 0545 06/03/2013 0545  WBC 11.5*  --  11.3*  8.6 9.2 8.0  NEUTROABS 10.5*  --   --   --   --   --   HGB 12.6 13.3 11.4* 10.5* 9.9* 10.2*  HCT 38.3 39.0 35.4* 32.1* 31.4* 31.1*  MCV 87.4  --  88.7 88.7 89.7 87.6  PLT 73*  --  75* 73* 58* 71*   Cardiac Enzymes:  Recent Labs Lab 05/10/13 0858 05/10/13 1524 05/10/13 2210 05/11/13 1412  CKTOTAL 155  --   --   --   TROPONINI 1.84* 7.51* 7.55* 2.80*    BNP (last 3 results) No results found for this basename: PROBNP,  in the last 8760 hours CBG:  Recent Labs Lab 05/10/13 1022 03-Jun-2013 1941  GLUCAP 117*  165*    Radiological Exams on Admission: Dg Chest 2 View  05/08/2013   CLINICAL DATA:  Follow-up of pleural effusion.  EXAM: CHEST  2 VIEW  COMPARISON:  May 14, 2013 chest x-ray  FINDINGS: There remains increased density posteriorly in the right hemi thorax not significantly changed from the earlier study. This may reflect a loculated pleural effusion or subpulmonic effusion as previously suggested. This has increased in conspicuity since the study of May 10, 2013. There remain mildly increased interstitial markings in the right lung which are unchanged. Similar findings are noted on the left. The left lung is better aerated than is the right. The cardiopericardial silhouette is not enlarged. The Port-A-Cath type appliance is unchanged in appearance. The observed portions of the bony thorax exhibit no acute abnormalities.  IMPRESSION: There is no significant change in the appearance of the right hemi thorax since the study of May 14, 2013. The soft tissue density posteriorly in the right lower hemi thorax has increased since May 10, 2013 and likely reflects a pleural effusion or possible subpulmonic effusion.   Electronically Signed   By: David  [place]   On: 04/21/2013 16:02   Ct Head Wo Contrast  05/05/2013   CLINICAL DATA:  Right-sided weakness.  Found unresponsive.  EXAM: CT HEAD WITHOUT CONTRAST  TECHNIQUE: Contiguous axial images were obtained from the base  of the skull through the vertex without intravenous contrast.  COMPARISON:  MRI brain 05/10/2013.  FINDINGS: Expected evolution of multiple subacute infarcts is evident. This is most evident in the posterior left frontal and parietal lobe. Involving infarcts are present in the cerebellum bilaterally as well. There is no hemorrhage. No definite new infarct is present. Given the extent of previous infarcts, some progression cannot be excluded.  The ventricles are of normal size. No significant extra-axial fluid collection is present.  The paranasal sinuses and mastoid air cells are clear. The right maxillary sinus is shrunken, compatible with chronic disease. No active disease is evident.  IMPRESSION: 1. Expected evolution of multiple infarcts with most confluent area involving posterior left frontal lobe and parietal lobe. 2. No definite new infarct or hemorrhage.   Electronically Signed   By: Gennette Pac M.D.   On: 05/15/2013 20:03   Mr Maxine Glenn Head Wo Contrast  05/15/2013   CLINICAL DATA:  New onset mental status changes.  Unresponsive.  EXAM: MRI HEAD WITHOUT CONTRAST  MRA HEAD WITHOUT CONTRAST  TECHNIQUE: Multiplanar, multiecho pulse sequences of the brain and surrounding structures were obtained without intravenous contrast. Angiographic images of the head were obtained using MRA technique without contrast.  COMPARISON:  CT head from the same day.  MRI brain 05/10/2013.  FINDINGS: MRI HEAD FINDINGS  There is expected evolution of multiple prior infarcts. Multiple areas of new infarction are present including the inferior right cerebellum and superior left cerebellum. There are at least 2 focal areas in the posterior left temporal lobe. The confluent left parietal area demonstrates some increase. There is scattered new areas in the right occipital lobe and right frontal operculum.  No hemorrhage or mass lesion is present. The ventricles are proportionate to the degree of atrophy. Underlying white matter  disease is stable. More confluent FLAIR changes are associated with evolving infarcts, particularly in the left frontal and parietal lobe.  Low is present in the major intracranial arteries. The globes and orbits are intact. The paranasal sinuses are clear. The right maxillary sinus is shrunken compatible with chronic disease.  MRA HEAD FINDINGS  The internal carotid arteries demonstrate similar irregularity in the cavernous carotid segments without significant stenosis. The right A1 segment is aplastic. The left A1 segment is normal. Marked attenuation of left MCA branch vessels is stable. More distal attenuation is present on the right.  The left vertebral artery is hypoplastic and terminates at the PICA. There is moderate irregularity and narrowing of the distal right vertebral artery and proximal basilar artery. The posterior cerebral arteries are of fetal type. There is moderate attenuation of distal branch vessels.  IMPRESSION: 1. Multiple new areas of acute infarction as detailed above. 2. Expected evolution of prior infarcts. 3. Moderate small vessel disease. 4. Multiple proximal stenoses within the left MCA branch vessels. 5. Atherosclerotic irregularity within the cavernous carotid arteries and distal right vertebral artery. 6. Overall stable appearance of the MRA.   Electronically Signed   By: Gennette Pac M.D.   On: 05/09/2013 21:30   Mr Brain Wo Contrast  05/01/2013   CLINICAL DATA:  New onset mental status changes.  Unresponsive.  EXAM: MRI HEAD WITHOUT CONTRAST  MRA HEAD WITHOUT CONTRAST  TECHNIQUE: Multiplanar, multiecho pulse sequences of the brain and surrounding structures were obtained without intravenous contrast. Angiographic images of the head were obtained using MRA technique without contrast.  COMPARISON:  CT head from the same day.  MRI brain 05/10/2013.  FINDINGS: MRI HEAD FINDINGS  There is expected evolution of multiple prior infarcts. Multiple areas of new infarction are present  including the inferior right cerebellum and superior left cerebellum. There are at least 2 focal areas in the posterior left temporal lobe. The confluent left parietal area demonstrates some increase. There is scattered new areas in the right occipital lobe and right frontal operculum.  No hemorrhage or mass lesion is present. The ventricles are proportionate to the degree of atrophy. Underlying white matter disease is stable. More confluent FLAIR changes are associated with evolving infarcts, particularly in the left frontal and parietal lobe.  Low is present in the major intracranial arteries. The globes and orbits are intact. The paranasal sinuses are clear. The right maxillary sinus is shrunken compatible with chronic disease.  MRA HEAD FINDINGS  The internal carotid arteries demonstrate similar irregularity in the cavernous carotid segments without significant stenosis. The right A1 segment is aplastic. The left A1 segment is normal. Marked attenuation of left MCA branch vessels is stable. More distal attenuation is present on the right.  The left vertebral artery is hypoplastic and terminates at the PICA. There is moderate irregularity and narrowing of the distal right vertebral artery and proximal basilar artery. The posterior cerebral arteries are of fetal type. There is moderate attenuation of distal branch vessels.  IMPRESSION: 1. Multiple new areas of acute infarction as detailed above. 2. Expected evolution of prior infarcts. 3. Moderate small vessel disease. 4. Multiple proximal stenoses within the left MCA branch vessels. 5. Atherosclerotic irregularity within the cavernous carotid arteries and distal right vertebral artery. 6. Overall stable appearance of the MRA.   Electronically Signed   By: Gennette Pac M.D.   On: 05/17/2013 21:30    EKG: Independently reviewed. Sinus rhythm.  Assessment/Plan Principal Problem:   CVA (cerebrovascular accident) Active Problems:   Lung cancer   COPD  (chronic obstructive pulmonary disease)   Hypertension   CVA (cerebral infarction)   Acute encephalopathy   1. CVA - at this time patient will be closely monitored in telemetry. Continue aspirin. Further recommendations per neurologist. 2. Acute encephalopathy - most likely secondary to #  1 and also possible seizures for which Keppra has been started and also EEG ordered. 3. History of atrial fibrillation - presently in sinus rhythm and patient was felt not a candidate for Coumadin. 4. COPD - presently not wheezing. 5. Metastatic lung cancer - per oncologist. 6. Chronic pain - patient has been placed on when necessary IV morphine.  Have consulted palliative team for symptom management and goals of care.    Code Status: DO NOT RESUSCITATE.  Family Communication: Patient's husband and daughters.  Disposition Plan: Admit to inpatient.    Uzoma Vivona N. [facility] Pager [phone].  If 7PM-7AM, please contact night-coverage [email or web address] Password [email or web address] 05/17/2013, 1:04 AM

## 2013-05-17 NOTE — Progress Notes (Signed)
79yo female was admitted 11/20 w/ CVA and UTI, had progressed well until sudden change in assessment at 20 and another code stroke was called with new areas of acute infarction on MRI, tx'd to 4N and back on NPO until swallow screen, to change Cipro to IV.  Will start Cipro 200mg  IV Q12H for CrCl ~45 ml/min and monitor.  Vernard Gambles, PharmD, BCPS 05/17/2013 1:30 AM

## 2013-05-18 ENCOUNTER — Inpatient Hospital Stay (HOSPITAL_COMMUNITY): Payer: Medicare Other | Admitting: Physical Therapy

## 2013-05-18 ENCOUNTER — Other Ambulatory Visit (HOSPITAL_COMMUNITY): Payer: Medicare Other

## 2013-05-18 ENCOUNTER — Encounter (HOSPITAL_COMMUNITY): Payer: Medicare Other | Admitting: Occupational Therapy

## 2013-05-18 ENCOUNTER — Encounter: Payer: Self-pay | Admitting: Radiation Oncology

## 2013-05-18 ENCOUNTER — Inpatient Hospital Stay (HOSPITAL_COMMUNITY): Payer: Medicare Other | Admitting: *Deleted

## 2013-05-18 ENCOUNTER — Inpatient Hospital Stay (HOSPITAL_COMMUNITY): Payer: Medicare Other | Admitting: Speech Pathology

## 2013-05-18 ENCOUNTER — Other Ambulatory Visit: Payer: Self-pay | Admitting: *Deleted

## 2013-05-18 ENCOUNTER — Ambulatory Visit (HOSPITAL_COMMUNITY): Payer: Medicare Other

## 2013-05-18 DIAGNOSIS — C3490 Malignant neoplasm of unspecified part of unspecified bronchus or lung: Secondary | ICD-10-CM

## 2013-05-18 DIAGNOSIS — G9340 Encephalopathy, unspecified: Secondary | ICD-10-CM

## 2013-05-18 DIAGNOSIS — C7952 Secondary malignant neoplasm of bone marrow: Secondary | ICD-10-CM

## 2013-05-18 DIAGNOSIS — I6350 Cerebral infarction due to unspecified occlusion or stenosis of unspecified cerebral artery: Secondary | ICD-10-CM

## 2013-05-18 DIAGNOSIS — C7951 Secondary malignant neoplasm of bone: Secondary | ICD-10-CM

## 2013-05-19 NOTE — IPOC Note (Signed)
Overall Plan of Care [facility]) Patient Details Name: [name] MRN: [id number] DOB: [birth year]  Admitting Diagnosis: L CVA  Hospital Problems: Active Problems:   CVA (cerebral infarction)     Functional Problem List: Nursing Behavior;Bladder;Bowel;Endurance;Medication Management;Motor;Nutrition;Perception;Safety;Skin Integrity  PT Balance;Edema;Endurance;Motor;Pain;Sensory;Other (comment) (Attention, Awareness)  OT Balance;Vision;Cognition;Endurance;Motor;Pain;Perception;Safety;Sensory  SLP    TR         Basic ADL's: OT Eating;Grooming;Bathing;Dressing;Toileting     Advanced  ADL's: OT       Transfers: PT Bed Mobility;Bed to Chair;Car;Furniture  OT Toilet     Locomotion: PT Ambulation;Wheelchair Mobility;Stairs     Additional Impairments: OT Fuctional Use of Upper Extremity  SLP        TR      Anticipated Outcomes Item Anticipated Outcome  Self Feeding supervision  Swallowing      Basic self-care  min assist  Toileting  min assist level   Bathroom Transfers min assist level  Bowel/Bladder  Manage bowel and bladder with mod assist  Transfers  Min A  Locomotion  Min-Mod A  Communication     Cognition     Pain  No c/o pain  Safety/Judgment  Mod assist    Therapy Plan: PT Intensity: Minimum of 1-2 x/day ,45 to 90 minutes PT Frequency: 5 out of 7 days PT Duration Estimated Length of Stay: 26-28 days OT Intensity: Minimum of 1-2 x/day, 45 to 90 minutes OT Frequency: 5 out of 7 days OT Duration/Estimated Length of Stay: 26-28 days         Team Interventions: Nursing Interventions Patient/Family Education;Bladder Management;Bowel Management;Disease Management/Prevention;Pain Management;Psychosocial Support;Medication Management  PT interventions Ambulation/gait training;Balance/vestibular training;Cognitive remediation/compensation;Discharge planning;Disease management/prevention;DME/adaptive equipment instruction;Functional mobility  training;Neuromuscular re-education;Pain management;Patient/family education;Stair training;Therapeutic Activities;Therapeutic Exercise;UE/LE Coordination activities;UE/LE Strength taining/ROM;Visual/perceptual remediation/compensation;Wheelchair propulsion/positioning  OT Interventions Balance/vestibular training;DME/adaptive equipment instruction;Self Care/advanced ADL retraining;Neuromuscular re-education;Patient/family education;Discharge planning;Community reintegration;Functional electrical stimulation;Functional mobility training;Therapeutic Activities;UE/LE Coordination activities;UE/LE Strength taining/ROM;Therapeutic Exercise  SLP Interventions    TR Interventions    SW/CM Interventions      Team Discharge Planning: Destination: PT-Home ,OT- Home , SLP-  Projected Follow-up: PT-Home health PT;24 hour supervision/assistance, OT-  Home health OT, SLP-  Projected Equipment Needs: PT-Wheelchair cushion (measurements);Wheelchair (measurements);To be determined, OT- 3 in 1 bedside comode;Wheelchair (measurements);Wheelchair cushion (measurements), SLP-  Equipment Details: PT-Pt reports owning no assistive devices; will confirm with husband., OT-  Patient/family involved in discharge planning: PT- Patient;Family member/caregiver,  OT-Patient;Family member/caregiver, SLP-   MD ELOS: NA Medical Rehab Prognosis:  Guarded Assessment: Pt transferred off rehab  Unit for recurrent CVA and expired    See Team Conference Notes for weekly updates to the plan of care

## 2013-05-21 NOTE — Discharge Summary (Signed)
Physician Discharge Summary  Patient ID: [name] MRN: [id number] DOB/AGE: 1932-05-18 79 y.o.  Admit date: 05/15/2013 Discharge date: 04/26/2013  Discharge Diagnoses:  Active Problems:   CVA (cerebral infarction)   Discharged Condition:  Guarded.   Significant Diagnostic Studies:  Dg Chest 2 View  04/29/2013   CLINICAL DATA:  Follow-up of pleural effusion.  EXAM: CHEST  2 VIEW  COMPARISON:  May 14, 2013 chest x-ray  FINDINGS: There remains increased density posteriorly in the right hemi thorax not significantly changed from the earlier study. This may reflect a loculated pleural effusion or subpulmonic effusion as previously suggested. This has increased in conspicuity since the study of May 10, 2013. There remain mildly increased interstitial markings in the right lung which are unchanged. Similar findings are noted on the left. The left lung is better aerated than is the right. The cardiopericardial silhouette is not enlarged. The Port-A-Cath type appliance is unchanged in appearance. The observed portions of the bony thorax exhibit no acute abnormalities.  IMPRESSION: There is no significant change in the appearance of the right hemi thorax since the study of May 14, 2013. The soft tissue density posteriorly in the right lower hemi thorax has increased since May 10, 2013 and likely reflects a pleural effusion or possible subpulmonic effusion.   Electronically Signed   By: David  [place]   On: 05/17/2013 16:02   05/13/2013   CLINICAL DATA:  New onset mental status changes.  Unresponsive.  EXAM: MRI HEAD WITHOUT CONTRAST  MRA HEAD WITHOUT CONTRAST  TECHNIQUE: Multiplanar, multiecho pulse sequences of the brain and surrounding structures were obtained without intravenous contrast. Angiographic images of the head were obtained using MRA technique without contrast.  COMPARISON:  CT head from the same day.  MRI brain 05/10/2013.  FINDINGS: MRI HEAD FINDINGS  There is  expected evolution of multiple prior infarcts. Multiple areas of new infarction are present including the inferior right cerebellum and superior left cerebellum. There are at least 2 focal areas in the posterior left temporal lobe. The confluent left parietal area demonstrates some increase. There is scattered new areas in the right occipital lobe and right frontal operculum.  No hemorrhage or mass lesion is present. The ventricles are proportionate to the degree of atrophy. Underlying white matter disease is stable. More confluent FLAIR changes are associated with evolving infarcts, particularly in the left frontal and parietal lobe.  Low is present in the major intracranial arteries. The globes and orbits are intact. The paranasal sinuses are clear. The right maxillary sinus is shrunken compatible with chronic disease.  MRA HEAD FINDINGS  The internal carotid arteries demonstrate similar irregularity in the cavernous carotid segments without significant stenosis. The right A1 segment is aplastic. The left A1 segment is normal. Marked attenuation of left MCA branch vessels is stable. More distal attenuation is present on the right.  The left vertebral artery is hypoplastic and terminates at the PICA. There is moderate irregularity and narrowing of the distal right vertebral artery and proximal basilar artery. The posterior cerebral arteries are of fetal type. There is moderate attenuation of distal branch vessels.  IMPRESSION: 1. Multiple new areas of acute infarction as detailed above. 2. Expected evolution of prior infarcts. 3. Moderate small vessel disease. 4. Multiple proximal stenoses within the left MCA branch vessels. 5. Atherosclerotic irregularity within the cavernous carotid arteries and distal right vertebral artery. 6. Overall stable appearance of the MRA.   Electronically Signed   By: Toribio Harbour.D.  On: 05/04/2013 21:30     Labs:  Basic Metabolic Panel:  Recent Labs Lab 05/13/13 1150  05/14/13 0600 05/15/13 0545  NA 140 141 140  K 3.6 3.3* 3.8  CL 106 108 107  CO2 23 24 25   GLUCOSE 106* 90 102*  BUN 21 19 18   CREATININE 0.69 0.72 0.82  CALCIUM 8.4 8.2* 8.2*    CBC:  Recent Labs Lab 05/15/13 0545  WBC 9.2  NEUTROABS  --   HGB 9.9*  HCT 31.4*  MCV 89.7  PLT 58*    CBG:  Recent Labs Lab 05/09/2013 1941  GLUCAP 165*    Brief HPI:   [name] is a 79 y.o. right-handed female with history of hypertension, COPD, metastatic non-small cell lung cancer with ? Liver mets as well as ?right shoulder mets (large destructive lesion of glenoid) on chemotherapy followed by Dr. Arbutus Ped. Admitted 05/10/2013 after found down with right-sided weakness, right facial droop and abnormal speech. MRI of the brain showed multiple acute subacute infarcts throughout the cerebellum bilaterally and slightly larger on the left. Supratentorial bilateral infarcts greater on the left with largest area of acute infarction involving the posterior left operculum region. MRA of the head with atherosclerotic type changes. Patient with elevated troponin due to NSTEMI with question of cardioembolic event. Patient not coumadin candidate and neurology felt that patient embolic stroke due to coagulopathy. Patient was  requiring oxygen due to hypoxia as well as narcotics for pain management of right shoulder. She was admitted to [facility] for progressive therapies.    Hospital Course: MASHAWN Gray was admitted to rehab 05/15/2013 for inpatient therapies to consist of PT, ST and OT at least three hours five days a week. She was maintained on oxygen 2-3 liters to help with dyspnea.  Follow up CXR showed some increase in effusion. Admission labs show platelets up to 71. Po intake was good and she was able to tolerate therapy evaluations that am. OT evaluation revealed that patient requiring total assist for basic self care tasks. PT evaluation showed that patient required max assist for transfers and mod to  max assist for bed mobility. Speech therapy evaluation not done as patient was off the unit for procedure. She was taken to [facility] for CT simulation that afternoon in anticipation of XRT to shoulder in the future. Later in evening, patient developed unresponsiveness and was unarousable. Code stroke was initiated and she was taken to radiology for MRI of brain. This revealed new infarctions in  inferior right cerebellum and superior left cerebellum as well as 2 focal areas in the posterior left temporal lobe. Due to decline in  LOC as well as new strokes she was transferred to acute for closer monitoring.    Disposition: To acute services.   Diet: NPO      Medication List    ASK your doctor about these medications       acetaminophen 325 MG tablet  Commonly known as:  TYLENOL  Take 2 tablets (650 mg total) by mouth every 4 (four) hours as needed for mild pain (or temp >/= 99.5 F).     aspirin 325 MG tablet  Take 1 tablet (325 mg total) by mouth daily.     atorvastatin 10 MG tablet  Commonly known as:  LIPITOR  Take 1 tablet (10 mg total) by mouth daily at 6 PM.     ciprofloxacin 500 MG tablet  Commonly known as:  CIPRO  Take 1 tablet (500 mg total)  by mouth 2 (two) times daily.     diphenoxylate-atropine 2.5-0.025 MG per tablet  Commonly known as:  LOMOTIL  Take 1 tablet by mouth 4 (four) times daily as needed for diarrhea or loose stools.     feeding supplement (ENSURE COMPLETE) Liqd  Take 237 mLs by mouth 2 (two) times daily between meals.     lidocaine-prilocaine cream  Commonly known as:  EMLA  Apply topically as needed. Use as directed     morphine 15 MG 12 hr tablet  Commonly known as:  MS CONTIN  Take 1 tablet (15 mg total) by mouth every 12 (twelve) hours.     oxyCODONE-acetaminophen 5-325 MG per tablet  Commonly known as:  PERCOCET/ROXICET  Take 1 tablet by mouth every 4 (four) hours as needed for severe pain.     prochlorperazine 10 MG tablet  Commonly known as:   COMPAZINE  Take 1 tablet (10 mg total) by mouth every 6 (six) hours as needed.     TARCEVA 100 MG tablet  Generic drug:  erlotinib  TAKE 1 TABLET BY MOUTH DAILY. TAKE ON AN EMPTY STOMACH 1 HOUR BEFORE MEALS OR 2 HOURS AFTER     traMADol 50 MG tablet  Commonly known as:  ULTRAM  Take 1 tablet (50 mg total) by mouth every 6 (six) hours as needed. Maximum dose= 8 tablets per day         Signed: Jacquelynn Cree 05/03/2013, 9:09 AM

## 2013-05-21 NOTE — Discharge Summary (Signed)
Death Summary  [name] ZOX:[id number] DOB: Nov 15, 1931 DOA: May 26, 2013  PCP: [name], MD  Admit date: 2013-05-26 Date of Death: 2013-05-28  Final Diagnoses:  Acute cerebral infarction  -likely embolic due to separate vascular territories  -neurology was consulted; appreciate neurology input-recommended to continue ASA -palliative medicine consulted whom met with family to discuss goals of care -spoke with palliative medicine team, Carlye Grippe has decided to make patient full comfort care  -Focus on comfort care  Acute encephalopathy  -No longer an active issue as the patient's care has been changed to full comfort focus  -likely due to acute stroke, but cannot rule out seizure  -continue Keppra IV for now  Atrial fibrillation  -Currently rate controlled  -No longer an active issue as the patient's care has been changed to full comfort focus  Metastatic non-small cell lung carcinoma  -as discussed, the patient's care now focused on full comfort  -Dr. Shirline Frees was oncologist Hx of recent NSTEMI  -no further workup at this time  COPD  -stable  -xopenex prn  Chronic pain  -IV morphine per palliative medicine   Hospital Course:  79 y.o. right-handed female with history of hypertension, COPD, metastatic non-small cell lung cancer with ? Liver mets as well as right shoulder mets (large destructive lesion of glenoid) on chemotherapy followed by Dr. Arbutus Ped. Admitted 05/10/2013 after found down with right-sided weakness, right facial droop and abnormal speech. 05/10/13 MRI of the brain showed multiple acute subacute infarcts throughout the cerebellum bilaterally and slightly larger on the left. Supratentorial bilateral infarcts greater on the left with largest area of acute infarction involving the posterior left operculum region. MRA of the head with atherosclerotic type changes. Echocardiogram with ejection fraction of 60% without emboli. Carotid Dopplers with no ICA  stenosis. Patient did not receive TPA. Neurology followed and patient on ASA for embolic infarcts due to hyper-coagulopathy.  Patient with elevated troponin due to NSTEMI with question of cardioembolic event. Patient not coumadin candidate and TEE would not alter treatment strategy. This was also clarified by Cardiology whom saw pt last admission. UCS positive for Escherichia coli and patient started on Cipro.  The patient was in CIR when she was found unresponsive by the nursing staff. She was subsequently transferred to the acute side of the hospital for further evaluation. 05/26/13 MRI revealed new infarcts in the inferior right and superior left cerebellum as well as the right occipital and left temporal area. There was also blood noted in the patient's mouth. Patient was started on intravenous Keppra. Neurology has followed the patient. They also recommended to continue aspirin at this time. The patient was not considered to be a good candidate for warfarin which was elevated about the patient's previous cardiology evaluation. Due to the patient's numerous comorbidities, palliative medicine was consulted to see the patient. After discussion with the patient's family, the patient's focus of care was changed to full comfort. All nonessential medications were discontinued. All the labs and x-rays were discontinued. The patient became increasingly obtunded. Secretions and respiratory distress and pain were managed appropriately. The patient subsequently expired on 05-28-13 at 0521.    The results of significant diagnostics from this hospitalization (including imaging, microbiology, ancillary and laboratory) are listed below for reference.    Significant Diagnostic Studies: Dg Chest 1 View  05/17/2013   CLINICAL DATA:  Stroke, lethargy  EXAM: CHEST - 1 VIEW  COMPARISON:  May 26, 2013  FINDINGS: Right IJ Port-A-Cath to the low SVC. Right hilar masslike consolidation. Right  lung volume loss with elevated  hemidiaphragm and probable subpulmonic effusion. Heart size remains normal. Left lung clear. Regional bones unremarkable.  IMPRESSION: Stable right hilar masslike consolidation with probable effusion.   Electronically Signed   By: Oley Balm M.D.   On: 05/17/2013 08:10   Dg Chest 2 View  06-01-2013   CLINICAL DATA:  Follow-up of pleural effusion.  EXAM: CHEST  2 VIEW  COMPARISON:  May 14, 2013 chest x-ray  FINDINGS: There remains increased density posteriorly in the right hemi thorax not significantly changed from the earlier study. This may reflect a loculated pleural effusion or subpulmonic effusion as previously suggested. This has increased in conspicuity since the study of May 10, 2013. There remain mildly increased interstitial markings in the right lung which are unchanged. Similar findings are noted on the left. The left lung is better aerated than is the right. The cardiopericardial silhouette is not enlarged. The Port-A-Cath type appliance is unchanged in appearance. The observed portions of the bony thorax exhibit no acute abnormalities.  IMPRESSION: There is no significant change in the appearance of the right hemi thorax since the study of May 14, 2013. The soft tissue density posteriorly in the right lower hemi thorax has increased since May 10, 2013 and likely reflects a pleural effusion or possible subpulmonic effusion.   Electronically Signed   By: Jac Romulus  [place]   On: 06/01/13 16:02   Dg Chest 2 View  05/14/2013   CLINICAL DATA:  Possible apical pneumothorax on shoulder x-ray.  EXAM: CHEST  2 VIEW  COMPARISON:  Right shoulder radiographs 05/14/2013 and chest radiographs 04/2013.  FINDINGS: Right jugular Port-A-Cath remains in place with tip overlying the mid to lower SVC, unchanged. The right lung is diminished in volume. There is new opacity in the right lung base which may reflect a subpulmonic effusion. Increased density in the right hilum is stable to slightly  increased from prior chest radiograph. No definite pneumothorax is identified. The left lung is well inflated and clear. Calcific supraspinatus tendinosis is again noted.  IMPRESSION: 1. No definite pneumothorax identified. 2. Increased opacity in the right lung base, which may reflect a subpulmonic effusion. 3. Stable to mildly increased right hilar opacity in the setting of treated lung cancer.   Electronically Signed   By: Sebastian Ache   On: 05/14/2013 17:45   Dg Chest 2 View  05/11/2013   CLINICAL DATA:  Stroke and right-sided weakness.  EXAM: CHEST  2 VIEW  COMPARISON:  Chest CT 02/15/2013  FINDINGS: Right IJ porta catheter in stable position. Right perihilar mass with surrounding interstitial changes in this patient with treated lung cancer. No recent radiography for direct comparison. Small right pleural effusion which is chronic. Left lung is well-aerated.  IMPRESSION: 1. No active cardiopulmonary disease. 2. Chronic, small right pleural effusion. 3. Treated right lung cancer which is followed by CT.   Electronically Signed   By: Tiburcio Pea M.D.   On: 05/11/2013 05:31   Dg Shoulder Right  05/14/2013   CLINICAL DATA:  Pain.  Metastatic cancer.  EXAM: RIGHT SHOULDER - 2+ VIEW  COMPARISON:  Chest x-ray 05/10/2013.  FINDINGS: Port-A-Cath noted. Right medial lung field changes suggesting prior radiation therapy. A subtle right apical pneumothorax cannot be excluded.  Lucency is noted in the right scapula in the region of the upper bony glenoid. This is suspicious for lytic metastatic focus. Further evaluation with CT or MRI can be obtained. PET-CT can be obtained. No acute bony abnormality  identified. There are degenerative changes present about the right shoulder. Evidence of calcific supraspinatus tendinitis noted.  IMPRESSION: 1. Possible lytic lesion in the right scapula in the region of the bony glenoid. Further evaluation with CT or MRI can be obtained. PET-CT can be obtained . 2. Post  radiation therapy changes projected over the right mid lung field. 3. Port-A-Cath noted in good anatomic position. A subtle right apical pneumothorax cannot be excluded. These results were called by telephone at the time of interpretation on 05/14/2013 at 3:55 PM to Dr. Cyndie Mull , who verbally acknowledged these results.   Electronically Signed   By: Maisie Fus  Register   On: 05/14/2013 15:59   Ct Head Wo Contrast  05/02/2013   CLINICAL DATA:  Right-sided weakness.  Found unresponsive.  EXAM: CT HEAD WITHOUT CONTRAST  TECHNIQUE: Contiguous axial images were obtained from the base of the skull through the vertex without intravenous contrast.  COMPARISON:  MRI brain 05/10/2013.  FINDINGS: Expected evolution of multiple subacute infarcts is evident. This is most evident in the posterior left frontal and parietal lobe. Involving infarcts are present in the cerebellum bilaterally as well. There is no hemorrhage. No definite new infarct is present. Given the extent of previous infarcts, some progression cannot be excluded.  The ventricles are of normal size. No significant extra-axial fluid collection is present.  The paranasal sinuses and mastoid air cells are clear. The right maxillary sinus is shrunken, compatible with chronic disease. No active disease is evident.  IMPRESSION: 1. Expected evolution of multiple infarcts with most confluent area involving posterior left frontal lobe and parietal lobe. 2. No definite new infarct or hemorrhage.   Electronically Signed   By: Gennette Pac M.D.   On: 04/22/2013 20:03   Ct Head Wo Contrast  05/10/2013   CLINICAL DATA:  Weakness  EXAM: CT HEAD WITHOUT CONTRAST  CT CERVICAL SPINE WITHOUT CONTRAST  TECHNIQUE: Multidetector CT imaging of the head and cervical spine was performed following the standard protocol without intravenous contrast. Multiplanar CT image reconstructions of the cervical spine were also generated.  COMPARISON:  MRI 09/30/2011  FINDINGS: CT HEAD FINDINGS   Generalized atrophy. Chronic microvascular ischemic change in the white matter. Chronic infarct left cerebellum not present previously.  Negative for acute infarct, hemorrhage, or mass lesion. No edema or midline shift. Negative for skull lesion.  CT CERVICAL SPINE FINDINGS  Normal cervical alignment. Negative for fracture or mass lesion. Mild disc and mild facet degeneration.  IMPRESSION: Atrophy and chronic ischemia.  No acute abnormality.  Mild cervical degenerative change.  No acute bony abnormality.   Electronically Signed   By: Marlan Palau M.D.   On: 05/10/2013 09:04   Ct Cervical Spine Wo Contrast  05/10/2013   CLINICAL DATA:  Weakness  EXAM: CT HEAD WITHOUT CONTRAST  CT CERVICAL SPINE WITHOUT CONTRAST  TECHNIQUE: Multidetector CT imaging of the head and cervical spine was performed following the standard protocol without intravenous contrast. Multiplanar CT image reconstructions of the cervical spine were also generated.  COMPARISON:  MRI 09/30/2011  FINDINGS: CT HEAD FINDINGS  Generalized atrophy. Chronic microvascular ischemic change in the white matter. Chronic infarct left cerebellum not present previously.  Negative for acute infarct, hemorrhage, or mass lesion. No edema or midline shift. Negative for skull lesion.  CT CERVICAL SPINE FINDINGS  Normal cervical alignment. Negative for fracture or mass lesion. Mild disc and mild facet degeneration.  IMPRESSION: Atrophy and chronic ischemia.  No acute abnormality.  Mild cervical degenerative  change.  No acute bony abnormality.   Electronically Signed   By: Marlan Palau M.D.   On: 05/10/2013 09:04   Mr Maxine Glenn Head Wo Contrast  04/22/2013   CLINICAL DATA:  New onset mental status changes.  Unresponsive.  EXAM: MRI HEAD WITHOUT CONTRAST  MRA HEAD WITHOUT CONTRAST  TECHNIQUE: Multiplanar, multiecho pulse sequences of the brain and surrounding structures were obtained without intravenous contrast. Angiographic images of the head were obtained using MRA  technique without contrast.  COMPARISON:  CT head from the same day.  MRI brain 05/10/2013.  FINDINGS: MRI HEAD FINDINGS  There is expected evolution of multiple prior infarcts. Multiple areas of new infarction are present including the inferior right cerebellum and superior left cerebellum. There are at least 2 focal areas in the posterior left temporal lobe. The confluent left parietal area demonstrates some increase. There is scattered new areas in the right occipital lobe and right frontal operculum.  No hemorrhage or mass lesion is present. The ventricles are proportionate to the degree of atrophy. Underlying white matter disease is stable. More confluent FLAIR changes are associated with evolving infarcts, particularly in the left frontal and parietal lobe.  Low is present in the major intracranial arteries. The globes and orbits are intact. The paranasal sinuses are clear. The right maxillary sinus is shrunken compatible with chronic disease.  MRA HEAD FINDINGS  The internal carotid arteries demonstrate similar irregularity in the cavernous carotid segments without significant stenosis. The right A1 segment is aplastic. The left A1 segment is normal. Marked attenuation of left MCA branch vessels is stable. More distal attenuation is present on the right.  The left vertebral artery is hypoplastic and terminates at the PICA. There is moderate irregularity and narrowing of the distal right vertebral artery and proximal basilar artery. The posterior cerebral arteries are of fetal type. There is moderate attenuation of distal branch vessels.  IMPRESSION: 1. Multiple new areas of acute infarction as detailed above. 2. Expected evolution of prior infarcts. 3. Moderate small vessel disease. 4. Multiple proximal stenoses within the left MCA branch vessels. 5. Atherosclerotic irregularity within the cavernous carotid arteries and distal right vertebral artery. 6. Overall stable appearance of the MRA.   Electronically  Signed   By: Gennette Pac M.D.   On: 04/27/2013 21:30   Mr Maxine Glenn Head Wo Contrast  05/10/2013   CLINICAL DATA:  Weakness.  History of lung cancer.  EXAM: MRI HEAD WITHOUT CONTRAST  MRA HEAD WITHOUT CONTRAST  TECHNIQUE: Multiplanar, multiecho pulse sequences of the brain and surrounding structures were obtained without intravenous contrast. Angiographic images of the head were obtained using MRA technique without contrast.  COMPARISON:  05/10/2013 CT.  09/30/2011 brain MR.  FINDINGS: MRI HEAD FINDINGS  Multiple acute/subacute infarcts throughout the cerebellum bilaterally and slightly larger on the left. Supratentorial bilateral infarcts greater on the left with largest area of acute infarction involving the posterior left operculum region. There is a parasagittal distribution of some of the infarcts involving the frontal-parietal lobe which may indicate a component of watershed infarction. However, with involvement of multiple vascular territories, embolus is of concern.  No intracranial hemorrhage.  Mild small vessel disease type changes.  Global atrophy without hydrocephalus.  No intracranial mass lesion noted on this unenhanced exam.  MRA HEAD FINDINGS  Ectatic distal vertical cervical segment of the left internal carotid artery. No significant stenosis of either internal carotid artery or carotid terminus.  Aplastic A1 segment right anterior cerebral artery.  No significant stenosis  of the M1 segment of the middle cerebral artery on either side.  Decrease number of visualized middle cerebral artery branch vessels. The middle cerebral artery branches which are visualized are narrowed and irregular with narrowing more notable on the left.  Moderate stenosis portions of the A2 segment of the right anterior cerebral artery.  Fetal type contribution to the posterior circulation.  Left vertebral artery is small and ends in a posterior inferior cerebellar artery distribution. Mild to moderate narrowing of portions  of the distal left vertebral artery and the left posterior inferior cerebellar artery.  Mild to moderate narrowing of the distal right vertebral artery and proximal basilar artery. Mild narrowing of portions of the right posterior inferior cerebellar artery.  Non visualization of the anterior inferior cerebellar arteries.  Moderate narrowing superior cerebral artery bilaterally.  There is a bulge along the superior margin of the the basilar tip. This may reflect result of fetal type origin of the posterior cerebral arteries. Small aneurysm at this level is not excluded although felt less likely consideration.  Mild to moderate narrowing distal branches of the posterior cerebral artery bilaterally.  IMPRESSION: Multiple acute/subacute infarcts throughout the cerebellum bilaterally and slightly larger on the left. Supratentorial bilateral infarcts greater on the left with largest area of acute infarction involving the posterior left operculum region. There is a parasagittal distribution of some of the infarcts involving the frontal-parietal lobe which may indicate a component of watershed infarction. However, with involvement of multiple vascular territories, embolus is of concern.  Intracranial atherosclerotic type changes as noted above most notable involving branch vessels and posterior circulation.  These results were called by telephone at the time of interpretation on 05/10/2013 at 2:27 PM to Dr. Linwood Dibbles , who verbally acknowledged these results.   Electronically Signed   By: Bridgett Larsson M.D.   On: 05/10/2013 14:37   Mr Brain Wo Contrast  04/30/2013   CLINICAL DATA:  New onset mental status changes.  Unresponsive.  EXAM: MRI HEAD WITHOUT CONTRAST  MRA HEAD WITHOUT CONTRAST  TECHNIQUE: Multiplanar, multiecho pulse sequences of the brain and surrounding structures were obtained without intravenous contrast. Angiographic images of the head were obtained using MRA technique without contrast.  COMPARISON:  CT  head from the same day.  MRI brain 05/10/2013.  FINDINGS: MRI HEAD FINDINGS  There is expected evolution of multiple prior infarcts. Multiple areas of new infarction are present including the inferior right cerebellum and superior left cerebellum. There are at least 2 focal areas in the posterior left temporal lobe. The confluent left parietal area demonstrates some increase. There is scattered new areas in the right occipital lobe and right frontal operculum.  No hemorrhage or mass lesion is present. The ventricles are proportionate to the degree of atrophy. Underlying white matter disease is stable. More confluent FLAIR changes are associated with evolving infarcts, particularly in the left frontal and parietal lobe.  Low is present in the major intracranial arteries. The globes and orbits are intact. The paranasal sinuses are clear. The right maxillary sinus is shrunken compatible with chronic disease.  MRA HEAD FINDINGS  The internal carotid arteries demonstrate similar irregularity in the cavernous carotid segments without significant stenosis. The right A1 segment is aplastic. The left A1 segment is normal. Marked attenuation of left MCA branch vessels is stable. More distal attenuation is present on the right.  The left vertebral artery is hypoplastic and terminates at the PICA. There is moderate irregularity and narrowing of the distal right vertebral  artery and proximal basilar artery. The posterior cerebral arteries are of fetal type. There is moderate attenuation of distal branch vessels.  IMPRESSION: 1. Multiple new areas of acute infarction as detailed above. 2. Expected evolution of prior infarcts. 3. Moderate small vessel disease. 4. Multiple proximal stenoses within the left MCA branch vessels. 5. Atherosclerotic irregularity within the cavernous carotid arteries and distal right vertebral artery. 6. Overall stable appearance of the MRA.   Electronically Signed   By: Gennette Pac M.D.   On:  04/21/2013 21:30   Mr Laqueta Jean ZO Contrast  05/10/2013   CLINICAL DATA:  Weakness.  History of lung cancer.  EXAM: MRI HEAD WITHOUT CONTRAST  MRA HEAD WITHOUT CONTRAST  TECHNIQUE: Multiplanar, multiecho pulse sequences of the brain and surrounding structures were obtained without intravenous contrast. Angiographic images of the head were obtained using MRA technique without contrast.  COMPARISON:  05/10/2013 CT.  09/30/2011 brain MR.  FINDINGS: MRI HEAD FINDINGS  Multiple acute/subacute infarcts throughout the cerebellum bilaterally and slightly larger on the left. Supratentorial bilateral infarcts greater on the left with largest area of acute infarction involving the posterior left operculum region. There is a parasagittal distribution of some of the infarcts involving the frontal-parietal lobe which may indicate a component of watershed infarction. However, with involvement of multiple vascular territories, embolus is of concern.  No intracranial hemorrhage.  Mild small vessel disease type changes.  Global atrophy without hydrocephalus.  No intracranial mass lesion noted on this unenhanced exam.  MRA HEAD FINDINGS  Ectatic distal vertical cervical segment of the left internal carotid artery. No significant stenosis of either internal carotid artery or carotid terminus.  Aplastic A1 segment right anterior cerebral artery.  No significant stenosis of the M1 segment of the middle cerebral artery on either side.  Decrease number of visualized middle cerebral artery branch vessels. The middle cerebral artery branches which are visualized are narrowed and irregular with narrowing more notable on the left.  Moderate stenosis portions of the A2 segment of the right anterior cerebral artery.  Fetal type contribution to the posterior circulation.  Left vertebral artery is small and ends in a posterior inferior cerebellar artery distribution. Mild to moderate narrowing of portions of the distal left vertebral artery and  the left posterior inferior cerebellar artery.  Mild to moderate narrowing of the distal right vertebral artery and proximal basilar artery. Mild narrowing of portions of the right posterior inferior cerebellar artery.  Non visualization of the anterior inferior cerebellar arteries.  Moderate narrowing superior cerebral artery bilaterally.  There is a bulge along the superior margin of the the basilar tip. This may reflect result of fetal type origin of the posterior cerebral arteries. Small aneurysm at this level is not excluded although felt less likely consideration.  Mild to moderate narrowing distal branches of the posterior cerebral artery bilaterally.  IMPRESSION: Multiple acute/subacute infarcts throughout the cerebellum bilaterally and slightly larger on the left. Supratentorial bilateral infarcts greater on the left with largest area of acute infarction involving the posterior left operculum region. There is a parasagittal distribution of some of the infarcts involving the frontal-parietal lobe which may indicate a component of watershed infarction. However, with involvement of multiple vascular territories, embolus is of concern.  Intracranial atherosclerotic type changes as noted above most notable involving branch vessels and posterior circulation.  These results were called by telephone at the time of interpretation on 05/10/2013 at 2:27 PM to Dr. Linwood Dibbles , who verbally acknowledged these results.  Electronically Signed   By: Bridgett Larsson M.D.   On: 05/10/2013 14:37    Microbiology: Recent Results (from the past 240 hour(s))  URINE CULTURE     Status: None   Collection Time    05/10/13  9:11 AM      Result Value Range Status   Specimen Description URINE, CLEAN CATCH   Final   Special Requests NONE   Final   Culture  Setup Time     Final   Value: 05/10/2013 10:10     Performed at [facility] Count     Final   Value: >=100,000 COLONIES/ML     Performed at [facility]   Culture     Final   Value: ESCHERICHIA COLI     Performed at [facility]   Report Status 05/12/2013 FINAL   Final   Organism ID, Bacteria ESCHERICHIA COLI   Final     Labs: Basic Metabolic Panel:  Recent Labs Lab 05/13/13 1150 05/14/13 0600 05/15/13 0545 04/27/2013 0545 05/17/13 0500  NA 140 141 140 142 140  K 3.6 3.3* 3.8 3.5 3.6  CL 106 108 107 106 104  CO2 23 24 25 25 26   GLUCOSE 106* 90 102* 96 94  BUN 21 19 18 19 18   CREATININE 0.69 0.72 0.82 0.88 0.73  CALCIUM 8.4 8.2* 8.2* 8.3* 8.3*   Liver Function Tests:  Recent Labs Lab 05/17/13 0500  AST 90*  ALT 107*  ALKPHOS 303*  BILITOT 1.3*  PROT 4.7*  ALBUMIN 1.9*   No results found for this basename: LIPASE, AMYLASE,  in the last 168 hours No results found for this basename: AMMONIA,  in the last 168 hours CBC:  Recent Labs Lab 05/13/13 1150 05/14/13 0600 05/15/13 0545 04/28/2013 0545 05/17/13 0500  WBC 11.3* 8.6 9.2 8.0 8.3  NEUTROABS  --   --   --   --  7.1  HGB 11.4* 10.5* 9.9* 10.2* 9.7*  HCT 35.4* 32.1* 31.4* 31.1* 30.1*  MCV 88.7 88.7 89.7 87.6 88.5  PLT 75* 73* 58* 71* 61*   Cardiac Enzymes: No results found for this basename: CKTOTAL, CKMB, CKMBINDEX, TROPONINI,  in the last 168 hours BNP: No components found with this basename: POCBNP,  CBG:  Recent Labs Lab 05/10/2013 1941  GLUCAP 165*    Time: 1610  Signed: Onalee Hua Ernesto Lashway  [facility] 06-16-13, 5:50 PM

## 2013-05-21 DEATH — deceased

## 2013-05-22 ENCOUNTER — Ambulatory Visit: Payer: Medicare Other | Admitting: Radiation Oncology

## 2013-05-22 ENCOUNTER — Other Ambulatory Visit: Payer: Medicare Other | Admitting: Lab

## 2013-05-22 ENCOUNTER — Ambulatory Visit: Payer: Medicare Other | Admitting: Internal Medicine

## 2013-06-06 NOTE — Consult Note (Signed)
I have reviewed and discussed the care of this patient in detail with the nurse practitioner including pertinent patient records, physical exam findings and data. I agree with details of this encounter.  

## 2013-06-10 ENCOUNTER — Encounter: Payer: Self-pay | Admitting: Radiation Oncology

## 2014-01-10 ENCOUNTER — Encounter (HOSPITAL_COMMUNITY): Payer: Medicare Other

## 2014-01-10 ENCOUNTER — Ambulatory Visit: Payer: Medicare Other | Admitting: Vascular Surgery

## 2014-02-15 ENCOUNTER — Other Ambulatory Visit: Payer: Self-pay | Admitting: Pharmacist
